# Patient Record
Sex: Female | Born: 1950
Health system: Southern US, Community
[De-identification: ages and names within clinical notes are randomized; demographics above are authoritative.]

## PROBLEM LIST (undated history)

## (undated) DIAGNOSIS — IMO0002 Reserved for concepts with insufficient information to code with codable children: Secondary | ICD-10-CM

## (undated) DIAGNOSIS — M8580 Other specified disorders of bone density and structure, unspecified site: Secondary | ICD-10-CM

## (undated) DIAGNOSIS — R0789 Other chest pain: Secondary | ICD-10-CM

## (undated) DIAGNOSIS — I82409 Acute embolism and thrombosis of unspecified deep veins of unspecified lower extremity: Secondary | ICD-10-CM

## (undated) DIAGNOSIS — M1990 Unspecified osteoarthritis, unspecified site: Secondary | ICD-10-CM

## (undated) DIAGNOSIS — K219 Gastro-esophageal reflux disease without esophagitis: Secondary | ICD-10-CM

## (undated) DIAGNOSIS — K649 Unspecified hemorrhoids: Secondary | ICD-10-CM

## (undated) DIAGNOSIS — K824 Cholesterolosis of gallbladder: Secondary | ICD-10-CM

## (undated) DIAGNOSIS — Z5189 Encounter for other specified aftercare: Secondary | ICD-10-CM

## (undated) DIAGNOSIS — R3129 Other microscopic hematuria: Secondary | ICD-10-CM

## (undated) DIAGNOSIS — T4145XA Adverse effect of unspecified anesthetic, initial encounter: Secondary | ICD-10-CM

## (undated) DIAGNOSIS — I739 Peripheral vascular disease, unspecified: Secondary | ICD-10-CM

## (undated) DIAGNOSIS — Z9889 Other specified postprocedural states: Secondary | ICD-10-CM

## (undated) DIAGNOSIS — C4491 Basal cell carcinoma of skin, unspecified: Secondary | ICD-10-CM

## (undated) DIAGNOSIS — D696 Thrombocytopenia, unspecified: Secondary | ICD-10-CM

## (undated) DIAGNOSIS — T7840XA Allergy, unspecified, initial encounter: Secondary | ICD-10-CM

## (undated) DIAGNOSIS — K602 Anal fissure, unspecified: Secondary | ICD-10-CM

## (undated) DIAGNOSIS — T8859XA Other complications of anesthesia, initial encounter: Secondary | ICD-10-CM

## (undated) DIAGNOSIS — R112 Nausea with vomiting, unspecified: Secondary | ICD-10-CM

## (undated) HISTORY — DX: Other specified disorders of bone density and structure, unspecified site: M85.80

## (undated) HISTORY — PX: LUNG BIOPSY: SHX232

## (undated) HISTORY — DX: Allergy, unspecified, initial encounter: T78.40XA

## (undated) HISTORY — DX: Thrombocytopenia, unspecified: D69.6

## (undated) HISTORY — DX: Unspecified hemorrhoids: K64.9

## (undated) HISTORY — DX: Reserved for concepts with insufficient information to code with codable children: IMO0002

## (undated) HISTORY — DX: Basal cell carcinoma of skin, unspecified: C44.91

## (undated) HISTORY — DX: Other chest pain: R07.89

## (undated) HISTORY — PX: COLONOSCOPY: SHX174

## (undated) HISTORY — PX: JOINT REPLACEMENT: SHX530

## (undated) HISTORY — DX: Cholesterolosis of gallbladder: K82.4

## (undated) HISTORY — DX: Anal fissure, unspecified: K60.2

## (undated) HISTORY — DX: Peripheral vascular disease, unspecified: I73.9

## (undated) HISTORY — DX: Encounter for other specified aftercare: Z51.89

## (undated) HISTORY — DX: Other microscopic hematuria: R31.29

## (undated) HISTORY — DX: Gastro-esophageal reflux disease without esophagitis: K21.9

---

## 2001-08-29 ENCOUNTER — Other Ambulatory Visit: Admission: RE | Admit: 2001-08-29 | Discharge: 2001-08-29 | Payer: Self-pay | Admitting: *Deleted

## 2002-09-19 ENCOUNTER — Other Ambulatory Visit: Admission: RE | Admit: 2002-09-19 | Discharge: 2002-09-19 | Payer: Self-pay | Admitting: Obstetrics & Gynecology

## 2003-10-22 ENCOUNTER — Other Ambulatory Visit: Admission: RE | Admit: 2003-10-22 | Discharge: 2003-10-22 | Payer: Self-pay | Admitting: Obstetrics & Gynecology

## 2003-11-21 ENCOUNTER — Encounter: Admission: RE | Admit: 2003-11-21 | Discharge: 2003-11-21 | Payer: Self-pay | Admitting: Family Medicine

## 2004-02-19 ENCOUNTER — Encounter: Admission: RE | Admit: 2004-02-19 | Discharge: 2004-02-19 | Payer: Self-pay | Admitting: Family Medicine

## 2004-03-21 ENCOUNTER — Ambulatory Visit (HOSPITAL_COMMUNITY): Admission: RE | Admit: 2004-03-21 | Discharge: 2004-03-21 | Payer: Self-pay | Admitting: Gastroenterology

## 2004-03-21 ENCOUNTER — Encounter: Payer: Self-pay | Admitting: Gastroenterology

## 2004-07-01 ENCOUNTER — Encounter: Admission: RE | Admit: 2004-07-01 | Discharge: 2004-07-01 | Payer: Self-pay | Admitting: *Deleted

## 2004-12-31 ENCOUNTER — Encounter: Admission: RE | Admit: 2004-12-31 | Discharge: 2004-12-31 | Payer: Self-pay | Admitting: *Deleted

## 2005-11-26 ENCOUNTER — Encounter: Admission: RE | Admit: 2005-11-26 | Discharge: 2005-11-26 | Payer: Self-pay | Admitting: *Deleted

## 2005-12-08 ENCOUNTER — Ambulatory Visit (HOSPITAL_COMMUNITY): Admission: RE | Admit: 2005-12-08 | Discharge: 2005-12-08 | Payer: Self-pay | Admitting: Family Medicine

## 2006-01-15 ENCOUNTER — Ambulatory Visit: Payer: Self-pay | Admitting: Internal Medicine

## 2006-01-21 ENCOUNTER — Ambulatory Visit (HOSPITAL_COMMUNITY): Admission: RE | Admit: 2006-01-21 | Discharge: 2006-01-21 | Payer: Self-pay | Admitting: Internal Medicine

## 2006-02-04 ENCOUNTER — Inpatient Hospital Stay (HOSPITAL_COMMUNITY)
Admission: RE | Admit: 2006-02-04 | Discharge: 2006-02-07 | Payer: Self-pay | Admitting: Thoracic Surgery (Cardiothoracic Vascular Surgery)

## 2006-02-04 ENCOUNTER — Encounter (INDEPENDENT_AMBULATORY_CARE_PROVIDER_SITE_OTHER): Payer: Self-pay | Admitting: Specialist

## 2006-02-12 ENCOUNTER — Encounter
Admission: RE | Admit: 2006-02-12 | Discharge: 2006-02-12 | Payer: Self-pay | Admitting: Thoracic Surgery (Cardiothoracic Vascular Surgery)

## 2006-08-17 ENCOUNTER — Ambulatory Visit: Payer: Self-pay | Admitting: Internal Medicine

## 2006-08-24 ENCOUNTER — Ambulatory Visit: Payer: Self-pay | Admitting: Internal Medicine

## 2006-09-15 ENCOUNTER — Ambulatory Visit: Payer: Self-pay | Admitting: Internal Medicine

## 2006-10-08 ENCOUNTER — Ambulatory Visit: Payer: Self-pay | Admitting: Internal Medicine

## 2006-10-08 LAB — CONVERTED CEMR LAB
Basophils Absolute: 0 10*3/uL (ref 0.0–0.1)
Basophils Relative: 0.3 % (ref 0.0–1.0)
Eosinophil percent: 3.1 % (ref 0.0–5.0)
HCT: 42.5 % (ref 36.0–46.0)
Hemoglobin: 14.4 g/dL (ref 12.0–15.0)
Lymphocytes Relative: 40.5 % (ref 12.0–46.0)
MCHC: 33.8 g/dL (ref 30.0–36.0)
MCV: 95.3 fL (ref 78.0–100.0)
Monocytes Absolute: 0.2 10*3/uL (ref 0.2–0.7)
Monocytes Relative: 5.4 % (ref 3.0–11.0)
Neutro Abs: 1.8 10*3/uL (ref 1.4–7.7)
Neutrophils Relative %: 50.7 % (ref 43.0–77.0)
Platelets: 164 10*3/uL (ref 150–400)
RBC: 4.46 M/uL (ref 3.87–5.11)
RDW: 12 % (ref 11.5–14.6)
WBC: 3.6 10*3/uL — ABNORMAL LOW (ref 4.5–10.5)

## 2006-11-02 ENCOUNTER — Ambulatory Visit: Payer: Self-pay | Admitting: Internal Medicine

## 2006-11-14 ENCOUNTER — Ambulatory Visit: Payer: Self-pay | Admitting: Hematology & Oncology

## 2006-11-19 ENCOUNTER — Ambulatory Visit: Payer: Self-pay | Admitting: Internal Medicine

## 2006-11-19 LAB — CONVERTED CEMR LAB
BUN: 6 mg/dL (ref 6–23)
CO2: 30 meq/L (ref 19–32)
Calcium: 9.3 mg/dL (ref 8.4–10.5)
Chloride: 104 meq/L (ref 96–112)
Creatinine, Ser: 0.7 mg/dL (ref 0.4–1.2)
GFR calc non Af Amer: 93 mL/min
Glomerular Filtration Rate, Af Am: 113 mL/min/{1.73_m2}
Glucose, Bld: 79 mg/dL (ref 70–99)
Potassium: 3.7 meq/L (ref 3.5–5.1)
Sodium: 140 meq/L (ref 135–145)

## 2006-12-08 LAB — CBC WITH DIFFERENTIAL/PLATELET
Eosinophils Absolute: 0.1 10*3/uL (ref 0.0–0.5)
LYMPH%: 37.1 % (ref 14.0–48.0)
MCV: 94 fL (ref 81.0–101.0)
MONO%: 5 % (ref 0.0–13.0)
NEUT#: 2.2 10*3/uL (ref 1.5–6.5)
NEUT%: 56.1 % (ref 39.6–76.8)
Platelets: 170 10*3/uL (ref 145–400)
RBC: 4.43 10*6/uL (ref 3.70–5.32)

## 2006-12-08 LAB — CHCC SMEAR

## 2006-12-09 LAB — RHEUMATOID FACTOR: Rhuematoid fact SerPl-aCnc: <20 IU/mL (ref 0–20)

## 2007-06-09 ENCOUNTER — Ambulatory Visit: Payer: Self-pay | Admitting: Internal Medicine

## 2007-06-09 LAB — CONVERTED CEMR LAB
BUN: 9 mg/dL (ref 6–23)
CO2: 33 meq/L — ABNORMAL HIGH (ref 19–32)
Calcium: 8.9 mg/dL (ref 8.4–10.5)
Chloride: 103 meq/L (ref 96–112)
Creatinine, Ser: 0.7 mg/dL (ref 0.4–1.2)
GFR calc Af Amer: 112 mL/min
GFR calc non Af Amer: 93 mL/min
Glucose, Bld: 82 mg/dL (ref 70–99)
Potassium: 4.1 meq/L (ref 3.5–5.1)
Sodium: 141 meq/L (ref 135–145)
Vit D, 1,25-Dihydroxy: 24 (ref 20–57)

## 2007-09-28 ENCOUNTER — Ambulatory Visit: Payer: Self-pay | Admitting: Internal Medicine

## 2007-12-01 HISTORY — PX: UPPER GASTROINTESTINAL ENDOSCOPY: SHX188

## 2008-01-05 ENCOUNTER — Encounter: Payer: Self-pay | Admitting: Internal Medicine

## 2008-01-06 ENCOUNTER — Encounter: Payer: Self-pay | Admitting: Internal Medicine

## 2008-01-12 ENCOUNTER — Ambulatory Visit: Payer: Self-pay | Admitting: Internal Medicine

## 2008-01-12 LAB — CONVERTED CEMR LAB
ALT: 14 units/L (ref 0–35)
AST: 16 units/L (ref 0–37)
Albumin: 3.9 g/dL (ref 3.5–5.2)
Alkaline Phosphatase: 36 units/L — ABNORMAL LOW (ref 39–117)
BUN: 9 mg/dL (ref 6–23)
Basophils Absolute: 0 10*3/uL (ref 0.0–0.1)
Basophils Relative: 0.3 % (ref 0.0–1.0)
Bilirubin Urine: NEGATIVE
Bilirubin, Direct: 0.1 mg/dL (ref 0.0–0.3)
CO2: 32 meq/L (ref 19–32)
Calcium: 8.9 mg/dL (ref 8.4–10.5)
Chloride: 106 meq/L (ref 96–112)
Cholesterol: 151 mg/dL (ref 0–200)
Creatinine, Ser: 0.7 mg/dL (ref 0.4–1.2)
Eosinophils Absolute: 0.1 10*3/uL (ref 0.0–0.6)
Eosinophils Relative: 2.8 % (ref 0.0–5.0)
GFR calc Af Amer: 112 mL/min
GFR calc non Af Amer: 93 mL/min
Glucose, Bld: 85 mg/dL (ref 70–99)
HCT: 42.5 % (ref 36.0–46.0)
HDL: 46.9 mg/dL (ref >39.0)
Hemoglobin, Urine: NEGATIVE
Hemoglobin: 14.3 g/dL (ref 12.0–15.0)
Ketones, ur: NEGATIVE mg/dL
LDL Cholesterol: 95 mg/dL (ref 0–99)
Leukocytes, UA: NEGATIVE
Lymphocytes Relative: 35.3 % (ref 12.0–46.0)
MCHC: 33.7 g/dL (ref 30.0–36.0)
MCV: 96.5 fL (ref 78.0–100.0)
Monocytes Absolute: 0.2 10*3/uL (ref 0.2–0.7)
Monocytes Relative: 5 % (ref 3.0–11.0)
Neutro Abs: 2.4 10*3/uL (ref 1.4–7.7)
Neutrophils Relative %: 56.6 % (ref 43.0–77.0)
Nitrite: NEGATIVE
Platelets: 150 10*3/uL (ref 150–400)
Potassium: 4.3 meq/L (ref 3.5–5.1)
RBC: 4.41 M/uL (ref 3.87–5.11)
RDW: 12.3 % (ref 11.5–14.6)
Sodium: 142 meq/L (ref 135–145)
Specific Gravity, Urine: 1.01 (ref 1.000–1.03)
TSH: 1.5 microintl units/mL (ref 0.35–5.50)
Total Bilirubin: 0.8 mg/dL (ref 0.3–1.2)
Total CHOL/HDL Ratio: 3.2
Total Protein, Urine: NEGATIVE mg/dL
Total Protein: 6.3 g/dL (ref 6.0–8.3)
Triglycerides: 44 mg/dL (ref 0–149)
Urine Glucose: NEGATIVE mg/dL
Urobilinogen, UA: 0.2 (ref 0.0–1.0)
VLDL: 9 mg/dL (ref 0–40)
WBC: 4.1 10*3/uL — ABNORMAL LOW (ref 4.5–10.5)
pH: 7 (ref 5.0–8.0)

## 2008-01-18 ENCOUNTER — Ambulatory Visit: Payer: Self-pay | Admitting: Internal Medicine

## 2008-01-18 DIAGNOSIS — H8110 Benign paroxysmal vertigo, unspecified ear: Secondary | ICD-10-CM | POA: Insufficient documentation

## 2008-01-19 LAB — CONVERTED CEMR LAB: Vit D, 1,25-Dihydroxy: 32 (ref 30–89)

## 2008-02-01 ENCOUNTER — Encounter: Payer: Self-pay | Admitting: Internal Medicine

## 2008-02-21 LAB — CONVERTED CEMR LAB: Pap Smear: NORMAL

## 2008-07-09 ENCOUNTER — Telehealth: Payer: Self-pay | Admitting: Internal Medicine

## 2008-07-10 ENCOUNTER — Ambulatory Visit: Payer: Self-pay | Admitting: Internal Medicine

## 2008-07-10 DIAGNOSIS — K219 Gastro-esophageal reflux disease without esophagitis: Secondary | ICD-10-CM | POA: Insufficient documentation

## 2008-08-14 ENCOUNTER — Ambulatory Visit: Payer: Self-pay | Admitting: Gastroenterology

## 2008-08-22 ENCOUNTER — Ambulatory Visit: Payer: Self-pay | Admitting: Gastroenterology

## 2008-08-22 ENCOUNTER — Encounter: Payer: Self-pay | Admitting: Internal Medicine

## 2008-08-22 ENCOUNTER — Encounter: Payer: Self-pay | Admitting: Gastroenterology

## 2008-08-23 ENCOUNTER — Encounter: Payer: Self-pay | Admitting: Gastroenterology

## 2008-09-18 ENCOUNTER — Ambulatory Visit: Payer: Self-pay | Admitting: Internal Medicine

## 2008-09-18 DIAGNOSIS — M85852 Other specified disorders of bone density and structure, left thigh: Secondary | ICD-10-CM | POA: Insufficient documentation

## 2008-11-15 ENCOUNTER — Ambulatory Visit: Payer: Self-pay | Admitting: Internal Medicine

## 2008-11-15 ENCOUNTER — Encounter: Payer: Self-pay | Admitting: Internal Medicine

## 2008-12-07 ENCOUNTER — Encounter: Payer: Self-pay | Admitting: Internal Medicine

## 2009-01-03 ENCOUNTER — Ambulatory Visit: Payer: Self-pay | Admitting: Internal Medicine

## 2009-01-03 ENCOUNTER — Telehealth: Payer: Self-pay | Admitting: Gastroenterology

## 2009-01-10 ENCOUNTER — Ambulatory Visit: Payer: Self-pay | Admitting: Cardiology

## 2009-01-10 ENCOUNTER — Encounter: Payer: Self-pay | Admitting: Cardiology

## 2009-01-10 DIAGNOSIS — R079 Chest pain, unspecified: Secondary | ICD-10-CM | POA: Insufficient documentation

## 2009-01-10 LAB — CONVERTED CEMR LAB
BUN: 10 mg/dL (ref 6–23)
Basophils Absolute: 0 10*3/uL (ref 0.0–0.1)
Basophils Relative: 0 % (ref 0.0–3.0)
CO2: 30 meq/L (ref 19–32)
Calcium: 9.1 mg/dL (ref 8.4–10.5)
Chloride: 103 meq/L (ref 96–112)
Creatinine, Ser: 0.6 mg/dL (ref 0.4–1.2)
Eosinophils Absolute: 0.1 10*3/uL (ref 0.0–0.7)
Eosinophils Relative: 1.1 % (ref 0.0–5.0)
GFR calc Af Amer: 133 mL/min
GFR calc non Af Amer: 110 mL/min
Glucose, Bld: 96 mg/dL (ref 70–99)
HCT: 42.1 % (ref 36.0–46.0)
Hemoglobin: 14.7 g/dL (ref 12.0–15.0)
INR: 1 (ref 0.8–1.0)
Lymphocytes Relative: 46.3 % — ABNORMAL HIGH (ref 12.0–46.0)
MCHC: 34.9 g/dL (ref 30.0–36.0)
MCV: 96 fL (ref 78.0–100.0)
Monocytes Absolute: 0.3 10*3/uL (ref 0.1–1.0)
Monocytes Relative: 5.3 % (ref 3.0–12.0)
Neutro Abs: 2.5 10*3/uL (ref 1.4–7.7)
Neutrophils Relative %: 47.3 % (ref 43.0–77.0)
Platelets: 142 10*3/uL — ABNORMAL LOW (ref 150–400)
Potassium: 3.9 meq/L (ref 3.5–5.1)
Prothrombin Time: 11 s (ref 10.9–13.3)
RBC: 4.39 M/uL (ref 3.87–5.11)
RDW: 12.3 % (ref 11.5–14.6)
Sodium: 141 meq/L (ref 135–145)
WBC: 5.3 10*3/uL (ref 4.5–10.5)

## 2009-01-11 ENCOUNTER — Telehealth: Payer: Self-pay | Admitting: Gastroenterology

## 2009-01-11 ENCOUNTER — Ambulatory Visit: Payer: Self-pay | Admitting: Cardiovascular Disease

## 2009-01-11 ENCOUNTER — Inpatient Hospital Stay (HOSPITAL_BASED_OUTPATIENT_CLINIC_OR_DEPARTMENT_OTHER): Admission: RE | Admit: 2009-01-11 | Discharge: 2009-01-11 | Payer: Self-pay | Admitting: Cardiovascular Disease

## 2009-01-14 ENCOUNTER — Ambulatory Visit: Payer: Self-pay | Admitting: Gastroenterology

## 2009-01-14 DIAGNOSIS — R1013 Epigastric pain: Secondary | ICD-10-CM | POA: Insufficient documentation

## 2009-01-16 ENCOUNTER — Ambulatory Visit (HOSPITAL_COMMUNITY): Admission: RE | Admit: 2009-01-16 | Discharge: 2009-01-16 | Payer: Self-pay | Admitting: Gastroenterology

## 2009-01-16 ENCOUNTER — Encounter: Payer: Self-pay | Admitting: Gastroenterology

## 2009-01-16 ENCOUNTER — Ambulatory Visit: Payer: Self-pay | Admitting: Internal Medicine

## 2009-01-16 LAB — CONVERTED CEMR LAB
CRP, High Sensitivity: <1 — ABNORMAL LOW (ref 0.00–5.00)
Cholesterol: 173 mg/dL (ref 0–200)
HDL: 49.9 mg/dL (ref >39.0)
LDL Cholesterol: 115 mg/dL — ABNORMAL HIGH (ref 0–99)
Tissue Transglutaminase Ab, IgA: 0.2 units (ref <7)
Total CHOL/HDL Ratio: 3.5
Triglycerides: 40 mg/dL (ref 0–149)
VLDL: 8 mg/dL (ref 0–40)
Vit D, 25-Hydroxy: 45 ng/mL (ref 30–89)

## 2009-01-21 ENCOUNTER — Telehealth (INDEPENDENT_AMBULATORY_CARE_PROVIDER_SITE_OTHER): Payer: Self-pay | Admitting: *Deleted

## 2009-01-21 ENCOUNTER — Ambulatory Visit: Payer: Self-pay

## 2009-01-21 LAB — CONVERTED CEMR LAB
ALT: 9 units/L (ref 0–35)
AST: 17 units/L (ref 0–37)
Albumin: 4.2 g/dL (ref 3.5–5.2)
Alkaline Phosphatase: 47 units/L (ref 39–117)
BUN: 10 mg/dL (ref 6–23)
Basophils Absolute: 0 10*3/uL (ref 0.0–0.1)
Basophils Relative: 0.1 % (ref 0.0–3.0)
CO2: 33 meq/L — ABNORMAL HIGH (ref 19–32)
Calcium: 9.2 mg/dL (ref 8.4–10.5)
Chloride: 103 meq/L (ref 96–112)
Creatinine, Ser: 0.6 mg/dL (ref 0.4–1.2)
Eosinophils Absolute: 0 10*3/uL (ref 0.0–0.7)
Eosinophils Relative: 1.1 % (ref 0.0–5.0)
GFR calc Af Amer: 133 mL/min
GFR calc non Af Amer: 110 mL/min
Glucose, Bld: 86 mg/dL (ref 70–99)
HCT: 42.5 % (ref 36.0–46.0)
Hemoglobin: 15.2 g/dL — ABNORMAL HIGH (ref 12.0–15.0)
IgA: 180 mg/dL (ref 68–378)
Lymphocytes Relative: 38.9 % (ref 12.0–46.0)
MCHC: 35.7 g/dL (ref 30.0–36.0)
MCV: 94.7 fL (ref 78.0–100.0)
Monocytes Absolute: 0.2 10*3/uL (ref 0.1–1.0)
Monocytes Relative: 5 % (ref 3.0–12.0)
Neutro Abs: 2.2 10*3/uL (ref 1.4–7.7)
Neutrophils Relative %: 54.9 % (ref 43.0–77.0)
Platelets: 142 10*3/uL — ABNORMAL LOW (ref 150–400)
Potassium: 4.1 meq/L (ref 3.5–5.1)
RBC: 4.49 M/uL (ref 3.87–5.11)
RDW: 11.9 % (ref 11.5–14.6)
Sed Rate: 12 mm/hr (ref 0–22)
Sodium: 142 meq/L (ref 135–145)
TSH: 1.57 microintl units/mL (ref 0.35–5.50)
Total Bilirubin: 0.8 mg/dL (ref 0.3–1.2)
Total Protein: 7.1 g/dL (ref 6.0–8.3)
WBC: 4 10*3/uL — ABNORMAL LOW (ref 4.5–10.5)

## 2009-02-05 ENCOUNTER — Encounter: Payer: Self-pay | Admitting: Cardiology

## 2009-02-05 ENCOUNTER — Ambulatory Visit: Payer: Self-pay | Admitting: Cardiology

## 2009-03-06 ENCOUNTER — Ambulatory Visit: Payer: Self-pay | Admitting: Gastroenterology

## 2009-03-09 LAB — CONVERTED CEMR LAB
Basophils Absolute: 0 10*3/uL (ref 0.0–0.1)
Basophils Relative: 0.2 % (ref 0.0–3.0)
Eosinophils Absolute: 0.1 10*3/uL (ref 0.0–0.7)
Eosinophils Relative: 1 % (ref 0.0–5.0)
HCT: 39.2 % (ref 36.0–46.0)
Hemoglobin: 13.4 g/dL (ref 12.0–15.0)
Lymphocytes Relative: 37.6 % (ref 12.0–46.0)
Lymphs Abs: 1.9 10*3/uL (ref 0.7–4.0)
MCHC: 34.2 g/dL (ref 30.0–36.0)
MCV: 96.6 fL (ref 78.0–100.0)
Monocytes Absolute: 0.3 10*3/uL (ref 0.1–1.0)
Monocytes Relative: 5 % (ref 3.0–12.0)
Neutro Abs: 2.8 10*3/uL (ref 1.4–7.7)
Neutrophils Relative %: 56.2 % (ref 43.0–77.0)
Platelets: 166 10*3/uL (ref 150.0–400.0)
RBC: 4.05 M/uL (ref 3.87–5.11)
RDW: 12.2 % (ref 11.5–14.6)
WBC: 5.1 10*3/uL (ref 4.5–10.5)

## 2009-03-11 ENCOUNTER — Ambulatory Visit: Payer: Self-pay | Admitting: Gastroenterology

## 2009-03-19 LAB — CONVERTED CEMR LAB: Pap Smear: NORMAL

## 2009-06-11 ENCOUNTER — Telehealth: Payer: Self-pay | Admitting: Gastroenterology

## 2009-10-08 ENCOUNTER — Telehealth: Payer: Self-pay | Admitting: Internal Medicine

## 2009-11-05 ENCOUNTER — Ambulatory Visit: Payer: Self-pay | Admitting: Internal Medicine

## 2009-11-05 LAB — CONVERTED CEMR LAB
ALT: 13 units/L (ref 0–35)
AST: 20 units/L (ref 0–37)
Albumin: 4 g/dL (ref 3.5–5.2)
Alkaline Phosphatase: 41 units/L (ref 39–117)
BUN: 7 mg/dL (ref 6–23)
Basophils Absolute: 0 10*3/uL (ref 0.0–0.1)
Basophils Relative: 0.3 % (ref 0.0–3.0)
Bilirubin Urine: NEGATIVE
Bilirubin, Direct: 0.2 mg/dL (ref 0.0–0.3)
CO2: 32 meq/L (ref 19–32)
Calcium: 9.2 mg/dL (ref 8.4–10.5)
Chloride: 107 meq/L (ref 96–112)
Cholesterol: 166 mg/dL (ref 0–200)
Creatinine, Ser: 0.8 mg/dL (ref 0.4–1.2)
Eosinophils Absolute: 0 10*3/uL (ref 0.0–0.7)
Eosinophils Relative: 1.1 % (ref 0.0–5.0)
GFR calc non Af Amer: 78.74 mL/min (ref >60)
Glucose, Bld: 88 mg/dL (ref 70–99)
HCT: 42.3 % (ref 36.0–46.0)
HDL: 53.7 mg/dL (ref >39.00)
Hemoglobin, Urine: NEGATIVE
Hemoglobin: 14.2 g/dL (ref 12.0–15.0)
Ketones, ur: NEGATIVE mg/dL
LDL Cholesterol: 105 mg/dL — ABNORMAL HIGH (ref 0–99)
Leukocytes, UA: NEGATIVE
Lymphocytes Relative: 35 % (ref 12.0–46.0)
Lymphs Abs: 1.4 10*3/uL (ref 0.7–4.0)
MCHC: 33.7 g/dL (ref 30.0–36.0)
MCV: 97.9 fL (ref 78.0–100.0)
Monocytes Absolute: 0.3 10*3/uL (ref 0.1–1.0)
Monocytes Relative: 7.5 % (ref 3.0–12.0)
Neutro Abs: 2.2 10*3/uL (ref 1.4–7.7)
Neutrophils Relative %: 56.1 % (ref 43.0–77.0)
Nitrite: NEGATIVE
Platelets: 129 10*3/uL — ABNORMAL LOW (ref 150.0–400.0)
Potassium: 4.3 meq/L (ref 3.5–5.1)
RBC: 4.32 M/uL (ref 3.87–5.11)
RDW: 12 % (ref 11.5–14.6)
Sodium: 145 meq/L (ref 135–145)
Specific Gravity, Urine: <=1.005 (ref 1.000–1.030)
TSH: 1.59 microintl units/mL (ref 0.35–5.50)
Total Bilirubin: 0.8 mg/dL (ref 0.3–1.2)
Total CHOL/HDL Ratio: 3
Total Protein, Urine: NEGATIVE mg/dL
Total Protein: 6.8 g/dL (ref 6.0–8.3)
Triglycerides: 36 mg/dL (ref 0.0–149.0)
Urine Glucose: NEGATIVE mg/dL
Urobilinogen, UA: 0.2 (ref 0.0–1.0)
VLDL: 7.2 mg/dL (ref 0.0–40.0)
Vit D, 25-Hydroxy: 43 ng/mL (ref 30–89)
WBC: 3.9 10*3/uL — ABNORMAL LOW (ref 4.5–10.5)
pH: 7 (ref 5.0–8.0)

## 2009-11-13 ENCOUNTER — Ambulatory Visit: Payer: Self-pay | Admitting: Internal Medicine

## 2009-11-13 DIAGNOSIS — D696 Thrombocytopenia, unspecified: Secondary | ICD-10-CM | POA: Insufficient documentation

## 2010-02-06 ENCOUNTER — Ambulatory Visit: Payer: Self-pay | Admitting: Internal Medicine

## 2010-02-06 DIAGNOSIS — M79609 Pain in unspecified limb: Secondary | ICD-10-CM | POA: Insufficient documentation

## 2010-03-12 ENCOUNTER — Ambulatory Visit: Payer: Self-pay | Admitting: Internal Medicine

## 2010-03-12 LAB — CONVERTED CEMR LAB
Basophils Absolute: 0 10*3/uL (ref 0.0–0.1)
Basophils Relative: 0.5 % (ref 0.0–3.0)
Eosinophils Absolute: 0 10*3/uL (ref 0.0–0.7)
Eosinophils Relative: 1.4 % (ref 0.0–5.0)
HCT: 40.9 % (ref 36.0–46.0)
Hemoglobin: 14.2 g/dL (ref 12.0–15.0)
Lymphocytes Relative: 43.8 % (ref 12.0–46.0)
Lymphs Abs: 1.4 10*3/uL (ref 0.7–4.0)
MCHC: 34.6 g/dL (ref 30.0–36.0)
MCV: 95.5 fL (ref 78.0–100.0)
Monocytes Absolute: 0.2 10*3/uL (ref 0.1–1.0)
Monocytes Relative: 5.3 % (ref 3.0–12.0)
Neutro Abs: 1.5 10*3/uL (ref 1.4–7.7)
Neutrophils Relative %: 49 % (ref 43.0–77.0)
Platelets: 144 10*3/uL — ABNORMAL LOW (ref 150.0–400.0)
RBC: 4.28 M/uL (ref 3.87–5.11)
RDW: 13.1 % (ref 11.5–14.6)
Vitamin B-12: 337 pg/mL (ref 211–911)
WBC: 3.2 10*3/uL — ABNORMAL LOW (ref 4.5–10.5)

## 2010-03-24 ENCOUNTER — Telehealth: Payer: Self-pay | Admitting: Internal Medicine

## 2010-12-28 LAB — CONVERTED CEMR LAB: Pap Smear: NORMAL

## 2010-12-31 ENCOUNTER — Telehealth: Payer: Self-pay | Admitting: Internal Medicine

## 2011-01-01 NOTE — Letter (Signed)
   [place] [facility] [address] [place], [place]  [address]  [place] Phone: [phone]      March 24, 2010   Elizabeth Douglas [address] [place], [place] [address]  RE:  LAB RESULTS  Dear  Ms. Estey,  The following is an interpretation of your most recent lab tests.  Please take note of any instructions provided or changes to medications that have resulted from your lab work.     CBC:  Stable - no changes needed  B12  level:  normal       Sincerely Yours,    Dr. Thomos Lemons

## 2011-01-01 NOTE — Assessment & Plan Note (Signed)
Summary: Right Leg hurts, ? Vein issues- jr   Vital Signs:  Patient profile:   60 year old female Height:      67 inches Weight:      150 pounds BMI:     23.58 O2 Sat:      99 % on Room air Temp:     98.0 degrees F oral Pulse rate:   68 / minute Pulse rhythm:   regular Resp:     16 per minute BP sitting:   98 / 60  (right arm) Cuff size:   regular  Vitals Entered By: Glendell Docker CMA (February 06, 2010 11:19 AM)  O2 Flow:  Room air CC: Rm 3- RIght leg pain   Primary Care Provider:  DThomos Lemons DO  CC:  Rm 3- RIght leg pain.  History of Present Illness: 60 y/o white female c/o right leg pain, lower calf from the knee down for the past 2 month  leg feels numb and tingling, hurts more when sitting, cramping sensation on the side of leg and wakes her up at night no low back pain no  lower ext swelling or redness  Allergies (verified): No Known Drug Allergies  Past History:  Past Medical History: Vitamin D deficiency Basal cell carcinoma  Microhematuria   GERD - EGD showed chronic gastritis 08/22/2008 Osteopenia - DEXA 2007 T score  -2.148 (left hip)  Atypical chest pain    February/2010-cardiac catheterization      1. Normal coronary arteries.      2. Normal left ventricular function.     Past Surgical History: lung biopsy, March 2007 fatty tissue was removed      Family History: Mother deceased at age 30 with COPD.  Father deceased at age 70 secondary to prostate cancer.  The patient has a brother who has had an MI at age 42 and another brother who is noted to have Hodgkin's and also non-Hodgkin's lymphoma, and coronary artery disease.  He is currently 58 years old.   Osteoporosis - mother      Social History: Occupation: unemployed from [employer] Married Daughter 38 years old. Never Smoked  Alcohol use-no        Physical Exam  General:  alert, well-developed, and well-nourished.   Lungs:  normal respiratory effort, normal breath sounds,  no crackles, and no wheezes.   Heart:  normal rate, regular rhythm, no murmur, and no gallop.   Pulses:  dorsalis pedis and posterior tibial pulses are full and equal bilaterally Extremities:  no calf swelling or calf tenderness   Impression & Recommendations:  Problem # 1:  LEG PAIN, RIGHT (ICD-729.5) 60 y/o white female with intermittent right leg pain.  I suspect pain from ilieo tibial band syndrome.   she may also have mild nerve entrapment as right fibular head.   we discussed stretching exercises and use of otc analgesics. Patient advised to call office if symptoms persist or worsen.  Complete Medication List: 1)  Estrace 0.1 Mg/gm Crea (Estradiol) .... Apply vaginallay three times weekly 2)  Citracal Plus Tabs (Multiple minerals-vitamins) .... Take 1 tablet by mouth two times a day 3)  Nexium 40 Mg Cpdr (Esomeprazole magnesium) .... One by mouth by mouth once daily 4)  Vitamin D3 2000 Unit Tabs (cholecalciferol)  .Marland Kitchen.. 1 by mouth once daily  Current Allergies (reviewed today): No known allergies

## 2011-01-01 NOTE — Progress Notes (Signed)
Summary: Lab Results  Phone Note Call from Patient Call back at Home Phone [phone]   Caller: Patient Reason for Call: Lab or Test Results Summary of Call: patient called and left voice message requesting results of her lab test  Initial call taken by: Glendell Docker CMA,  March 24, 2010 2:14 PM  Follow-up for Phone Call        blood counts and b12 level normal.  I will also send lab letter Follow-up by: D. Thomos Lemons DO,  March 24, 2010 2:27 PM  Additional Follow-up for Phone Call Additional follow up Details #1::        SW pt she would a copy of the labs with the letter Lannette Donath  March 24, 2010 2:30 PM    Additional Follow-up for Phone Call Additional follow up Details #2::    copy of labs mailed to patient per request Follow-up by: Glendell Docker CMA,  March 24, 2010 2:47 PM

## 2011-01-07 NOTE — Progress Notes (Signed)
Summary: Nexium Refill  Phone Note Refill Request Message from:  Fax from Pharmacy on December 31, 2010 3:41 PM  Refills Requested: Medication #1:  NEXIUM 40 MG  CPDR one by mouth by mouth once daily   Dosage confirmed as above?Dosage Confirmed   Brand Name Necessary? No   Supply Requested: 3 months   Last Refilled: 10/07/2010 [facility] pharmacy [address] [place], [place] [address] fax [phone]   Method Requested: Electronic Next Appointment Scheduled: none Initial call taken by: Elba Barman,  December 31, 2010 3:42 PM    Prescriptions: NEXIUM 40 MG  CPDR (ESOMEPRAZOLE MAGNESIUM) one by mouth by mouth once daily  #90 x 0   Entered by:   Glendell Docker CMA   Authorized by:   D. Thomos Lemons DO   Signed by:   Glendell Docker CMA on 01/01/2011   Method used:   Electronically to        [facility] [phone]* (retail)       [address] [place], [place]  [address]       Ph: [phone] or [phone]       Fax: [phone]   RxID:   [id number]

## 2011-02-24 ENCOUNTER — Encounter: Payer: Self-pay | Admitting: Gastroenterology

## 2011-02-24 ENCOUNTER — Ambulatory Visit (INDEPENDENT_AMBULATORY_CARE_PROVIDER_SITE_OTHER): Payer: BC Managed Care – PPO | Admitting: Gastroenterology

## 2011-02-24 VITALS — BP 124/78 | HR 64 | Wt 158.0 lb

## 2011-02-24 DIAGNOSIS — K219 Gastro-esophageal reflux disease without esophagitis: Secondary | ICD-10-CM

## 2011-02-24 NOTE — Progress Notes (Signed)
Review of gastrointestinal problems:  1. GERD: EGD 2005 (Dr. Ewing Schlein) this was normal. EGD 9/09 Christella Hartigan), mild-nonspec gastritis, H. pylori Negative. Perhaps Contributing to intermittent chest discomforts. Cardiac workup 2010 negative. Symptoms improved with Carafate.  01/2011  2. Routine risk fo colon cancer: Colonoscopy 2005 (Dr. Ewing Schlein) this was normal.   HPI: This is a very pleasant 60 yo woman.  Much better.  75% of the time she is doing very well.  She has pains in chest, a swollen feeling especially at night.  No real acid taste in mouth.  More often at night.   Takes nexium 30 min before bf meal.    Has gained about 10-15 pounds in past 2 years.  Has never doubled up on ppi.  Uses maalox PRN with very good effect usually.  Has never tried h2 blockers  No dysphagia.  No overt GI bleeding.   Physical Exam: Vital signs from this visit reviewed Constitutional: generally well-appearing Psychiatric: alert and oriented x3 Abdomen: soft, nontender, nondistended, no obvious ascites, no peritoneal signs, normal bowel sounds   Assessment and plan:   GERD, GERD related dyspepsia  Her symptoms improve with Maalox , generally occur at night. Nocturnal symptoms such as this are often well treated with H2 blockers at bedtime. She will continue her Nexium in the morning. She will be given a GERD handout. I would like her to call here in 5-6 weeks to report on her symptoms.

## 2011-02-24 NOTE — Patient Instructions (Signed)
Trial of zantac/pepcid at bedtime. GERD handout given. Call Dr. Christella Hartigan' office in 5-6 weeks to report on your symptoms Stay on nexium every morning.

## 2011-03-30 ENCOUNTER — Other Ambulatory Visit: Payer: Self-pay | Admitting: Gastroenterology

## 2011-03-30 ENCOUNTER — Other Ambulatory Visit: Payer: Self-pay

## 2011-03-30 DIAGNOSIS — K219 Gastro-esophageal reflux disease without esophagitis: Secondary | ICD-10-CM

## 2011-03-30 MED ORDER — ESOMEPRAZOLE MAGNESIUM 40 MG PO CPDR: 40.0000 mg | DELAYED_RELEASE_CAPSULE | Freq: Every day | ORAL | Status: DC

## 2011-03-30 NOTE — Telephone Encounter (Signed)
rx sent pt aware 

## 2011-03-30 NOTE — Telephone Encounter (Signed)
Pharmacy request for refill approved and  sent

## 2011-04-14 NOTE — Cardiovascular Report (Signed)
NAMECHEREESE, CILENTO NO.:  [id number]   MEDICAL RECORD NO.:  [id number]           PATIENT TYPE:   LOCATION:                                 FACILITY:   PHYSICIAN:  Veverly Fells. Excell Seltzer, MD  DATE OF BIRTH:  [birth year]   DATE OF PROCEDURE:  DATE OF DISCHARGE:                            CARDIAC CATHETERIZATION   PROCEDURE:  Left heart catheterization, selective coronary angiography,  left ventricular angiography.   INDICATIONS:  Ms. Ellegood is a 60 year old woman with multiple cardiac  risk factors who presented with exertional chest pain.  She was referred  directly for cardiac catheterization because of high pretest probability  of CAD.   Risks and indications of the procedure were reviewed with the patient.  Informed consent was obtained.  The right groin was prepped, draped, and  anesthetized with 1% lidocaine.  Using modified Seldinger technique, a 4-  French sheath was placed in the right femoral artery.  Standard 4-French  Judkins catheters were used for coronary angiography and left  ventriculography.  An angled pigtail catheter was used for left  ventriculography.  This was pulled back across the aortic valve to  measure pressure gradients.  The patient tolerated the procedure well.  All catheter exchanges were performed over a guidewire.  There were no  immediate complications.   FINDINGS:  1. Hemodynamics:  Aortic pressure 111/71 with a mean of 90, left      ventricular pressure 112/9.  2. Coronary angiography:  Left mainstem angiographically normal.  It      bifurcates into the LAD and left circumflex.  3. LAD.  LAD is a large-caliber vessel that courses down and reaches      the LV apex.  It is a tortuous vessel that supplies a large second      diagonal branch.  The first diagonal is small.  There is no      significant stenosis throughout the LAD or its diagonal branch      vessels.  4. Left circumflex:  The left circumflex courses down and  supplies a      small first OM and moderate-sized second and third OM branches.      The left circumflex is smooth throughout its course with no      evidence of obstructive CAD.  5. Right coronary artery:  The RCA is dominant.  It is a moderate-      sized vessel that supplies a small PDA branch and 2 small      posterolateral branches.  There is no significant stenosis      throughout.  6. Left ventriculography:  LVEF is estimated at 65%.  There are no      regional wall motion abnormalities identified.  There is no mitral      regurgitation.   ASSESSMENT:  1. Normal coronary arteries.  2. Normal left ventricular function.      Veverly Fells. Excell Seltzer, MD  Electronically Signed     MDC/MEDQ  D:  01/11/2009  T:  01/12/2009  Job:  [id number]   cc:   Madolyn Frieze. Crenshaw,  MD, Park Pope D. Artist Pais, DO

## 2011-04-17 NOTE — Op Note (Signed)
NAMELAURISA, SAHAKIAN NO.:  [id number]   MEDICAL RECORD NO.:  [id number]          PATIENT TYPE:  INP   LOCATION:  2041                         FACILITY:  [facility]   PHYSICIAN:  Salvatore Decent. Dorris Fetch, M.D.DATE OF BIRTH:  10-Jun-1951   DATE OF PROCEDURE:  02/04/2006  DATE OF DISCHARGE:                                 OPERATIVE REPORT   PREOPERATIVE DIAGNOSIS:  Right lower lobe nodule.   POSTOPERATIVE DIAGNOSIS:  Fatty nodule on diaphragm.   PROCEDURE:  Right video assisted thoracoscopic excision of fatty nodule from  the diaphragm.   SURGEON:  Salvatore Decent. Dorris Fetch, M.D.   ASSISTANT:  Jerold Coombe, P.A.-C.   ANESTHESIA:  General.   FINDINGS:  No palpable nodules within normal lung.  A 1.5 cm fatty nodular  lesion on the dome of diaphragm, no diaphragmatic defect.   CLINICAL NOTE:  Ms. Rothbauer is a 60 year old female who was sent for workup  for hematuria in 2004.  At that time, a CT scan was done that showed a  possible nodule in the midportion of the right lower lobe adjacent to the  dome of the diaphragm.  This was subsequently followed with serial CT scans  and has slowly growing over time.  PET scan showed no evidence of  hypermetabolic activity. In discussion with the patient, since this area had  grown, it would be necessary to continue to follow CT scans versus surgical  excision.  The patient understood the indications, risks, benefits and  alternative and she wished to proceed with surgery for definitive diagnosis  and treatment.  She understood and accepted the risks and agreed to proceed.   OPERATIVE NOTE:  Ms. Knabe was brought the preop holding area on February 04, 2006.  There, intravenous antibiotics were administered and arterial blood  pressure monitoring line was placed.  PAS hose were placed for DVT  prophylaxis.  The patient was taken to the operating room, anesthetized and  intubated with a double lumen endotracheal tube. She was placed  in left  lateral decubitus position after placement of a Foley catheter.  As noted,  PAS hose were in place.  She was placed in the left lateral decubitus  position and the right chest was prepped and draped in usual fashion.  Single lung ventilation was carried out of the left lung, the right lung was  deflated.  The patient tolerated this well throughout the procedure.   An incision was made in approximately the seventh intercostal space in the  mid axillary line.  This was carried through the skin and subcutaneous  tissue.  The chest was entered bluntly using a hemostat.  The port was  inserted and the thoracoscope was placed through the port.  Additional  incisions were made in the anterior and posterior axillary lines to enter  the space above.  The initial inspection of the lateral and superior aspects  of the parietal pleural was unremarkable. The lung itself appeared  unremarkable. Inspection of the inferior aspect of the right lower lobe  revealed no abnormalities of the visceral pleura. The lung was  gently  grasped and palpation of the right lower lobe was carried out. There were no  palpable nodules within the right lower lobe. While retracting the right  lower lobe upward, the diaphragm was inspected.  There was 1.5 cm fatty  nodule arising from the dome of the diaphragm, this appeared to be a fat  pad, was round and globular in shape but did not appear to be a tumor per  say.  Using electrocautery, the fat pad was gently grasped and taken off the  diaphragm. There was no defect in the diaphragm itself.  The lesion was  removed and sent for permanent pathology. The lung was once again palpated.  Again, no nodules were identified.  A 20-French red rubber catheter was  placed through one of the incisions.  The lung was reinflated. The remaining  incisions were closed with a #1 Vicryl fascial suture and a 3-0 Vicryl  subcuticular suture after inflating the right lung.  Suction was  applied to  the red rubber catheter and this was withdrawn and the final incision was  closed.  0.5% Marcaine was infiltrated in the incisions for local  anesthetic. All sponge, needle and instrument counts were correct at the end  of the procedure. The patient tolerated procedure well and was taken from  the operating room to the postanesthetic care unit in stable condition.           ______________________________  Salvatore Decent Dorris Fetch, M.D.     SCH/MEDQ  D:  02/04/2006  T:  02/04/2006  Job:  [id number]   cc:   Joni Fears D. Maple Hudson, M.D.  [facility] Dept  [address]  [place]  [place] [address]   Caryl Comes. Slotnick, M.D.  Fax: [phone]   Ernestina Penna, M.D.  Fax: [phone]

## 2011-04-17 NOTE — Assessment & Plan Note (Signed)
[facility]                             PRIMARY CARE OFFICE NOTE   Elizabeth Douglas, Elizabeth Douglas                       MRN:          [id number]  DATE:08/24/2006                            DOB:          18-Aug-1951    CHIEF COMPLAINT:  New patient to practice.   HISTORY OF PRESENT ILLNESS:  The patient is a 60 year old white female here  to establish primary care.  She has not had a primary care physician in the  area, but has seen Dr. Vernon Prey in [place], [place].  Due to  scheduling issues, she would like to establish in [place].   PAST MEDICAL HISTORY:  Significant for a pulmonary/diaphragmatic nodule for  which she has seen Dr. Fannie Knee in the past.  She had a followup PET  scan, which was negative, but due to possible increase in size she was  referred for VATS procedure.  This was performed by Dr. Charlett Lango,  a video-assisted thoracoscopic surgery was done, and ultimately a fatty  nodule was excised from the diaphragm.  Pathology reported as fatty tissue.   She has not had any long-term sequelae from her excision of fatty tissue and  has returned to her normal health.   Her other history is notable for microhematuria.  Was referred to a  urologist by her previous physician.  CAT scan was performed, which  incidentally picked up the right diaphragmatic lesion, but apparently did  not show any evidence of kidney lesion.  She also underwent cystoscopy and  was found not to have any bladder tumor or bladder masses.   She denies any history of high blood pressure or coronary artery disease.  No diabetes.   PAST MEDICAL HISTORY SUMMARY:  1. History of fatty nodule on diaphragm status post VATS procedure.  2. History of basal cell skin cancer followed by dermatologist.  3. History of microhematuria, presumed benign, status post workup.  4. History of acid reflux.   CURRENT MEDICATIONS:  1. Black Cohosh over-the-counter supplement  1 a day.  2. Caltrate D 600 mg 1 to 2 tablets a day.   ALLERGIES TO MEDICATIONS:  None known.   SOCIAL HISTORY:  The patient is married.  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  Has a daughter.   FAMILY HISTORY:  Mother deceased at age 30 with COPD.  Father deceased at  age 63 secondary to prostate cancer.  The patient has a brother who has had  an MI at age 45 and another brother who is noted to have Hodgkin's and also  non-Hodgkin's lymphoma, and coronary artery disease.  He is currently 58  years old.   HABITS:  She does not drink or smoke.  Has not smoked in the past.   PREVENTATIVE CARE HISTORY:  Her last Pap was in April of 2007.  Her last  mammogram was in April of 2007.  Her last colon screening was in 2005.   REVIEW OF SYSTEMS:  No fevers or chills.  No HEENT symptoms.  No chest pain  or shortness of  breath.  The patient denies any heartburn, nausea or  vomiting, constipation, diarrhea.  No dysuria, frequency, or urgency and all  other systems negative.   PHYSICAL EXAM:  VITAL SIGNS:  Height is 5 feet 7 inches.  Weight is 147  pounds.  Temperature is 98.6.  Pulse is 74.  BP is 101/67 in the left arm in  the seated position.  GENERAL:  The patient is a very pleasant, well-developed, well-nourished 52-  year-old white female in no apparent distress.  HEENT:  Normocephalic, atraumatic.  Pupils are equal and reactive to light  bilaterally.  Extraocular motility was intact.  The patient was anicteric.  Conjunctivae within normal limits.  External auditory canals and tympanic  membranes were clear bilaterally.  Hearing was grossly normal.  Oropharyngeal exam was unremarkable.  The patient had normal dentition.  NECK:  Supple.  No adenopathy, carotid bruit, or thyromegaly.  CHEST:  Normal respiratory effort.  Chest is clear to auscultation  bilaterally.  No rhonchi, rales, or wheezing.  CARDIOVASCULAR:  Regular rate and rhythm.  No significant murmurs, rubs, or   gallops appreciated.  ABDOMEN:  Soft and nontender.  Positive bowel sounds.  No organomegaly.  MUSCULOSKELETAL:  No cyanosis, clubbing, or edema.  SKIN:  Warm and dry.  NEUROLOGIC:  Cranial nerves 2-12 were grossly intact.  She was nonfocal.   Routine EKG was performed in the office, which showed normal sinus rhythm at  63 beats per minute.  No ST changes noted.  Normal intervals.  The patient  also had routine labs.  CBC notable for a mild neutropenia at 4.2 and also  mild thrombocytopenia at 147.  The patient states that this was previously  noted, the thrombocytopenia was, on a previous lab test.  Total cholesterol  135, triglycerides 23.  HDL 40.9, LDL was 90.  Comprehensive metabolic  profile notable for normal BUN at 6, serum creatinine at 0.7.  LFTs were  unremarkable.  TSH was 1.28.   IMPRESSION/RECOMMENDATIONS:  1. Mild neutropenia and thrombocytopenia.  2. History of fatty nodule on right diaphragm status post video-assisted      thoracic surgery procedure.  3. History of basal cell carcinoma followed by dermatology.  4. History of microhematuria.  5. History of osteopenia.  6. Health maintenance.   RECOMMENDATIONS:  The patient has mild neutropenia and thrombocytopenia.  Her differential is unremarkable.  She does not have any splenomegaly or  hepatomegaly on exam.  Possibilities include ITP or possible drug effect,  and I recommended to the patient to discontinue her black cohosh.  We will  repeat her cbc with differential in approximately 6-8 weeks.   With her history of osteopenia, I recommended followup DEXA scan.  She is to  continue her vitamin D with calcium for now.   She has yearly skin exams with her dermatologist, which she will continue,  and before a followup visit, we will try to obtain medical records from her  previous physician.                                   Barbette Hair. Artist Pais, DO   RDY/MedQ  DD:  08/24/2006  DT:  08/26/2006 Job #:  [id number]

## 2011-04-17 NOTE — Discharge Summary (Signed)
NAMEJAHMYA, ONOFRIO NO.:  [id number]   MEDICAL RECORD NO.:  [id number]          PATIENT TYPE:  INP   LOCATION:  2041                         FACILITY:  [facility]   PHYSICIAN:  Salvatore Decent. Dorris Fetch, M.D.DATE OF BIRTH:  05-17-1951   DATE OF ADMISSION:  02/04/2006  DATE OF DISCHARGE:  02/07/2006                                 DISCHARGE SUMMARY   ADMISSION DIAGNOSIS:  Suspected right lower lobe lung nodule.   DISCHARGE/SECONDARY DIAGNOSES:  1.  Smooth nodule on diaphragm status post excision.  2.  Postoperative syncope, believed secondary to vagal reaction.  3.  History of basal cell skin carcinoma.  4.  History of microhematuria with previous workup.   PROCEDURE:  Right video assisted thoracoscopic surgery for excision of fatty  nodules from the diaphragm by Dr. Charlett Lango that was done on February 04, 2006.   HISTORY AND PHYSICAL:  Ms. Turek is a 60 year old Caucasian female who has  been followed for a right pulmonary nodule. She had a workup for  microhematuria in 2004. At that time, she had a CT scan, which evaluated her  kidneys and on the lower chest cut, she was found to have a right lung  nodule. It measured 1 x 1 cm. It was smooth and felt to be in the mid  portion of the right lower lobe, just above the dome of the right diaphragm.  She has since been followed with serial CT scans and there have been some  variability in measurement but it had been noted to be slow growing. Most  recently, a CT scan in December showed it to be 1.5 cm in diameter. On  December 08, 2005, she underwent a PET scan, which showed no evidence of  increased metabolic activity. She has been asymptomatic from this,  specifically denying cough, fever, or chills. There has been no exposure to  tuberculosis or classic fungal exposures. There is no history of cancer  other than basal cell skin carcinoma. She was referred to Cardiothoracic  Surgeon, Dr. Charlett Lango, for  consideration of excision or biopsy of  her right lung nodule. He felt that based on her PET scan results, it most  likely did not represent a non-small cell carcinoma. She had also had no  history of smoking. Since it had slightly increased in size over the last  few years and because there was fairly significant risk from exposure to  repeated CT scans as well as associated anxiety regarding the lesion, he  felt that surgical biopsy was indicated. He recommended a right video  assisted thoracoscopic wedge resection for definitive diagnosis. After  discussing risks, benefits, and alternatives, she agreed to proceed.   HOSPITAL COURSE:  On February 04, 2006, Ms. Guier was electively admitted to  [facility] and did undergo a right VATS with initial plans to  perform a right lower lobe wedge resection of her suspected lung lesion.  However, no lesion was identified and rather a fatty nodule was noted on her  diaphragm. This was excised and sent to pathology with final results still  pending at the time of this dictation. Her small incisions were closed and  no chest tube was inserted. Followup chest x-ray did show a small 5% to 10%  right apical pneumothorax. While in recovery, she did require saline and has  been boluses for hypotension but was later felt stable to transfer to  telemetry unit 2000, where it is anticipated that she will remain until  discharge. Initially, it was anticipated that she will be ready to go home  on postoperative day 1, however, she had been primarily lying flat overnight  and first thing in the morning, she was placed in a wheelchair to go to  radiology for a chest x-ray. During transport, she had a brief episode of  syncope, lasting only seconds. This was preceded by a feeling of light  headedness as well as nausea. She had no chest pain or associated shortness  of breath. An EKG showed normal sinus rhythm although she initially did have  some bradycardia  with her heart rate in the 50's. Her blood pressure also  dropped systolically to the 80's. Once back to bed, she was without  complaint with systolic in the mid 90's. She received another fluid bolus  and her Foley catheter remained to monitor her urine output. Orthostatic's  were checked later that day and she did better with sitting but upon  standing, again had what was felt to be a vagal response with her systolic  blood pressure dropping to around 60 and again was preceded by dizziness and  nausea. Again, once she was placed to bed her symptoms resolved.  Subsequently, her Toradol, Phenergan, Reglan, and morphine PCA were  discontinued. She was continued on Tylox as needed for pain. Based on her  early morning syncopal episode and mid morning pre-syncopal episode, it was  felt that she should remain hospitalized for another day. It was anticipated  that once she was off medication, that she would be able to begin mobilizing  slowly. Once she is able to mobilize without becoming orthostatic, it is  anticipated that she will be ready for discharge home. Her anticipated day  of discharge will be postoperative day 2, February 06, 2006. Her latest chest x-  ray from February 05, 2006 showed no significant change in her 5% to 10% right  apical pneumothorax. Her labs have overall remained stable showing a white  blood cell count of 6.1, platelet count of 136,000, hemoglobin of 11.2 with  hematocrit of 32.8. This was a decreased from her hemoglobin and hematocrit  of 14.1 and 41.7 preoperatively. There is minimal blood loss intra-  operatively, so the drop was felt most likely from hemodilution and a  followup CBC has been ordered prior to discharge. Other labs show a sodium  of 139, potassium 3.8, chloride 111, CO2 25, BUN of 2, creatinine 0.6, and  blood glucose of 128. She has maintained sinus rhythm and has been afebrile.  She was on 2 liters of supplemental oxygen per nasal cannula  postoperatively and is saturating at 99% and it is anticipated that there will be no  difficulty from weaning her from this. Her urine output has remained  adequate. Neurologically, she remains intact and her incisions appear to be  healing well.   DISCHARGE MEDICATIONS:  1.  Tylox 1 to 2 tablets p.o. q.4 hours p.r.n. pain.  2.  She may resume her black cohosh and Caltrate with vitamin D 600 mg 2      tablets each morning.  DIET:  She may follow a regular diet.   ACTIVITY:  She is to avoid driving until seen in followup by Dr.  Dorris Fetch. She is to avoid heavy listing for 2 weeks and she may increase  her activity slowly.   WOUND CARE:  She may shower and clean her incisions gently with mild soap  and water. She should call if she develops fever greater than 101, redness  or drainage from her incision sites, or shortness of breath.   FOLLOW UP:  She is to followup with Dr. Dorris Fetch at the [facility] office on  February 12, 2006 at 1:45 p.m. She is to have a chest  X-ray 1 hour before at [facility] and instructed to bring the chest  x-ray with her to the [facility] office. Dr. Dorris Fetch will review her final  pathology report at her followup appointment.      Jerold Coombe, P.A.    ______________________________  Salvatore Decent Dorris Fetch, M.D.    AWZ/MEDQ  D:  02/05/2006  T:  02/07/2006  Job:  [id number]   cc:   Joni Fears D. Maple Hudson, M.D.  [facility]  [address]  [place]  [place] [address]   Caryl Comes. Slotnick, M.D.  Fax: [phone]   Ernestina Penna, M.D.  Fax: [phone]

## 2011-04-17 NOTE — Op Note (Signed)
NAME:  Elizabeth Douglas, Elizabeth Douglas                          ACCOUNT NO.:  [id number]   MEDICAL RECORD NO.:  [id number]                   PATIENT TYPE:  AMB   LOCATION:  ENDO                                 FACILITY:  [facility]   PHYSICIAN:  Petra Kuba, M.D.                 DATE OF BIRTH:  07-29-1951   DATE OF PROCEDURE:  03/21/2004  DATE OF DISCHARGE:                                 OPERATIVE REPORT   PROCEDURE:  Colonoscopy.   INDICATIONS FOR PROCEDURE:  Screening.   Consent was signed after risks, benefits, methods, and options were  thoroughly discussed in the office.   MEDICINES USED:  Demerol 90, Versed 8.   DESCRIPTION OF PROCEDURE:  Rectal inspection was pertinent for small  external hemorrhoids. Digital exam was negative. The pediatric video  adjustable colonoscope was inserted, easily advanced around the colon to the  cecum. This did not require any abdominal pressure or any position changes.  No abnormality was seen on insertion. The cecum was identified by the  appendiceal orifice and the ileocecal valve. In fact, the scope was inserted  a short ways into the terminal ileum which was normal. Photo documentation  was obtained. The scope was slowly withdrawn. The prep was adequate. There  was minimal liquid stool that required washing and suctioning. On slow  withdrawal through the colon, no tics, masses, polyps, diverticula or other  abnormalities were seen.  Once back in the rectum, anal rectal pullthrough  and retroflexion confirmed some tiny hemorrhoids.  The scope was reinserted  a short ways up the left side of the colon, air was suctioned, scope  removed. The patient tolerated the procedure well. There was no obvious or  immediate complications.   ENDOSCOPIC DIAGNOSIS:  1. Internal and external tiny hemorrhoids.  2. Otherwise within normal limits to the cecum in the terminal ileum.   PLAN:  Happy to see back p.r.n.  Repeat screening in 5-10 years otherwise  yearly rectals  and guaiacs per Dr. Christell Constant.                                               Petra Kuba, M.D.    MEM/MEDQ  D:  03/21/2004  T:  03/21/2004  Job:  [id number]   cc:   Ernestina Penna, M.D.  [address] [place]  [place] [address]  Fax: [phone]

## 2011-08-25 ENCOUNTER — Telehealth: Payer: Self-pay | Admitting: Internal Medicine

## 2011-08-25 DIAGNOSIS — Z0000 Encounter for general adult medical examination without abnormal findings: Secondary | ICD-10-CM

## 2011-08-25 DIAGNOSIS — M899 Disorder of bone, unspecified: Secondary | ICD-10-CM

## 2011-08-25 DIAGNOSIS — M949 Disorder of cartilage, unspecified: Secondary | ICD-10-CM

## 2011-08-25 NOTE — Telephone Encounter (Signed)
Pt would like to have vit d,vit b12 labs added to [facility] labs sch for 09-03-2011. Can I sch?

## 2011-08-25 NOTE — Telephone Encounter (Signed)
Ok to add

## 2011-08-26 NOTE — Telephone Encounter (Signed)
Future orders placed 

## 2011-09-03 ENCOUNTER — Other Ambulatory Visit (INDEPENDENT_AMBULATORY_CARE_PROVIDER_SITE_OTHER): Payer: BC Managed Care – PPO

## 2011-09-03 DIAGNOSIS — M949 Disorder of cartilage, unspecified: Secondary | ICD-10-CM

## 2011-09-03 DIAGNOSIS — M899 Disorder of bone, unspecified: Secondary | ICD-10-CM

## 2011-09-03 DIAGNOSIS — Z0000 Encounter for general adult medical examination without abnormal findings: Secondary | ICD-10-CM

## 2011-09-03 LAB — LIPID PANEL
Cholesterol: 178 mg/dL (ref 0–200)
HDL: 54.3 mg/dL (ref >39.00)
LDL Cholesterol: 117 mg/dL — ABNORMAL HIGH (ref 0–99)
Triglycerides: 36 mg/dL (ref 0.0–149.0)
VLDL: 7.2 mg/dL (ref 0.0–40.0)

## 2011-09-03 LAB — CBC WITH DIFFERENTIAL/PLATELET
Basophils Absolute: 0 10*3/uL (ref 0.0–0.1)
Eosinophils Absolute: 0.1 10*3/uL (ref 0.0–0.7)
HCT: 42.6 % (ref 36.0–46.0)
Hemoglobin: 14.3 g/dL (ref 12.0–15.0)
Lymphs Abs: 1.7 10*3/uL (ref 0.7–4.0)
MCHC: 33.7 g/dL (ref 30.0–36.0)
MCV: 96 fl (ref 78.0–100.0)
Monocytes Absolute: 0.2 10*3/uL (ref 0.1–1.0)
Monocytes Relative: 5.4 % (ref 3.0–12.0)
Neutro Abs: 2.3 10*3/uL (ref 1.4–7.7)
Platelets: 151 10*3/uL (ref 150.0–400.0)
RDW: 13.2 % (ref 11.5–14.6)

## 2011-09-03 LAB — POCT URINALYSIS DIPSTICK
Ketones, UA: NEGATIVE
Protein, UA: NEGATIVE
pH, UA: 7

## 2011-09-03 LAB — HEPATIC FUNCTION PANEL
Albumin: 4.4 g/dL (ref 3.5–5.2)
Total Bilirubin: 0.8 mg/dL (ref 0.3–1.2)

## 2011-09-03 LAB — BASIC METABOLIC PANEL
BUN: 11 mg/dL (ref 6–23)
CO2: 30 mEq/L (ref 19–32)
GFR: 86.96 mL/min (ref >60.00)
Glucose, Bld: 93 mg/dL (ref 70–99)
Potassium: 5.3 mEq/L — ABNORMAL HIGH (ref 3.5–5.1)

## 2011-09-10 ENCOUNTER — Encounter: Payer: BC Managed Care – PPO | Admitting: Internal Medicine

## 2011-09-22 ENCOUNTER — Other Ambulatory Visit (INDEPENDENT_AMBULATORY_CARE_PROVIDER_SITE_OTHER): Payer: BC Managed Care – PPO

## 2011-09-22 DIAGNOSIS — I10 Essential (primary) hypertension: Secondary | ICD-10-CM

## 2011-09-22 LAB — BASIC METABOLIC PANEL
BUN: 11 mg/dL (ref 6–23)
Chloride: 106 mEq/L (ref 96–112)
Creatinine, Ser: 0.7 mg/dL (ref 0.4–1.2)

## 2011-09-28 ENCOUNTER — Telehealth: Payer: Self-pay | Admitting: Internal Medicine

## 2011-09-28 NOTE — Telephone Encounter (Signed)
Pt requesting results of labs from last week please contact

## 2011-09-29 NOTE — Telephone Encounter (Signed)
LMTCB

## 2011-09-29 NOTE — Telephone Encounter (Signed)
Ok to mail copy of labs of recent lab results to patient

## 2011-09-29 NOTE — Telephone Encounter (Signed)
Her potassium has returned to normal levels

## 2011-09-29 NOTE — Telephone Encounter (Signed)
Pt aware.

## 2011-09-30 ENCOUNTER — Ambulatory Visit (INDEPENDENT_AMBULATORY_CARE_PROVIDER_SITE_OTHER): Payer: BC Managed Care – PPO | Admitting: Internal Medicine

## 2011-09-30 ENCOUNTER — Encounter: Payer: Self-pay | Admitting: Internal Medicine

## 2011-09-30 DIAGNOSIS — K219 Gastro-esophageal reflux disease without esophagitis: Secondary | ICD-10-CM

## 2011-09-30 DIAGNOSIS — Z0000 Encounter for general adult medical examination without abnormal findings: Secondary | ICD-10-CM

## 2011-09-30 MED ORDER — OMEPRAZOLE-SODIUM BICARBONATE 40-1100 MG PO CAPS: 1.0000 | ORAL_CAPSULE | Freq: Two times a day (BID) | ORAL | Status: DC

## 2011-09-30 NOTE — Progress Notes (Signed)
Subjective:    Patient ID: Elizabeth Douglas, female    DOB: 05/26/1951, 60 y.o.   MRN: [id number]  HPI  60 year old white female with a history of chronic GERD for routine physical. She denies any significant interval medical history other than worsening her symptoms. She is currently taking Nexium 40 mg once daily. However despite taking her PPI on a regular basis she has persistent GERD symptoms.  She was previously seen by gastroenterologist in 2009 underwent EGD. He was only notable for mild gastritis. H. pylori testing was negative.  Review of Systems  Constitutional: Negative for activity change, appetite change and unexpected weight change.  Eyes: Negative for visual disturbance.  Respiratory: Negative for cough, chest tightness and shortness of breath.   Cardiovascular: Negative for chest pain.  Genitourinary: Negative for difficulty urinating.  Neurological: Negative for headaches.  Gastrointestinal: Negative for abdominal pain, melena or hematochezia her last colonoscopy was in 2005.   Psych: Negative for depression or anxiety     Patient also had a life screening tests at her local church. Carotid Dopplers were negative, AAA screening was negative, peripheral arterial disease negative, kidney function was normal  Past Medical History  Diagnosis Date  . Excess or deficiency of vitamin D   . Basal cell carcinoma   . Microhematuria   . GERD (gastroesophageal reflux disease)   . Osteopenia   . Atypical chest pain     History   Social History  . Marital Status: Married    Spouse Name: N/A    Number of Children: 1  . Years of Education: N/A   Occupational History  . Not on file.   Social History Main Topics  . Smoking status: Never Smoker   . Smokeless tobacco: Not on file  . Alcohol Use: No  . Drug Use: Not on file  . Sexually Active: Not on file   Other Topics Concern  . Not on file   Social History Narrative  . No narrative on file    Past Surgical  History  Procedure Date  . Lung biopsy     Family History  Problem Relation Age of Onset  . COPD Mother   . Prostate cancer Father   . Heart disease Brother   . Lymphoma Brother     non and hodgkins  . Coronary artery disease Brother   . Osteoporosis Mother     No Known Allergies  Current Outpatient Prescriptions on File Prior to Visit  Medication Sig Dispense Refill  . Cholecalciferol (VITAMIN D3) 2000 UNITS TABS Take 1 tablet by mouth daily.        Marland Kitchen estradiol (ESTRACE VAGINAL) 0.1 MG/GM vaginal cream Place vaginally as directed.        . Multiple Minerals-Vitamins (CITRACAL PLUS PO) Take 1 tablet by mouth 2 (two) times daily.          BP 124/80  Pulse 76  Temp(Src) 98.2 F (36.8 C) (Oral)  Ht 5' 6.5" (1.689 m)  Wt 154 lb (69.854 kg)  BMI 24.48 kg/m2    Objective:   Physical Exam   Constitutional: Appears well-developed and well-nourished. No distress.  Head: Normocephalic and atraumatic.  Ear: Right and left ear normal.  Gross hearing is normal Mouth/Throat: Oropharynx is clear and moist.  Eyes: Conjunctivae are normal. Pupils are equal, round, and reactive to light.  Neck: Normal range of motion. Neck supple. No thyromegaly present. No carotid bruit Cardiovascular: Normal rate, regular rhythm and normal heart sounds.  Exam reveals  no gallop and no friction rub.  No murmur heard. Pulmonary/Chest: Effort normal and breath sounds normal.  No wheezes. No rales.  Abdominal: Soft. Bowel sounds are normal. No mass. There is no tenderness.  Neurological: Alert. No cranial nerve deficit.  Skin: Skin is warm and dry.  Psychiatric: Normal mood and affect. Behavior is normal.      Assessment & Plan:

## 2011-09-30 NOTE — Assessment & Plan Note (Signed)
Patient has persistent symptoms despite taking Nexium once daily. Change to Zegerid 40/1100 twice a day. She already has follow up with Dr. Gerilyn Pilgrim.  She has consider nissen fundoplication.  She will discuss with GI

## 2011-09-30 NOTE — Assessment & Plan Note (Signed)
Reviewed adult health maintenance protocols. Pt is up to date  mammogram, Pap and pelvic, and screening lipid panel.  I suggested patient follow up with her gastroenterologist for repeat colonoscopy Patient declines influenza vaccine. Her weight is acceptable.  Patient to continue prudent diet and regular exercise.

## 2011-10-06 ENCOUNTER — Encounter: Payer: Self-pay | Admitting: Gastroenterology

## 2011-10-06 ENCOUNTER — Ambulatory Visit (INDEPENDENT_AMBULATORY_CARE_PROVIDER_SITE_OTHER): Payer: BC Managed Care – PPO | Admitting: Gastroenterology

## 2011-10-06 VITALS — BP 110/72 | HR 60 | Ht 67.0 in | Wt 154.0 lb

## 2011-10-06 DIAGNOSIS — K219 Gastro-esophageal reflux disease without esophagitis: Secondary | ICD-10-CM

## 2011-10-06 NOTE — Patient Instructions (Signed)
Samples of nexium given, take one pill twice daily (20-30 min before BF and dinner meals). Continue pepcid one pill at bedtime. Call Dr. Christella Hartigan' office in 3-4 weeks to report on symptoms on this MAXIMUM medical acid suppression.

## 2011-10-06 NOTE — Progress Notes (Signed)
Review of gastrointestinal problems:  1. GERD: EGD 2005 (Dr. Ewing Schlein) this was normal. EGD 9/09 Christella Hartigan), mild-nonspec gastritis, H. pylori Negative. Perhaps Contributing to intermittent chest discomforts. Cardiac workup 2010 negative. Symptoms improved with Carafate. 01/2011  2. Routine risk fo colon cancer: Colonoscopy 2005 (Dr. Ewing Schlein) this was normal.   HPI: This is a   very pleasant 60 year old woman whom I last saw 7 months ago  She has been taking H2 blocker at night for months (bedtime for a long time).  Brief trial of zegeride was not helpful.  Now back on nexium first thing in AM, breakfast 30-45 min later.  Still taking maalox 3-6 per night.   Drinks 12 oz cafeine at lunch.  Rarely peppermint, chocolate.  No etoh.    No dysphagia.  She has gained weight, a bit (10 pounds in past 2-3 years).]  Symptoms are worse at night.  Stinging feeling, retrosternal.     Past Medical History  Diagnosis Date  . Excess or deficiency of vitamin D   . Basal cell carcinoma   . Microhematuria   . GERD (gastroesophageal reflux disease)   . Osteopenia   . Atypical chest pain     Past Surgical History  Procedure Date  . Lung biopsy     Current Outpatient Prescriptions  Medication Sig Dispense Refill  . Alum & Mag Hydroxide-Simeth (ANTACID LIQUID PO) Take by mouth daily.        . Cholecalciferol (VITAMIN D3) 2000 UNITS TABS Take 1 tablet by mouth daily.        Marland Kitchen esomeprazole (NEXIUM) 40 MG capsule Take 40 mg by mouth daily before breakfast.        . estradiol (ESTRACE VAGINAL) 0.1 MG/GM vaginal cream Place vaginally as directed.        . Multiple Minerals-Vitamins (CITRACAL PLUS PO) Take 1 tablet by mouth 2 (two) times daily.          Allergies as of 10/06/2011  . (No Known Allergies)    Family History  Problem Relation Age of Onset  . COPD Mother   . Prostate cancer Father   . Heart disease Brother   . Lymphoma Brother     non and hodgkins  . Coronary artery disease Brother     . Osteoporosis Mother     History   Social History  . Marital Status: Married    Spouse Name: N/A    Number of Children: 1  . Years of Education: N/A   Occupational History  . Retired    Social History Main Topics  . Smoking status: Never Smoker   . Smokeless tobacco: Never Used  . Alcohol Use: No  . Drug Use: No  . Sexually Active: Not on file   Other Topics Concern  . Not on file   Social History Narrative  . No narrative on file      Physical Exam: BP 110/72  Pulse 60  Ht 5\' 7"  (1.702 m)  Wt 154 lb (69.854 kg)  BMI 24.12 kg/m2 Constitutional: generally well-appearing Psychiatric: alert and oriented x3 Abdomen: soft, nontender, nondistended, no obvious ascites, no peritoneal signs, normal bowel sounds     Assessment and plan: 60 y.o. female with likely acid related symptoms  I am going to have her increase her proton pump inhibitor to one pill twice daily and continue on a nightly H2 blocker. This will be maximum medical acid suppression. She will call to report on her symptoms in 3-4 weeks and if she  is still bothered by GERD-like symptoms then I would likely proceed with EGD, wireless pH testing.

## 2011-10-09 ENCOUNTER — Telehealth: Payer: Self-pay | Admitting: Gastroenterology

## 2011-10-09 NOTE — Telephone Encounter (Signed)
Pt is calling her insurance to see what they will cover in regards to the PPI, she will call back

## 2011-10-09 NOTE — Telephone Encounter (Signed)
Error

## 2011-10-09 NOTE — Telephone Encounter (Signed)
Pt prevacid samples at front desk

## 2011-11-02 ENCOUNTER — Telehealth: Payer: Self-pay | Admitting: Gastroenterology

## 2011-11-02 NOTE — Telephone Encounter (Signed)
Pt is having pain under her shoulder blade and after she eats.  She feels like it may be her gall bladder.  She is has been scheduled an appt to discuss with Dr Christella Hartigan

## 2011-12-04 ENCOUNTER — Ambulatory Visit (INDEPENDENT_AMBULATORY_CARE_PROVIDER_SITE_OTHER): Payer: BC Managed Care – PPO | Admitting: Gastroenterology

## 2011-12-04 ENCOUNTER — Encounter: Payer: Self-pay | Admitting: Gastroenterology

## 2011-12-04 VITALS — BP 120/62 | HR 60 | Ht 67.0 in | Wt 154.0 lb

## 2011-12-04 DIAGNOSIS — M546 Pain in thoracic spine: Secondary | ICD-10-CM

## 2011-12-04 DIAGNOSIS — K219 Gastro-esophageal reflux disease without esophagitis: Secondary | ICD-10-CM

## 2011-12-04 DIAGNOSIS — M549 Dorsalgia, unspecified: Secondary | ICD-10-CM

## 2011-12-04 MED ORDER — OMEPRAZOLE 20 MG PO CPDR: 20.0000 mg | DELAYED_RELEASE_CAPSULE | Freq: Every day | ORAL | Status: DC

## 2011-12-04 NOTE — Patient Instructions (Addendum)
Samples of ppi given for twice daily dosing, 4 weeks worth.  Try cutting back to one pill a day instead of two. Continue pepcid at bedtime. You will be set up for an ultrasound for your epigastric, right upper back pain.  [facility] 12/07/11 arrive at 745 am for an 8 am appointment please have nothing to eat or drink after midnight.

## 2011-12-04 NOTE — Progress Notes (Signed)
Review of gastrointestinal problems:  1. GERD: EGD 2005 (Dr. Ewing Schlein) this was normal. EGD 9/09 Christella Hartigan), mild-nonspec gastritis, H. pylori Negative. Perhaps Contributing to intermittent chest discomforts. Cardiac workup 2010 negative. Symptoms improved with Carafate. 01/2011  2. Routine risk fo colon cancer: Colonoscopy 2005 (Dr. Ewing Schlein) this was normal.   HPI: This is a  a very pleasant 61 year old woman whom I last saw about 3 months ago. She is taking prevacid and prilosec (before BF and dinner meals) and h2 blocker at bedtime.  She still has stabbing, burning pain retrosternal and pain under right shoulder blade intermittentlly. The symptoms incompletely responded to the maximum acid therapy.    GB in place, intermittent nausea.  she had an ultrasound about 3 years ago and it suggested a small gallbladder polyp, otherwise the ultrasound was normal  Past Medical History  Diagnosis Date  . Excess or deficiency of vitamin D   . Basal cell carcinoma   . Microhematuria   . GERD (gastroesophageal reflux disease)   . Osteopenia   . Atypical chest pain     Past Surgical History  Procedure Date  . Lung biopsy     Current Outpatient Prescriptions  Medication Sig Dispense Refill  . Alum & Mag Hydroxide-Simeth (ANTACID LIQUID PO) Take by mouth as needed.       . Cholecalciferol (VITAMIN D3) 2000 UNITS TABS Take 1 tablet by mouth daily.        Marland Kitchen estradiol (ESTRACE VAGINAL) 0.1 MG/GM vaginal cream Place vaginally as directed.        . Famotidine (PEPCID PO) Take 1 tablet by mouth at bedtime.        . Lansoprazole (PREVACID PO) Take 1 tablet by mouth every morning.        . Multiple Minerals-Vitamins (CITRACAL PLUS PO) Take 1 tablet by mouth 2 (two) times daily.        . Omeprazole (PRILOSEC PO) Take 1 tablet by mouth daily. With before supper         Allergies as of 12/04/2011  . (No Known Allergies)    Family History  Problem Relation Age of Onset  . COPD Mother   . Prostate cancer  Father   . Heart disease Brother   . Lymphoma Brother     non and hodgkins  . Coronary artery disease Brother   . Osteoporosis Mother     History   Social History  . Marital Status: Married    Spouse Name: N/A    Number of Children: 1  . Years of Education: N/A   Occupational History  . Retired    Social History Main Topics  . Smoking status: Never Smoker   . Smokeless tobacco: Never Used  . Alcohol Use: No  . Drug Use: No  . Sexually Active: Not on file   Other Topics Concern  . Not on file   Social History Narrative  . No narrative on file      Physical Exam: BP 120/62  Pulse 60  Ht 5\' 7"  (1.702 m)  Wt 154 lb (69.854 kg)  BMI 24.12 kg/m2 Constitutional: generally well-appearing Psychiatric: alert and oriented x3 Abdomen: soft, nontender, nondistended, no obvious ascites, no peritoneal signs, normal bowel sounds     Assessment and plan: 61 y.o. female with  epigastric discomfort, right shoulder, right upper back discomfort.  It is not clear that her symptoms are related to acid. She has had an incomplete response to maximum acid suppression (twice daily proton pump inhibitor  and at bedtime H2 blocker). She had a gallbladder polyp noted about 3 years ago by ultrasound and her symptoms are somewhat biliary. I'm going to have her repeat her ultrasound. She is also going to cut back a bit on her proton pump inhibitor to see if her symptoms clearly are worse as she reduces the antiacid medicines. If the gallbladder ultrasound is normal and she has not had a dose response  in her symptoms then I will set up a HIDA scan to estimate her gallbladder ejection fraction.

## 2011-12-07 ENCOUNTER — Ambulatory Visit (HOSPITAL_COMMUNITY)
Admission: RE | Admit: 2011-12-07 | Discharge: 2011-12-07 | Disposition: A | Discharge status: Home / Self Care | Payer: BC Managed Care – PPO | Source: Ambulatory Visit | Attending: Gastroenterology | Admitting: Gastroenterology

## 2011-12-07 DIAGNOSIS — M546 Pain in thoracic spine: Secondary | ICD-10-CM | POA: Insufficient documentation

## 2011-12-07 DIAGNOSIS — M549 Dorsalgia, unspecified: Secondary | ICD-10-CM

## 2011-12-07 DIAGNOSIS — K824 Cholesterolosis of gallbladder: Secondary | ICD-10-CM | POA: Insufficient documentation

## 2011-12-07 DIAGNOSIS — R109 Unspecified abdominal pain: Secondary | ICD-10-CM | POA: Insufficient documentation

## 2012-01-05 ENCOUNTER — Telehealth: Payer: Self-pay | Admitting: Gastroenterology

## 2012-01-05 NOTE — Telephone Encounter (Signed)
Pt stopped taking prilosec twice daily and her symptoms became worse.  She went back on twice daily and has much better relief, she still has some days that are worse than others. Can she stay on twice daily and can she add pepcid or zantac at night?  Samples of prilosec left at the front desk.

## 2012-01-06 MED ORDER — OMEPRAZOLE 20 MG PO CPDR: 20.0000 mg | DELAYED_RELEASE_CAPSULE | Freq: Two times a day (BID) | ORAL | Status: DC

## 2012-01-06 NOTE — Telephone Encounter (Signed)
Yes, bid PPI (let's write a script for prilosec bid disp 60 with 11 refills) also bedtime zantac (or pepcid).  ROV with me in 2 months to see how she his doing.

## 2012-01-06 NOTE — Telephone Encounter (Signed)
Pt aware that medication sent and ROV scheduled

## 2012-01-06 NOTE — Telephone Encounter (Signed)
Left message on machine to call back  

## 2012-02-23 ENCOUNTER — Ambulatory Visit (INDEPENDENT_AMBULATORY_CARE_PROVIDER_SITE_OTHER): Payer: BC Managed Care – PPO | Admitting: Gastroenterology

## 2012-02-23 ENCOUNTER — Encounter: Payer: Self-pay | Admitting: Gastroenterology

## 2012-02-23 ENCOUNTER — Ambulatory Visit: Payer: BC Managed Care – PPO | Admitting: Gastroenterology

## 2012-02-23 VITALS — BP 110/82 | HR 80 | Ht 67.0 in | Wt 155.0 lb

## 2012-02-23 DIAGNOSIS — R1013 Epigastric pain: Secondary | ICD-10-CM

## 2012-02-23 DIAGNOSIS — K3189 Other diseases of stomach and duodenum: Secondary | ICD-10-CM

## 2012-02-23 MED ORDER — METOCLOPRAMIDE HCL 10 MG PO TABS: 10.0000 mg | ORAL_TABLET | Freq: Every day | ORAL | Status: DC

## 2012-02-23 NOTE — Progress Notes (Signed)
Review of gastrointestinal problems:  1. GERD: EGD 2005 (Dr. Ewing Schlein) this was normal. EGD 9/09 Elizabeth Douglas), mild-nonspec gastritis, H. pylori Negative. Perhaps Contributing to intermittent chest discomforts. Cardiac workup 2010 negative. Symptoms improved with Carafate. Pyrosis usually under good control with  2. Routine risk fo colon cancer: Colonoscopy 2005 (Dr. Ewing Schlein) this was normal. 3. Dyspepsia, intermittent abd pains: 2013 ultrasound showed small gallbladder polyp.  HPI: This is a very pleasant 61 year old woman whom I last saw about a month ago.   When she decreased her PPI to once daily her Pyrosis returned fairly quickly. She returned to twice daily PPI and once nightly Pepcid.  IN am she has full feeling in epigagsrium,  Always feels pretty nauseated.  She is pretty certain that she had a gastric emptying scan done 15-20 years ago. She remembers being told she had a slow stomach and was started on a medicine which she recalled was Propulsid.  Has 3 meals a day, with mid afternoon snack.  Past Medical History  Diagnosis Date  . Excess or deficiency of vitamin D   . Basal cell carcinoma   . Microhematuria   . GERD (gastroesophageal reflux disease)   . Osteopenia   . Atypical chest pain     Past Surgical History  Procedure Date  . Lung biopsy     Current Outpatient Prescriptions  Medication Sig Dispense Refill  . Alum & Mag Hydroxide-Simeth (ANTACID LIQUID PO) Take by mouth as needed.       . Cholecalciferol (VITAMIN D3) 2000 UNITS TABS Take 1 tablet by mouth daily.        Marland Kitchen estradiol (ESTRACE VAGINAL) 0.1 MG/GM vaginal cream Place vaginally as directed.        . Famotidine (PEPCID PO) Take 1 tablet by mouth at bedtime.        . Lansoprazole (PREVACID PO) Take 1 tablet by mouth every morning.        . Multiple Minerals-Vitamins (CITRACAL PLUS PO) Take 1 tablet by mouth 2 (two) times daily.        Marland Kitchen omeprazole (PRILOSEC) 20 MG capsule Take 1 capsule (20 mg total) by mouth 2  (two) times daily.  60 capsule  11    Allergies as of 02/23/2012  . (No Known Allergies)    Family History  Problem Relation Age of Onset  . COPD Mother   . Prostate cancer Father   . Heart disease Brother   . Lymphoma Brother     non and hodgkins  . Coronary artery disease Brother   . Osteoporosis Mother     History   Social History  . Marital Status: Married    Spouse Name: N/A    Number of Children: 1  . Years of Education: N/A   Occupational History  . Retired    Social History Main Topics  . Smoking status: Never Smoker   . Smokeless tobacco: Never Used  . Alcohol Use: No  . Drug Use: No  . Sexually Active: Not on file   Other Topics Concern  . Not on file   Social History Narrative  . No narrative on file      Physical Exam: BP 110/82  Pulse 80  Ht 5\' 7"  (1.702 m)  Wt 155 lb (70.308 kg)  BMI 24.28 kg/m2 Constitutional: generally well-appearing Psychiatric: alert and oriented x3 Abdomen: soft, nontender, nondistended, no obvious ascites, no peritoneal signs, normal bowel sounds     Assessment and plan: 61 y.o. female with GERD, possible  gastroparesis  We will try to track down her previous gastric emptying study test results. She understands that that was 18-20 years ago and it may be difficult to find the results. It sounds like Propulsid helped her but she weaned herself off after a few years. I am going to try her on 10 mg of greater than that she will take at bedtime every night since her fullness, bloating seemed to be in the morning mostly. She will call to report on her symptoms in 4-5 weeks.

## 2012-02-23 NOTE — Patient Instructions (Addendum)
We will track down your previous "gastric emptying study" done through this office (will contact you if/when we find those results). Trial of reglan (10mg  pill, take one at bedtime), called into your pharmacy. We will give you omeprazole samples if we have them. Call Dr. Christella Hartigan office in 4-5 weeks to report on your symptoms.

## 2012-03-22 ENCOUNTER — Telehealth: Payer: Self-pay | Admitting: Gastroenterology

## 2012-03-22 NOTE — Telephone Encounter (Signed)
Samples at front desk pt aware

## 2012-04-11 ENCOUNTER — Telehealth: Payer: Self-pay | Admitting: Gastroenterology

## 2012-04-11 NOTE — Telephone Encounter (Signed)
prilosec samples left at the front desk I will call [facility] and see if they have a copy of her GES from 10/1991  A request was sent to medical records for the GES report

## 2012-04-20 ENCOUNTER — Encounter: Payer: Self-pay | Admitting: Internal Medicine

## 2012-04-20 ENCOUNTER — Telehealth: Payer: Self-pay | Admitting: Gastroenterology

## 2012-04-20 ENCOUNTER — Ambulatory Visit (INDEPENDENT_AMBULATORY_CARE_PROVIDER_SITE_OTHER): Payer: BC Managed Care – PPO | Admitting: Internal Medicine

## 2012-04-20 ENCOUNTER — Telehealth: Payer: Self-pay

## 2012-04-20 VITALS — BP 114/76 | HR 88 | Temp 98.7°F | Ht 67.0 in | Wt 154.0 lb

## 2012-04-20 DIAGNOSIS — R002 Palpitations: Secondary | ICD-10-CM | POA: Insufficient documentation

## 2012-04-20 LAB — CBC WITH DIFFERENTIAL/PLATELET
Basophils Relative: 0.4 % (ref 0.0–3.0)
Eosinophils Relative: 0.9 % (ref 0.0–5.0)
Lymphocytes Relative: 35.6 % (ref 12.0–46.0)
Neutrophils Relative %: 57.5 % (ref 43.0–77.0)
RBC: 4.28 Mil/uL (ref 3.87–5.11)
WBC: 5.4 10*3/uL (ref 4.5–10.5)

## 2012-04-20 LAB — BASIC METABOLIC PANEL
Calcium: 9 mg/dL (ref 8.4–10.5)
Creatinine, Ser: 0.7 mg/dL (ref 0.4–1.2)
GFR: 94.17 mL/min (ref >60.00)

## 2012-04-20 LAB — T4, FREE: Free T4: 0.74 ng/dL (ref 0.60–1.60)

## 2012-04-20 NOTE — Progress Notes (Signed)
Subjective:    Patient ID: Elizabeth Douglas, female    DOB: [birth year], 61 y.o.   MRN: [id number]  Palpitations  This is a new problem. The current episode started 1 to 4 weeks ago. The problem occurs intermittently. The problem has been unchanged. The symptoms are aggravated by unknown. Associated symptoms include an irregular heartbeat. Pertinent negatives include no anxiety, chest pain, coughing, dizziness, shortness of breath, syncope or weakness. Risk factors include no known risk factors.   She has history of atypical chest pain in the past secondary to GERD symptoms. She had normal cardiac cath.  Review of Systems  Respiratory: Negative for cough and shortness of breath.   Cardiovascular: Positive for palpitations. Negative for chest pain and syncope.  Neurological: Negative for dizziness and weakness.   she has not started any over-the-counter medications or supplements.  Past Medical History  Diagnosis Date  . Excess or deficiency of vitamin D   . Basal cell carcinoma   . Microhematuria   . GERD (gastroesophageal reflux disease)   . Osteopenia   . Atypical chest pain     History   Social History  . Marital Status: Married    Spouse Name: N/A    Number of Children: 1  . Years of Education: N/A   Occupational History  . Retired    Social History Main Topics  . Smoking status: Never Smoker   . Smokeless tobacco: Never Used  . Alcohol Use: No  . Drug Use: No  . Sexually Active: Not on file   Other Topics Concern  . Not on file   Social History Narrative  . No narrative on file    Past Surgical History  Procedure Date  . Lung biopsy     Family History  Problem Relation Age of Onset  . COPD Mother   . Prostate cancer Father   . Heart disease Brother   . Lymphoma Brother     non and hodgkins  . Coronary artery disease Brother   . Osteoporosis Mother     No Known Allergies  Current Outpatient Prescriptions on File Prior to Visit  Medication Sig  Dispense Refill  . Alum & Mag Hydroxide-Simeth (ANTACID LIQUID PO) Take by mouth as needed.       . Cholecalciferol (VITAMIN D3) 2000 UNITS TABS Take 1 tablet by mouth daily.        Marland Kitchen estradiol (ESTRACE VAGINAL) 0.1 MG/GM vaginal cream Place vaginally as directed.        . Multiple Minerals-Vitamins (CITRACAL PLUS PO) Take 1 tablet by mouth 2 (two) times daily.        Marland Kitchen omeprazole (PRILOSEC) 20 MG capsule Take 1 capsule (20 mg total) by mouth 2 (two) times daily.  60 capsule  11    BP 114/76  Pulse 88  Temp(Src) 98.7 F (37.1 C) (Oral)  Ht 5\' 7"  (1.702 m)  Wt 154 lb (69.854 kg)  BMI 24.12 kg/m2  EKG showed normal sinus rhythm at 69 beats per minute. Possible low voltage.     Objective:   Physical Exam  Constitutional: She is oriented to person, place, and time. She appears well-developed and well-nourished. No distress.  HENT:  Head: Normocephalic.  Mouth/Throat: Oropharynx is clear and moist.  Eyes: EOM are normal. Pupils are equal, round, and reactive to light.  Neck: Normal range of motion. Neck supple. No thyromegaly present.  Cardiovascular: Normal rate, regular rhythm and normal heart sounds.   No murmur heard.  Rare premature contraction  Pulmonary/Chest: Effort normal and breath sounds normal. She has no wheezes. She has no rales.  Abdominal: Soft. Bowel sounds are normal. There is no tenderness.  Musculoskeletal: She exhibits no edema.  Neurological: She is alert and oriented to person, place, and time.  Psychiatric: She has a normal mood and affect. Her behavior is normal.          Assessment & Plan:

## 2012-04-20 NOTE — Telephone Encounter (Signed)
Pt had "fluttering"  In her chest last night and again this morning she says it was very concerning for her and not sure if it is cardiac or stomach.  I advised that she should contact her cardiologist and be evaluated and if they feel she should see GI then we can get her in.  Pt agreed

## 2012-04-20 NOTE — Telephone Encounter (Signed)
Triage VM:  Pt states she is experiencing a "fluttering in her chest" that is concerning.  Pt states she is not sure if this is GI or heart related.  Pt would like an appt today with Dr. Artist Pais. Pls advise.

## 2012-04-20 NOTE — Patient Instructions (Signed)
Stop all caffeinated beverages. Stop taking Pepcid. Our office will try to arrange an earlier cardiology appointment Please call our office if your symptoms do not improve or gets worse.

## 2012-04-20 NOTE — Assessment & Plan Note (Addendum)
61 year old white female complains of intermittent fluttering sensation in her chest. I suspect her symptoms are due to PACs. She has no significant risk factors other than family hx of CAD and previous cardiac cath was normal. Check electrolytes, magnesium level and thyroid studies. Patient advised to stop all caffeine use. She is ready made an appointment with cardiologist. Defer further evaluation such as Holter monitor or apparent cord her to cardiology.  It is unclear whether her use of famotidine is a contributing factor to her palpitations. Discontinued  famotidine and use ranitidine instead.

## 2012-04-20 NOTE — Telephone Encounter (Signed)
Pt thinks it could be Acid Reflux but GI told her to contact her cardiologist.  Pt states that Cardiology can't see her till her June.  She was told by Cardiology to f/u with PCP.  Appt scheduled for 1:15

## 2012-04-27 ENCOUNTER — Telehealth: Payer: Self-pay | Admitting: Gastroenterology

## 2012-04-27 NOTE — Telephone Encounter (Signed)
Pt was advised no samples available at this time.  Pt will call next week to see if we have any available

## 2012-05-11 ENCOUNTER — Encounter: Payer: Self-pay | Admitting: Cardiovascular Disease

## 2012-05-11 ENCOUNTER — Ambulatory Visit (INDEPENDENT_AMBULATORY_CARE_PROVIDER_SITE_OTHER): Payer: BC Managed Care – PPO | Admitting: Cardiovascular Disease

## 2012-05-11 VITALS — BP 104/70 | HR 64 | Ht 67.0 in | Wt 153.8 lb

## 2012-05-11 DIAGNOSIS — R002 Palpitations: Secondary | ICD-10-CM

## 2012-05-11 NOTE — Patient Instructions (Addendum)
Your physician recommends that you schedule a follow-up appointment in: only if needed.  We will contact you with the results of your tests.  Your physician has requested that you have an echocardiogram. Echocardiography is a painless test that uses sound waves to create images of your heart. It provides your doctor with information about the size and shape of your heart and how well your heart's chambers and valves are working. This procedure takes approximately one hour. There are no restrictions for this procedure.  Your physician has recommended that you wear a holter monitor. Holter monitors are medical devices that record the heart's electrical activity. Doctors most often use these monitors to diagnose arrhythmias. Arrhythmias are problems with the speed or rhythm of the heartbeat. The monitor is a small, portable device. You can wear one while you do your normal daily activities. This is usually used to diagnose what is causing palpitations/syncope (passing out).

## 2012-05-11 NOTE — Assessment & Plan Note (Signed)
Her symptoms are suggestive of premature beats. She has no previous history of documented arrhythmia. She cut down on caffeine and take with subsequent improvement but her symptoms did not resolve completely. She had routine labs performed and they were all unremarkable including normal thyroid function. I recommend a 48 hour Holter monitor as well as an echocardiogram. No ischemic cardiac evaluation is warranted at this time. She has no symptoms suggestive of angina and had cardiac catheterization in 2010 showed normal coronary arteries. I discussed with her the natural history and treatment of premature beats. If this is all what she has a Holter monitor, no treatment would probably be warranted given that her symptoms are overall mild. I agree with decreasing caffeine intake.

## 2012-05-11 NOTE — Progress Notes (Signed)
HPI  This is a 61 year old female who is referred for evaluation of palpitations. The patient was seen in 2010 by Dr. Jens Som. At that time she was evaluated for chest pain. She underwent cardiac catheterization which showed normal coronary arteries with normal ejection fraction. No cardiac events since then. She recently had symptoms of palpitations described as skipping in her heart without tachycardia. There was no associated dizziness, syncope or presyncope. She also had some chest discomfort which was similar to her regular pain related to GERD. She was noted during her last visit to have premature beats on physical exam. The patient has no previous history of arrhythmia. She denies any syncope or presyncope. There is no family history of significant arrhythmia. She does have family history of premature coronary artery disease. She denies any exertional chest pain or dyspnea. Her Pepcid was changed to ranitidine. She also stopped drinking caffeinated product at all. Her palpitations have actually improved since then.  No Known Allergies   Current Outpatient Prescriptions on File Prior to Visit  Medication Sig Dispense Refill  . Alum & Mag Hydroxide-Simeth (ANTACID LIQUID PO) Take by mouth as needed.       . Cholecalciferol (VITAMIN D3) 2000 UNITS TABS Take 1 tablet by mouth daily.        . Diclofenac Sodium (SOLARAZE TD) Place onto the skin 2 (two) times daily.      Marland Kitchen estradiol (ESTRACE VAGINAL) 0.1 MG/GM vaginal cream Place vaginally as directed.        . Multiple Minerals-Vitamins (CITRACAL PLUS PO) Take 1 tablet by mouth 2 (two) times daily.        Marland Kitchen omeprazole (PRILOSEC) 20 MG capsule Take 1 capsule (20 mg total) by mouth 2 (two) times daily.  60 capsule  11     Past Medical History  Diagnosis Date  . Excess or deficiency of vitamin D   . Basal cell carcinoma   . Microhematuria   . GERD (gastroesophageal reflux disease)   . Osteopenia   . Atypical chest pain      Past  Surgical History  Procedure Date  . Lung biopsy      Family History  Problem Relation Age of Onset  . COPD Mother   . Prostate cancer Father   . Heart disease Brother   . Lymphoma Brother     non and hodgkins  . Coronary artery disease Brother   . Osteoporosis Mother      History   Social History  . Marital Status: Married    Spouse Name: N/A    Number of Children: 1  . Years of Education: N/A   Occupational History  . Retired    Social History Main Topics  . Smoking status: Never Smoker   . Smokeless tobacco: Never Used  . Alcohol Use: No  . Drug Use: No  . Sexually Active: Not on file   Other Topics Concern  . Not on file   Social History Narrative  . No narrative on file     ROS Constitutional: Negative for fever, chills, diaphoresis, activity change, appetite change and fatigue.  HENT: Negative for hearing loss, nosebleeds, congestion, sore throat, facial swelling, drooling, trouble swallowing, neck pain, voice change, sinus pressure and tinnitus.  Eyes: Negative for photophobia, pain, discharge and visual disturbance.  Respiratory: Negative for apnea, cough, chest tightness, shortness of breath and wheezing.  Cardiovascular: Negative for  leg swelling.  Gastrointestinal: Negative for nausea, vomiting, abdominal pain, diarrhea, constipation, blood in  stool and abdominal distention.  Genitourinary: Negative for dysuria, urgency, frequency, hematuria and decreased urine volume.  Musculoskeletal: Negative for myalgias, back pain, joint swelling, arthralgias and gait problem.  Skin: Negative for color change, pallor, rash and wound.  Neurological: Negative for dizziness, tremors, seizures, syncope, speech difficulty, weakness, light-headedness, numbness and headaches.  Psychiatric/Behavioral: Negative for suicidal ideas, hallucinations, behavioral problems and agitation. The patient is not nervous/anxious.     PHYSICAL EXAM   BP 104/70  Pulse 64  Ht 5'  7" (1.702 m)  Wt 153 lb 12.8 oz (69.763 kg)  BMI 24.09 kg/m2 Constitutional: She is oriented to person, place, and time. She appears well-developed and well-nourished. No distress.  HENT: No nasal discharge.  Head: Normocephalic and atraumatic.  Eyes: Pupils are equal and round. Right eye exhibits no discharge. Left eye exhibits no discharge.  Neck: Normal range of motion. Neck supple. No JVD present. No thyromegaly present.  Cardiovascular: Normal rate, regular rhythm, normal heart sounds. Exam reveals no gallop and no friction rub. No murmur heard.  Pulmonary/Chest: Effort normal and breath sounds normal. No stridor. No respiratory distress. She has no wheezes. She has no rales. She exhibits no tenderness.  Abdominal: Soft. Bowel sounds are normal. She exhibits no distension. There is no tenderness. There is no rebound and no guarding.  Musculoskeletal: Normal range of motion. She exhibits no edema and no tenderness.  Neurological: She is alert and oriented to person, place, and time. Coordination normal.  Skin: Skin is warm and dry. No rash noted. She is not diaphoretic. No erythema. No pallor.  Psychiatric: She has a normal mood and affect. Her behavior is normal. Judgment and thought content normal.     EKG: Her recent EKG was reviewed which showed normal sinus rhythm with no significant ST or T wave changes. Normal PR and QT intervals.   ASSESSMENT AND PLAN

## 2012-05-24 ENCOUNTER — Telehealth: Payer: Self-pay | Admitting: Gastroenterology

## 2012-05-24 NOTE — Telephone Encounter (Signed)
Pt notified that no samples are available

## 2012-05-30 ENCOUNTER — Telehealth: Payer: Self-pay | Admitting: Cardiovascular Disease

## 2012-05-30 NOTE — Telephone Encounter (Signed)
Spoke w/pt to let her know her echo has been prior authorized by her insurance, [employer].  She will speak w/Ruth when she comes in for her monitor.

## 2012-05-30 NOTE — Telephone Encounter (Signed)
New msg Pt wants to know if echo and monitor has been approved by insurance. Please call her back

## 2012-05-31 ENCOUNTER — Encounter (INDEPENDENT_AMBULATORY_CARE_PROVIDER_SITE_OTHER): Payer: BC Managed Care – PPO

## 2012-05-31 ENCOUNTER — Ambulatory Visit (HOSPITAL_COMMUNITY): Payer: BC Managed Care – PPO | Attending: Cardiology | Admitting: Radiology

## 2012-05-31 DIAGNOSIS — R002 Palpitations: Secondary | ICD-10-CM

## 2012-05-31 NOTE — Progress Notes (Signed)
Echocardiogram performed.  

## 2012-06-01 ENCOUNTER — Telehealth: Payer: Self-pay | Admitting: Gastroenterology

## 2012-06-01 NOTE — Telephone Encounter (Signed)
Pt would like to start Align and asked for any coupons I will mail this out today

## 2012-06-09 ENCOUNTER — Telehealth: Payer: Self-pay

## 2012-06-09 ENCOUNTER — Telehealth: Payer: Self-pay | Admitting: Cardiovascular Disease

## 2012-06-09 NOTE — Telephone Encounter (Signed)
Pt notified and roi mailed to pt.

## 2012-06-09 NOTE — Telephone Encounter (Signed)
N/A.  LM that we need a roi to mail her these results.

## 2012-06-09 NOTE — Telephone Encounter (Signed)
Pt was notified of normal holter monitor results per Dr Kirke Corin.

## 2012-06-09 NOTE — Telephone Encounter (Signed)
Pt would like a copy of echo and holter monitor results mailed too her, and a call that you will do it, [phone]

## 2012-06-20 ENCOUNTER — Telehealth: Payer: Self-pay | Admitting: Gastroenterology

## 2012-06-20 NOTE — Telephone Encounter (Signed)
Samples left at front desk.  Pt notified and will pick up today

## 2012-08-05 ENCOUNTER — Telehealth: Payer: Self-pay | Admitting: Gastroenterology

## 2012-08-05 NOTE — Telephone Encounter (Signed)
Pt advised that we can't give any more samples at this time but we will be glad to send in a prescription or she can purchase OTC.  Pt agreed

## 2012-08-30 ENCOUNTER — Other Ambulatory Visit: Payer: Self-pay

## 2012-08-30 MED ORDER — OMEPRAZOLE 20 MG PO CPDR: 20.0000 mg | DELAYED_RELEASE_CAPSULE | Freq: Two times a day (BID) | ORAL | Status: DC

## 2012-09-07 ENCOUNTER — Telehealth: Payer: Self-pay | Admitting: Internal Medicine

## 2012-09-07 NOTE — Telephone Encounter (Signed)
Ok to add.  Use fatigue and osteopenia code

## 2012-09-07 NOTE — Telephone Encounter (Signed)
Pt req to get vit b-12 and vit d lvls added to [facility] labs on 09/21/12. Pls order.

## 2012-09-07 NOTE — Telephone Encounter (Signed)
Added to lab appt notes

## 2012-09-21 ENCOUNTER — Other Ambulatory Visit (INDEPENDENT_AMBULATORY_CARE_PROVIDER_SITE_OTHER): Payer: BC Managed Care – PPO

## 2012-09-21 DIAGNOSIS — R5381 Other malaise: Secondary | ICD-10-CM

## 2012-09-21 DIAGNOSIS — R5383 Other fatigue: Secondary | ICD-10-CM

## 2012-09-21 DIAGNOSIS — Z0000 Encounter for general adult medical examination without abnormal findings: Secondary | ICD-10-CM

## 2012-09-21 DIAGNOSIS — M899 Disorder of bone, unspecified: Secondary | ICD-10-CM

## 2012-09-21 LAB — LIPID PANEL
HDL: 43.6 mg/dL (ref >39.00)
Total CHOL/HDL Ratio: 4
Triglycerides: 45 mg/dL (ref 0.0–149.0)
VLDL: 9 mg/dL (ref 0.0–40.0)

## 2012-09-21 LAB — CBC WITH DIFFERENTIAL/PLATELET
Basophils Absolute: 0 10*3/uL (ref 0.0–0.1)
Hemoglobin: 14.1 g/dL (ref 12.0–15.0)
Lymphocytes Relative: 37.8 % (ref 12.0–46.0)
Monocytes Relative: 5.9 % (ref 3.0–12.0)
Platelets: 178 10*3/uL (ref 150.0–400.0)
RDW: 13 % (ref 11.5–14.6)

## 2012-09-21 LAB — HEPATIC FUNCTION PANEL
AST: 22 U/L (ref 0–37)
Alkaline Phosphatase: 40 U/L (ref 39–117)
Bilirubin, Direct: 0.1 mg/dL (ref 0.0–0.3)
Total Bilirubin: 0.8 mg/dL (ref 0.3–1.2)

## 2012-09-21 LAB — BASIC METABOLIC PANEL
BUN: 11 mg/dL (ref 6–23)
Calcium: 9.3 mg/dL (ref 8.4–10.5)
GFR: 88.03 mL/min (ref >60.00)
Glucose, Bld: 78 mg/dL (ref 70–99)
Sodium: 141 mEq/L (ref 135–145)

## 2012-09-21 LAB — POCT URINALYSIS DIPSTICK
Bilirubin, UA: NEGATIVE
Ketones, UA: NEGATIVE
pH, UA: 7

## 2012-09-22 LAB — VITAMIN B12: Vitamin B-12: 403 pg/mL (ref 211–911)

## 2012-10-12 ENCOUNTER — Ambulatory Visit (INDEPENDENT_AMBULATORY_CARE_PROVIDER_SITE_OTHER): Payer: BC Managed Care – PPO | Admitting: Internal Medicine

## 2012-10-12 ENCOUNTER — Other Ambulatory Visit: Payer: Self-pay

## 2012-10-12 ENCOUNTER — Encounter: Payer: Self-pay | Admitting: Internal Medicine

## 2012-10-12 ENCOUNTER — Encounter: Payer: Self-pay | Admitting: Gastroenterology

## 2012-10-12 ENCOUNTER — Ambulatory Visit (INDEPENDENT_AMBULATORY_CARE_PROVIDER_SITE_OTHER): Payer: BC Managed Care – PPO | Admitting: Gastroenterology

## 2012-10-12 VITALS — BP 100/70 | HR 64 | Ht 67.0 in | Wt 149.8 lb

## 2012-10-12 VITALS — BP 124/74 | HR 76 | Temp 98.2°F | Ht 67.0 in | Wt 150.0 lb

## 2012-10-12 DIAGNOSIS — C20 Malignant neoplasm of rectum: Secondary | ICD-10-CM

## 2012-10-12 DIAGNOSIS — K3189 Other diseases of stomach and duodenum: Secondary | ICD-10-CM

## 2012-10-12 DIAGNOSIS — R1013 Epigastric pain: Secondary | ICD-10-CM

## 2012-10-12 DIAGNOSIS — Z0000 Encounter for general adult medical examination without abnormal findings: Secondary | ICD-10-CM

## 2012-10-12 NOTE — Patient Instructions (Addendum)
Consider reglan at bedtime. Consider gastric emptying scan.  Please call Dr. Christella Hartigan. Continue to avoid a high fat diet. Call with any further questions or concerns.

## 2012-10-12 NOTE — Assessment & Plan Note (Signed)
Reviewed adult health maintenance protocols.  Patient up-to-date with Pap and pelvic and mammogram as per her GYN.  Patient updated with influenza vaccine.

## 2012-10-12 NOTE — Progress Notes (Signed)
Review of gastrointestinal problems:  1. GERD: EGD 2005 (Dr. Ewing Schlein) this was normal. EGD 9/09 Christella Hartigan), mild-nonspec gastritis, H. pylori Negative. Perhaps Contributing to intermittent chest discomforts. Cardiac workup 2010 negative. Symptoms improved with Carafate. Pyrosis usually under good control with  2. Routine risk fo colon cancer: Colonoscopy 2005 (Dr. Ewing Schlein) this was normal.  3. Dyspepsia, intermittent abd pains: 2013 ultrasound showed small gallbladder polyp.   HPI: This is a  very pleasant 61 year old woman whom I last saw about 8 months ago.  Did not try reglan due to concern of side effects.    Her insurance is changing in several weeks.   She is bothered by occasionally waking up with heavy feeling in chest. She only has symptoms in her chest.  Can have nausea at times.  Overall her weight is down slightly, intentionally.  No dysphagia.  No overt GI bleeding.  She eats dinner by 6pm, nothing afterwards except a yogurt at 7:30.    No caffeine.  Liquid maalox helped after an hour.  Taking PPI bid before BF, dinner meals.  Stopped H2 blocker due to fluttering in her chest, and arythmia is on list of side effects  propulcid and prilosec in the past.     Past Medical History  Diagnosis Date  . Excess or deficiency of vitamin D   . Basal cell carcinoma   . Microhematuria   . GERD (gastroesophageal reflux disease)   . Osteopenia   . Atypical chest pain     Past Surgical History  Procedure Date  . Lung biopsy     Current Outpatient Prescriptions  Medication Sig Dispense Refill  . Alum & Mag Hydroxide-Simeth (ANTACID LIQUID PO) Take by mouth as needed.       Marland Kitchen estradiol (ESTRACE VAGINAL) 0.1 MG/GM vaginal cream Place vaginally as directed.        Marland Kitchen omeprazole (PRILOSEC) 20 MG capsule Take 1 capsule (20 mg total) by mouth 2 (two) times daily.  180 capsule  3  . tretinoin (RETIN-A) 0.05 % cream Apply 1 application topically 4 (four) times a week.        Allergies  as of 10/12/2012  . (No Known Allergies)    Family History  Problem Relation Age of Onset  . COPD Mother   . Prostate cancer Father   . Heart disease Brother   . Lymphoma Brother     non and hodgkins  . Coronary artery disease Brother   . Osteoporosis Mother     History   Social History  . Marital Status: Married    Spouse Name: N/A    Number of Children: 1  . Years of Education: N/A   Occupational History  . Retired    Social History Main Topics  . Smoking status: Never Smoker   . Smokeless tobacco: Never Used  . Alcohol Use: No  . Drug Use: No  . Sexually Active: Not on file   Other Topics Concern  . Not on file   Social History Narrative  . No narrative on file      Physical Exam: BP 100/70  Pulse 64  Ht 5\' 7"  (1.702 m)  Wt 149 lb 12.8 oz (67.949 kg)  BMI 23.46 kg/m2 Constitutional: generally well-appearing Psychiatric: alert and oriented x3 Abdomen: soft, nontender, nondistended, no obvious ascites, no peritoneal signs, normal bowel sounds     Assessment and plan: 61 y.o. female with intermittent, evening chest discomfort, dyspepsia, chronic GERD  She has been seen by cardiologist and  it unlikely that her chest discomforts are related to significant cardiac disease. Think this is more functional GI related symptoms. She spoke again about gastric emptying test that was done 18-20 years ago that showed slow stomach. We were unable to track down those  results. She had started on Propulsid around that time and her symptoms improved. Perhaps he does have gastroparesis which is contributing to her dyspepsia, GERD. I offered gastric emptying scan. She will consider that and get back in touch. I also again recommended a trial of Reglan, nightly at bedtime. I explained that the dose will be quite low, and at her age it would be very unlikely she would have any significant side effects. She is a very bright, thoughtful patient and she will consider all of this and  will get back in touch with me.

## 2012-10-12 NOTE — Progress Notes (Signed)
Subjective:    Patient ID: Elizabeth Douglas, female    DOB: March 06, 1951, 61 y.o.   MRN: [id number]  HPI  61 year old white female with history of heart palpitations and atypical chest pain for routine physical. Overall patient has been doing well. She was seen by cardiologist. We reviewed 2-D echocardiogram and Holter monitor. 2-D echo showed normal systolic ejection fraction. No wall motion abnormalities detected. However, patient noted to have diastolic dysfunction. 48 hour Holter monitor showed PACs. Her palpitations have significantly decreased since stopping caffeine use.  She only has dyspnea with climbing stairs.  She exercises on a regular basis w/o difficulty.  She still has intermittent GERD despite taking omeprazole 20 mg twice daily. Her gastroenterologist suspect she may have gastroparesis. She is reluctant to take Reglan due to potential side effects.  Review of Systems  Constitutional: Negative for activity change, appetite change and unexpected weight change.  Eyes: Negative for visual disturbance.  Respiratory: Negative for cough, chest tightness and shortness of breath.   Cardiovascular: Negative for chest pain.  Genitourinary: Negative for difficulty urinating.  Neurological: Negative for headaches.  Gastrointestinal: Negative for melena or hematochezia Psych: Negative for depression or anxiety  Past Medical History  Diagnosis Date  . Excess or deficiency of vitamin D   . Basal cell carcinoma   . Microhematuria   . GERD (gastroesophageal reflux disease)   . Osteopenia   . Atypical chest pain     History   Social History  . Marital Status: Married    Spouse Name: N/A    Number of Children: 1  . Years of Education: N/A   Occupational History  . Retired    Social History Main Topics  . Smoking status: Never Smoker   . Smokeless tobacco: Never Used  . Alcohol Use: No  . Drug Use: No  . Sexually Active: Not on file   Other Topics Concern  . Not on file    Social History Narrative  . No narrative on file    Past Surgical History  Procedure Date  . Lung biopsy     Family History  Problem Relation Age of Onset  . COPD Mother   . Prostate cancer Father   . Heart disease Brother   . Lymphoma Brother     non and hodgkins  . Coronary artery disease Brother   . Osteoporosis Mother     No Known Allergies  Current Outpatient Prescriptions on File Prior to Visit  Medication Sig Dispense Refill  . Alum & Mag Hydroxide-Simeth (ANTACID LIQUID PO) Take by mouth as needed.       Marland Kitchen estradiol (ESTRACE VAGINAL) 0.1 MG/GM vaginal cream Place vaginally as directed.        Marland Kitchen omeprazole (PRILOSEC) 20 MG capsule Take 1 capsule (20 mg total) by mouth 2 (two) times daily.  180 capsule  3  . tretinoin (RETIN-A) 0.05 % cream Apply 1 application topically 4 (four) times a week.        BP 124/74  Pulse 76  Temp 98.2 F (36.8 C) (Oral)  Ht 5\' 7"  (1.702 m)  Wt 150 lb (68.04 kg)  BMI 23.49 kg/m2           Objective:   Physical Exam  Constitutional: She is oriented to person, place, and time. She appears well-developed and well-nourished.  HENT:  Head: Normocephalic.  Right Ear: External ear normal.  Left Ear: External ear normal.  Mouth/Throat: Oropharynx is clear and moist.  Eyes: EOM are  normal.  Neck: Neck supple.       No carotid bruit  Cardiovascular: Normal rate, regular rhythm, normal heart sounds and intact distal pulses.   No murmur heard. Pulmonary/Chest: Effort normal and breath sounds normal. She has no wheezes.  Abdominal: Soft. Bowel sounds are normal. She exhibits no mass. There is no tenderness.  Musculoskeletal: She exhibits no edema.  Lymphadenopathy:    She has no cervical adenopathy.  Neurological: She is alert and oriented to person, place, and time. No cranial nerve deficit.  Skin: Skin is warm and dry.  Psychiatric: She has a normal mood and affect. Her behavior is normal.          Assessment & Plan:

## 2013-01-14 ENCOUNTER — Other Ambulatory Visit: Payer: Self-pay

## 2013-08-10 ENCOUNTER — Other Ambulatory Visit: Payer: Self-pay | Admitting: Gastroenterology

## 2013-08-24 ENCOUNTER — Telehealth: Payer: Self-pay | Admitting: Internal Medicine

## 2013-08-24 NOTE — Telephone Encounter (Signed)
Put her in the 4:15 slot on Dr Olegario Messier schedule but she can come in at 10:45

## 2013-08-24 NOTE — Telephone Encounter (Signed)
Pt needs flu vac on Tues, 9/30. No clinic Can she come in at your 10:45 slot, even though this is a yoo pt?

## 2013-08-25 NOTE — Telephone Encounter (Signed)
done

## 2013-08-29 ENCOUNTER — Ambulatory Visit: Payer: BC Managed Care – PPO | Admitting: Internal Medicine

## 2013-09-12 ENCOUNTER — Encounter: Payer: Self-pay | Admitting: Internal Medicine

## 2013-09-26 ENCOUNTER — Ambulatory Visit (INDEPENDENT_AMBULATORY_CARE_PROVIDER_SITE_OTHER): Payer: BC Managed Care – PPO

## 2013-09-26 DIAGNOSIS — Z23 Encounter for immunization: Secondary | ICD-10-CM

## 2013-10-18 ENCOUNTER — Telehealth: Payer: Self-pay | Admitting: Internal Medicine

## 2013-10-18 NOTE — Telephone Encounter (Signed)
Labs added.

## 2013-10-18 NOTE — Telephone Encounter (Signed)
Ok to add.  Use osteopenia code and macrocytosis code

## 2013-10-18 NOTE — Telephone Encounter (Signed)
Pt would like to add vit b 12 and vit d to cpx labs on 11-01-13. Can I add?

## 2013-11-01 ENCOUNTER — Other Ambulatory Visit (INDEPENDENT_AMBULATORY_CARE_PROVIDER_SITE_OTHER): Payer: BC Managed Care – PPO

## 2013-11-01 DIAGNOSIS — Z0000 Encounter for general adult medical examination without abnormal findings: Secondary | ICD-10-CM

## 2013-11-01 LAB — POCT URINALYSIS DIPSTICK
Bilirubin, UA: NEGATIVE
Glucose, UA: NEGATIVE
Leukocytes, UA: NEGATIVE
Nitrite, UA: NEGATIVE
Urobilinogen, UA: 0.2
pH, UA: 6.5

## 2013-11-01 LAB — CBC WITH DIFFERENTIAL/PLATELET
Basophils Relative: 0.1 % (ref 0.0–3.0)
Eosinophils Absolute: 0.1 10*3/uL (ref 0.0–0.7)
HCT: 40.6 % (ref 36.0–46.0)
Hemoglobin: 13.9 g/dL (ref 12.0–15.0)
MCHC: 34.2 g/dL (ref 30.0–36.0)
MCV: 93.8 fl (ref 78.0–100.0)
Monocytes Absolute: 0.4 10*3/uL (ref 0.1–1.0)
Neutro Abs: 1.8 10*3/uL (ref 1.4–7.7)
RBC: 4.33 Mil/uL (ref 3.87–5.11)

## 2013-11-01 LAB — BASIC METABOLIC PANEL
CO2: 30 mEq/L (ref 19–32)
Chloride: 105 mEq/L (ref 96–112)
Sodium: 141 mEq/L (ref 135–145)

## 2013-11-01 LAB — LIPID PANEL: Total CHOL/HDL Ratio: 3

## 2013-11-01 LAB — HEPATIC FUNCTION PANEL
AST: 20 U/L (ref 0–37)
Alkaline Phosphatase: 41 U/L (ref 39–117)
Bilirubin, Direct: 0 mg/dL (ref 0.0–0.3)

## 2013-11-01 LAB — VITAMIN B12: Vitamin B-12: 379 pg/mL (ref 211–911)

## 2013-11-02 LAB — VITAMIN D 25 HYDROXY (VIT D DEFICIENCY, FRACTURES): Vit D, 25-Hydroxy: 35 ng/mL (ref 30–89)

## 2013-11-08 ENCOUNTER — Ambulatory Visit (INDEPENDENT_AMBULATORY_CARE_PROVIDER_SITE_OTHER): Payer: BC Managed Care – PPO | Admitting: Internal Medicine

## 2013-11-08 ENCOUNTER — Encounter: Payer: Self-pay | Admitting: Internal Medicine

## 2013-11-08 VITALS — BP 120/80 | HR 72 | Temp 98.1°F | Ht 67.5 in | Wt 150.0 lb

## 2013-11-08 DIAGNOSIS — Z0000 Encounter for general adult medical examination without abnormal findings: Secondary | ICD-10-CM

## 2013-11-08 DIAGNOSIS — D696 Thrombocytopenia, unspecified: Secondary | ICD-10-CM

## 2013-11-08 NOTE — Progress Notes (Signed)
Pre visit review using our clinic review tool, if applicable. No additional management support is needed unless otherwise documented below in the visit note. 

## 2013-11-08 NOTE — Progress Notes (Signed)
Subjective:    Patient ID: Elizabeth Douglas, female    DOB: [birth year], 62 y.o.   MRN: [id number]  HPI  62 year old white female with history of gastroesophageal reflux disease and vitamin D deficiency for routine physical. She denies any significant interval medical history. She takes her Prilosec a regular basis. She had screening bone density scan several years ago.  She is followed by her gynecologist. Her Pap and pelvic was deferred this year. Her mammogram is normal. Patient is having issues with minor external hemorrhoid. She denies any rectal bleeding. She has been using over-the-counter hemorrhoid creams. Her last screening colonoscopy was approximately 10 years ago. She reports it was completely normal exam. She would like to return to Dr. Christella Hartigan for followup screening colonoscopy.  Screening blood work reviewed in detail with patient.  Review of Systems  Constitutional: Negative for activity change, appetite change and unexpected weight change.  Eyes: Negative for visual disturbance.  Respiratory: Negative for cough, chest tightness and shortness of breath.   Cardiovascular: Negative for chest pain.  Genitourinary: Negative for difficulty urinating.  Neurological: Negative for headaches.  Gastrointestinal: Negative for abdominal pain, heartburn melena or hematochezia Psych: Negative for depression or anxiety Endo:  No polyuria or polydypsia        Past Medical History  Diagnosis Date  . Excess or deficiency of vitamin D   . Basal cell carcinoma   . Microhematuria   . GERD (gastroesophageal reflux disease)   . Osteopenia   . Atypical chest pain     History   Social History  . Marital Status: Married    Spouse Name: N/A    Number of Children: 1  . Years of Education: N/A   Occupational History  . Retired    Social History Main Topics  . Smoking status: Never Smoker   . Smokeless tobacco: Never Used  . Alcohol Use: No  . Drug Use: No  . Sexual Activity: Not  on file   Other Topics Concern  . Not on file   Social History Narrative  . No narrative on file    Past Surgical History  Procedure Laterality Date  . Lung biopsy      Family History  Problem Relation Age of Onset  . COPD Mother   . Prostate cancer Father   . Heart disease Brother   . Lymphoma Brother     non and hodgkins  . Coronary artery disease Brother   . Osteoporosis Mother     No Known Allergies  Current Outpatient Prescriptions on File Prior to Visit  Medication Sig Dispense Refill  . estradiol (ESTRACE VAGINAL) 0.1 MG/GM vaginal cream Place vaginally as directed.        Marland Kitchen omeprazole (PRILOSEC) 20 MG capsule TAKE 1 CAPSULE (20 MG TOTAL)  BY MOUTH 2 (TWO) TIMES DAILY.  180 capsule  2  . tretinoin (RETIN-A) 0.05 % cream Apply 1 application topically 4 (four) times a week.       No current facility-administered medications on file prior to visit.    BP 120/80  Pulse 72  Temp(Src) 98.1 F (36.7 C) (Oral)  Ht 5' 7.5" (1.715 m)  Wt 150 lb (68.04 kg)  BMI 23.13 kg/m2    Objective:   Physical Exam  Constitutional: She is oriented to person, place, and time. She appears well-developed and well-nourished. No distress.  HENT:  Head: Normocephalic and atraumatic.  Right Ear: External ear normal.  Left Ear: External ear normal.  Mouth/Throat: Oropharynx  is clear and moist.  Eyes: Conjunctivae and EOM are normal. Pupils are equal, round, and reactive to light. Left eye exhibits no discharge. No scleral icterus.  Neck: Neck supple.  No carotid bruit  Cardiovascular: Normal rate, regular rhythm and normal heart sounds.   No murmur heard. Pulmonary/Chest: Effort normal and breath sounds normal. She has no wheezes.  Abdominal: Soft. Bowel sounds are normal. There is no tenderness.  Small external hemorrhoid  Musculoskeletal: Normal range of motion. She exhibits no edema.  Lymphadenopathy:    She has no cervical adenopathy.  Neurological: She is alert and  oriented to person, place, and time. She displays normal reflexes. No cranial nerve deficit.  Skin: Skin is warm and dry.  Psychiatric: She has a normal mood and affect. Her behavior is normal.          Assessment & Plan:

## 2013-11-08 NOTE — Assessment & Plan Note (Signed)
Patient has chronic intermittent mild thrombocytopenia. Continue to monitor yearly. Patient is asymptomatic.

## 2013-11-08 NOTE — Assessment & Plan Note (Signed)
Reviewed adult health maintenance protocols.  Patient to check with her insurance company regarding reimbursement for Zostavax. Arrange followup colonoscopy with Dr. Christella Hartigan. Arrange screening DEXA scan.  Screening blood work reviewed.

## 2013-11-08 NOTE — Patient Instructions (Signed)
Please complete the following lab tests before your next follow up appointment: CPX labs Our office will contact you Re: referral for bone density scan and referral to Dr. Christella Hartigan for colonoscopy Use sitz bath as directed and psyllium fiber laxative daily

## 2013-12-06 ENCOUNTER — Other Ambulatory Visit: Payer: BC Managed Care – PPO

## 2013-12-26 ENCOUNTER — Ambulatory Visit (INDEPENDENT_AMBULATORY_CARE_PROVIDER_SITE_OTHER): Payer: BC Managed Care – PPO | Admitting: Gastroenterology

## 2013-12-26 ENCOUNTER — Encounter: Payer: Self-pay | Admitting: Gastroenterology

## 2013-12-26 ENCOUNTER — Ambulatory Visit (INDEPENDENT_AMBULATORY_CARE_PROVIDER_SITE_OTHER)
Admission: RE | Admit: 2013-12-26 | Discharge: 2013-12-26 | Disposition: A | Discharge status: Home / Self Care | Payer: BC Managed Care – PPO | Source: Ambulatory Visit | Attending: Internal Medicine | Admitting: Internal Medicine

## 2013-12-26 VITALS — BP 94/64 | HR 80 | Ht 67.0 in | Wt 150.4 lb

## 2013-12-26 DIAGNOSIS — Z1211 Encounter for screening for malignant neoplasm of colon: Secondary | ICD-10-CM

## 2013-12-26 DIAGNOSIS — K649 Unspecified hemorrhoids: Secondary | ICD-10-CM

## 2013-12-26 DIAGNOSIS — Z0000 Encounter for general adult medical examination without abnormal findings: Secondary | ICD-10-CM

## 2013-12-26 DIAGNOSIS — K6289 Other specified diseases of anus and rectum: Secondary | ICD-10-CM

## 2013-12-26 MED ORDER — MOVIPREP 100 G PO SOLR
1.0000 | Freq: Once | ORAL | Status: DC
Start: 2013-12-26 — End: 2014-01-08

## 2013-12-26 NOTE — Progress Notes (Signed)
Review of gastrointestinal problems:  1. GERD: EGD 2005 (Dr. Watt Climes) this was normal. EGD 9/09 Ardis Hughs), mild-nonspec gastritis, H. pylori Negative. Perhaps Contributing to intermittent chest discomforts. Cardiac workup 2010 negative. Symptoms improved with Carafate. Pyrosis usually under good control with  2. Routine risk fo colon cancer: Colonoscopy 2005 (Dr. Watt Climes) this was normal.  3. Dyspepsia, intermittent abd pains: 2013 ultrasound showed small gallbladder polyp.  HPI: This is a   very pleasant 63 year old woman whom I last saw about a year and half ago. Normal hb, plts last month  For about three months, perianal discomfort. Has to sit to the side.  She does have small external hmorrhoid that she knows about it.  Tried epsom salt, hemorrhoid cream without relief.    Tried anusol suppository this may have helped transiently.  She had colonoscopy 10 years ago.  No anal bleeding.  Maybe a bit of constipation, but that resolved.  Review of systems: Pertinent positive and negative review of systems were noted in the above HPI section. Complete review of systems was performed and was otherwise normal.    Past Medical History  Diagnosis Date  . Excess or deficiency of vitamin D   . Basal cell carcinoma   . Microhematuria   . GERD (gastroesophageal reflux disease)   . Osteopenia   . Atypical chest pain     Past Surgical History  Procedure Laterality Date  . Lung biopsy      Current Outpatient Prescriptions  Medication Sig Dispense Refill  . Cholecalciferol (VITAMIN D) 2000 UNITS CAPS Take 1 capsule by mouth daily.      Marland Kitchen estradiol (ESTRACE VAGINAL) 0.1 MG/GM vaginal cream Place vaginally as directed.        Marland Kitchen omeprazole (PRILOSEC) 20 MG capsule TAKE 1 CAPSULE (20 MG TOTAL)  BY MOUTH 2 (TWO) TIMES DAILY.  180 capsule  2  . tretinoin (RETIN-A) 0.05 % cream Apply 1 application topically 4 (four) times a week.       No current facility-administered medications for this visit.     Allergies as of 12/26/2013  . (No Known Allergies)    Family History  Problem Relation Age of Onset  . COPD Mother   . Prostate cancer Father   . Heart disease Brother   . Lymphoma Brother     non and hodgkins  . Coronary artery disease Brother   . Osteoporosis Mother     History   Social History  . Marital Status: Married    Spouse Name: N/A    Number of Children: 1  . Years of Education: N/A   Occupational History  . Retired    Social History Main Topics  . Smoking status: Never Smoker   . Smokeless tobacco: Never Used  . Alcohol Use: No  . Drug Use: No  . Sexual Activity: Not on file   Other Topics Concern  . Not on file   Social History Narrative  . No narrative on file       Physical Exam: BP 94/64  Pulse 80  Ht 5\' 7"  (1.702 m)  Wt 150 lb 6 oz (68.21 kg)  BMI 23.55 kg/m2 Constitutional: generally well-appearing Psychiatric: alert and oriented x3 Eyes: extraocular movements intact Mouth: oral pharynx moist, no lesions Neck: supple no lymphadenopathy Cardiovascular: heart regular rate and rhythm Lungs: clear to auscultation bilaterally Abdomen: soft, nontender, nondistended, no obvious ascites, no peritoneal signs, normal bowel sounds Extremities: no lower extremity edema bilaterally Skin: no lesions on visible extremities Rectal  examination with female assistant in the room: Small, nonthrombosed 3-4 mm right sided external anal hemorrhoid versus small skin today the anus was otherwise normal. No clear seizures, internal examination suggested possible smooth internal hemorrhoids.   Assessment and plan: 63 y.o. female with   anal discomfort for 3 months   I suspect she is bothered by continued hydrocortisone application to her anus or perhaps from some small internal anal hemorrhoids. I recommended she stop the hydrocortisone see how she feels. She is due for colon cancer screening with colonoscopy and we will schedule that to be done at her  soonest convenience as well.

## 2013-12-26 NOTE — Patient Instructions (Signed)
You will be set up for a colonoscopy for routine screening. Stop hydrocortisone oinments to your backside. Sitz baths once to twice daily.

## 2013-12-27 ENCOUNTER — Encounter: Payer: Self-pay | Admitting: Gastroenterology

## 2014-01-08 ENCOUNTER — Ambulatory Visit (AMBULATORY_SURGERY_CENTER): Payer: BC Managed Care – PPO | Admitting: Gastroenterology

## 2014-01-08 ENCOUNTER — Encounter: Payer: Self-pay | Admitting: Gastroenterology

## 2014-01-08 VITALS — BP 112/71 | HR 67 | Temp 97.4°F | Resp 15 | Ht 67.0 in | Wt 150.0 lb

## 2014-01-08 DIAGNOSIS — K648 Other hemorrhoids: Secondary | ICD-10-CM

## 2014-01-08 DIAGNOSIS — Z1211 Encounter for screening for malignant neoplasm of colon: Secondary | ICD-10-CM

## 2014-01-08 MED ORDER — SODIUM CHLORIDE 0.9 % IV SOLN: 500.0000 mL | INTRAVENOUS | Status: DC

## 2014-01-08 NOTE — Op Note (Signed)
[facility]  [address]. [place], [address]   COLONOSCOPY PROCEDURE REPORT  PATIENT: Elizabeth Douglas, Elizabeth Douglas  MR#: [id number] BIRTHDATE: 05-29-1951 , 62  yrs. old GENDER: Female ENDOSCOPIST: Milus Banister, MD PROCEDURE DATE:  01/08/2014 PROCEDURE:   Colonoscopy, diagnostic First Screening Colonoscopy - Avg.  risk and is 50 yrs.  old or older - No.  Prior Negative Screening - Now for repeat screening. 10 or more years since last screening  History of Adenoma - Now for follow-up colonoscopy & has been > or = to 3 yrs.  N/A  Polyps Removed Today? No.  Recommend repeat exam, <10 yrs? No. ASA CLASS:   Class II INDICATIONS:average risk screening. MEDICATIONS: Fentanyl 50 mcg IV, Versed 7 mg IV, and These medications were titrated to patient response per physician's verbal order  DESCRIPTION OF PROCEDURE:   After the risks benefits and alternatives of the procedure were thoroughly explained, informed consent was obtained.  A digital rectal exam revealed no abnormalities of the rectum.   The LB [id number]  endoscope was introduced through the anus and advanced to the cecum, which was identified by both the appendix and ileocecal valve. No adverse events experienced.   The quality of the prep was excellent.  The instrument was then slowly withdrawn as the colon was fully examined.  COLON FINDINGS: A normal appearing cecum, ileocecal valve, and appendiceal orifice were identified.  The ascending, hepatic flexure, transverse, splenic flexure, descending, sigmoid colon and rectum appeared unremarkable.  No polyps or cancers were seen. Retroflexed views revealed internal hemorrhoids. The time to cecum=3 minutes 40 seconds.  Withdrawal time=9 minutes 20 seconds. The scope was withdrawn and the procedure completed. COMPLICATIONS: There were no complications.  ENDOSCOPIC IMPRESSION: Normal colonexcept for small to medium sized internal hemorrhoids. These are probably  the cause of your anal discomfort. No polyps or cancers.  RECOMMENDATIONS: 1.  My office will arrange for you to meet Dr.  Carlean Purl to consider hemorrhoidal ligation. 2.  You should continue to follow colorectal cancer screening guidelines for "routine risk" patients with a repeat colonoscopy in 10 years.  There is no need for FOBT (stool) testing for at least 5 years.   eSigned:  Milus Banister, MD 01/08/2014 3:57 PM   cc: Freddie Apley, DO

## 2014-01-08 NOTE — Patient Instructions (Signed)
Discharge instructions given with verbal understanding. Handouts on hemorrhoids and a high fiber diet. Resume previous medications. YOU HAD AN ENDOSCOPIC PROCEDURE TODAY AT THE [facility]: Refer to the procedure report that was given to you for any specific questions about what was found during the examination.  If the procedure report does not answer your questions, please call your gastroenterologist to clarify.  If you requested that your care partner not be given the details of your procedure findings, then the procedure report has been included in a sealed envelope for you to review at your convenience later.  YOU SHOULD EXPECT: Some feelings of bloating in the abdomen. Passage of more gas than usual.  Walking can help get rid of the air that was put into your GI tract during the procedure and reduce the bloating. If you had a lower endoscopy (such as a colonoscopy or flexible sigmoidoscopy) you may notice spotting of blood in your stool or on the toilet paper. If you underwent a bowel prep for your procedure, then you may not have a normal bowel movement for a few days.  DIET: Your first meal following the procedure should be a light meal and then it is ok to progress to your normal diet.  A half-sandwich or bowl of soup is an example of a good first meal.  Heavy or fried foods are harder to digest and may make you feel nauseous or bloated.  Likewise meals heavy in dairy and vegetables can cause extra gas to form and this can also increase the bloating.  Drink plenty of fluids but you should avoid alcoholic beverages for 24 hours.  ACTIVITY: Your care partner should take you home directly after the procedure.  You should plan to take it easy, moving slowly for the rest of the day.  You can resume normal activity the day after the procedure however you should NOT DRIVE or use heavy machinery for 24 hours (because of the sedation medicines used during the test).    SYMPTOMS TO REPORT  IMMEDIATELY: A gastroenterologist can be reached at any hour.  During normal business hours, 8:30 AM to 5:00 PM Monday through Friday, call [phone].  After hours and on weekends, please call the GI answering service at [phone] who will take a message and have the physician on call contact you.   Following lower endoscopy (colonoscopy or flexible sigmoidoscopy):  Excessive amounts of blood in the stool  Significant tenderness or worsening of abdominal pains  Swelling of the abdomen that is new, acute  Fever of 100F or higher FOLLOW UP: If any biopsies were taken you will be contacted by phone or by letter within the next 1-3 weeks.  Call your gastroenterologist if you have not heard about the biopsies in 3 weeks.  Our staff will call the home number listed on your records the next business day following your procedure to check on you and address any questions or concerns that you may have at that time regarding the information given to you following your procedure. This is a courtesy call and so if there is no answer at the home number and we have not heard from you through the emergency physician on call, we will assume that you have returned to your regular daily activities without incident.  SIGNATURES/CONFIDENTIALITY: You and/or your care partner have signed paperwork which will be entered into your electronic medical record.  These signatures attest to the fact that that the information above on your After   After Visit Summary has been reviewed and is understood.  Full responsibility of the confidentiality of this discharge information lies with you and/or your care-partner.

## 2014-01-09 ENCOUNTER — Telehealth: Payer: Self-pay | Admitting: *Deleted

## 2014-01-09 NOTE — Telephone Encounter (Signed)
  Follow up Call-  Call back number 01/08/2014  Post procedure Call Back phone  # [phone]  Permission to leave phone message Yes     Patient questions:  Do you have a fever, pain , or abdominal swelling? no Pain Score  0 *  Have you tolerated food without any problems? yes  Have you been able to return to your normal activities? yes  Do you have any questions about your discharge instructions: Diet   no Medications  no Follow up visit  no  Do you have questions or concerns about your Care? no  Actions: * If pain score is 4 or above: No action needed, pain <4.

## 2014-01-15 ENCOUNTER — Telehealth: Payer: Self-pay | Admitting: Gastroenterology

## 2014-01-15 MED ORDER — FLUCONAZOLE 100 MG PO TABS
100.0000 mg | ORAL_TABLET | Freq: Every day | ORAL | Status: DC
Start: 2014-01-15 — End: 2014-01-29

## 2014-01-15 NOTE — Telephone Encounter (Signed)
Pt has been scheduled to discuss banding with Dr Carlean Purl, also wants to see if she can have diflucan for rectal yeast.  She states she had 1 diflucan at home that she took and it seemed to help.

## 2014-01-15 NOTE — Telephone Encounter (Signed)
Ok,  Please prescribe diflucan 100mg  pills, one pill once daily for 7 days.  7 pills, no refills.

## 2014-01-15 NOTE — Telephone Encounter (Signed)
Pt aware and rx sent 

## 2014-01-29 ENCOUNTER — Encounter: Payer: Self-pay | Admitting: Internal Medicine

## 2014-01-29 ENCOUNTER — Ambulatory Visit (INDEPENDENT_AMBULATORY_CARE_PROVIDER_SITE_OTHER): Payer: BC Managed Care – PPO | Admitting: Internal Medicine

## 2014-01-29 VITALS — BP 110/62 | HR 74 | Ht 67.0 in | Wt 147.0 lb

## 2014-01-29 DIAGNOSIS — K602 Anal fissure, unspecified: Secondary | ICD-10-CM | POA: Insufficient documentation

## 2014-01-29 MED ORDER — DILTIAZEM GEL 2 %: 1.0000 "application " | Freq: Two times a day (BID) | CUTANEOUS | Status: DC

## 2014-01-29 NOTE — Patient Instructions (Addendum)
Today you have been given a handout on anal fissures to read.  We have sent the following medications to your pharmacy for you to pick up at your convenience: Dilitazem gel  Today you have been given a handout to read and follow on benefiber.   I appreciate the opportunity to care for you.

## 2014-01-29 NOTE — Assessment & Plan Note (Signed)
I told her that I suspect that this is the cause of her problems though sometimes this can be difficult to prove. There was a slight depression in the posterior anal area that I think is consistent with a subtle fissure. We'll treat with diltiazem gel twice a day into the anal canal. I've explained how to apply it. Sitz baths, Benefiber supplementation and followup in 6-8 weeks. Anal fissure handout provided.

## 2014-01-29 NOTE — Progress Notes (Signed)
         Subjective:    Patient ID: Elizabeth Douglas, female    DOB: 31-Aug-1951, 63 y.o.   MRN: [id number]  HPI The patient is here for followup of rectal pain. She had seen my partner, Dr. Ardis Hughs, I reviewed that note. He performed a screening colonoscopy which she was due for which did show some internal and external hemorrhoids. He asked me to see her regarding possible hemorrhoid treatment.  Her story is that in September she started to have some itching in the anal area, and was told by her gynecologist she had external hemorrhoid problems. She was treated with some topical cortisone. When she saw Dr. Ardis Hughs, he asked her to stop that therapy and then performed a colonoscopy and gave her a seven-day course of Diflucan for the possibility of yeast robins. Itching seems to have subsided. However she does have pain described as mild, in the posterior rectal area near the coccyx. When she sits, she has to sit to one side or the other to relieve the pain. She denies any pain with defecation or rectal bleeding. She is using cox past now. The pain is there most days but not necessarily every day it does not bother her with sleep.  She has performed to somewhat soft bowel movements most  days without straining, denies sitting on the commode for long given 2 minutes. Denies any prolapse.  She has not had any injury to the rectal area or low back.  Medications, allergies, past medical history, past surgical history, family history and social history are reviewed and updated in the EMR.  Review of Systems As above    Objective:   Physical Exam Is a pleasant well-developed middle-aged white woman in no acute distress  Rectal exam with female chaperone present: Anoderm inspection revealed a small anal tag in the right anterior position. Anal wink was resonant Digital exam revealed normal resting tone and voluntary squeeze. No mass or rectocele present. There is no significant tenderness on rectal  exam. There is no perianal tenderness, no tenderness in the buttocks. No tenderness over the lower sacrum and coccyx. Simulated defecation with valsalva revealed appropriate abdominal contraction and descent. The coccyx is nontender and nonmobile on internal exam.    Anoscopy was performed with the patient in the left lateral decubitus position while a chaperone was present and revealed what I think is a subtle posterior anal fissure. She does have small internal/external slightly inflamed hemorrhoids in all positions.     Assessment & Plan:   1. Anal fissure - posterior    I appreciate the opportunity to care for this patient. CC: Drema Pry, DO and Dr. Ardis Hughs.

## 2014-03-22 ENCOUNTER — Encounter: Payer: Self-pay | Admitting: Gastroenterology

## 2014-04-05 ENCOUNTER — Ambulatory Visit: Payer: BC Managed Care – PPO | Admitting: Internal Medicine

## 2014-04-09 ENCOUNTER — Ambulatory Visit (INDEPENDENT_AMBULATORY_CARE_PROVIDER_SITE_OTHER): Payer: BC Managed Care – PPO | Admitting: Internal Medicine

## 2014-04-09 ENCOUNTER — Encounter: Payer: Self-pay | Admitting: Internal Medicine

## 2014-04-09 VITALS — BP 90/60 | HR 80 | Ht 67.0 in | Wt 148.0 lb

## 2014-04-09 DIAGNOSIS — K602 Anal fissure, unspecified: Secondary | ICD-10-CM

## 2014-04-09 DIAGNOSIS — L290 Pruritus ani: Secondary | ICD-10-CM | POA: Insufficient documentation

## 2014-04-09 MED ORDER — DILTIAZEM GEL 2 %: 1.0000 "application " | Freq: Two times a day (BID) | CUTANEOUS | Status: DC

## 2014-04-09 NOTE — Progress Notes (Signed)
         Subjective:    Patient ID: Elizabeth Douglas, female    DOB: [birth year], 63 y.o.   MRN: [id number]  HPI Here for f/u posterior anal fissure dx 2 months ago. Better on diltiazem gel and Benefiber. Having some anal itching now, no sig pain issues. Itching x 1-2 weeks.  Medications, allergies, past medical history, past surgical history, family history and social history are reviewed and updated in the EMR.  Review of Systems As above    Objective:   Physical Exam WDWN NAD  Rectal exam with [name], CMA present  Anoderm inspection revealed small posterior fissure and mild perianal erythema, there was a small anterior tag Digital exam revealed normal resting tone, brown stool, no mass..     Assessment & Plan:  Anal fissure - posterior Improving with diltiazem gel by hx and exam She will continue Benefiber and Diltizem gel and see me in 2 months Balneol for anal itching   Anal itching Try Balneol If not helpful then Recticare or Calmoseptine   RTC 2 months

## 2014-04-09 NOTE — Assessment & Plan Note (Signed)
Improving with diltiazem gel by hx and exam She will continue Benefiber and Diltizem gel and see me in 2 months Balneol for anal itching

## 2014-04-09 NOTE — Patient Instructions (Addendum)
Try balneol, handout provided today.  If this doesn't help try Recticare or calmoseptine.  All of these things are over the counter.  We have sent the following medications to your [place] for you to pick up at your convenience: dilitazem gel  Follow up with [place] in 2 months.   I appreciate the opportunity to care for you.

## 2014-04-09 NOTE — Assessment & Plan Note (Signed)
Try Balneol If not helpful then Recticare or Calmoseptine

## 2014-05-31 ENCOUNTER — Other Ambulatory Visit: Payer: Self-pay | Admitting: Internal Medicine

## 2014-06-08 ENCOUNTER — Other Ambulatory Visit: Payer: Self-pay | Admitting: Gastroenterology

## 2014-08-08 ENCOUNTER — Ambulatory Visit (INDEPENDENT_AMBULATORY_CARE_PROVIDER_SITE_OTHER): Payer: BC Managed Care – PPO | Admitting: Internal Medicine

## 2014-08-08 ENCOUNTER — Telehealth: Payer: Self-pay | Admitting: *Deleted

## 2014-08-08 ENCOUNTER — Encounter: Payer: Self-pay | Admitting: Internal Medicine

## 2014-08-08 VITALS — BP 102/78 | HR 84 | Ht 67.0 in | Wt 154.2 lb

## 2014-08-08 DIAGNOSIS — K602 Anal fissure, unspecified: Secondary | ICD-10-CM

## 2014-08-08 MED ORDER — DILTIAZEM GEL 2 %: CUTANEOUS | Status: DC

## 2014-08-08 NOTE — Telephone Encounter (Signed)
Received fax from [facility], they can not fill Diltiazem 2 % gel, cannot compound. Called patient to get the okay to fill at [facility]. LMOM for patient to call back.

## 2014-08-08 NOTE — Progress Notes (Signed)
   Subjective:    Patient ID: Elizabeth Douglas, female    DOB: [birth year], 63 y.o.   MRN: [id number]  HPI The patient is here for followup of an anal fissure. This was diagnosed in March, she was seen in followup in May. She's been on diltiazem gel and overall is much better. She did have a problem in June after a car ride with a flare of pain. Overall she is satisfied that she is improved with only occasional difficulty. She had to titrate down the Benefiber ago stools became looser with vegetable intake in the summer.   Medications, allergies, past medical history, past surgical history, family history and social history are reviewed and updated in the EMR.  Review of Systems As above    Objective:   Physical Exam With female staff present, posterior sentinel pile is gone there is right anterior tag tiny, digital rectal exam is nontender without stenosis or mass.       Assessment & Plan:  Anal fissure - posterior Improved using Benefiber and diltiazem gel. She seems to be about 80% improved if not more. She will continue the diltiazem gel, I have suggested she use it for a month past persistent symptom relief. She will return as needed. She is aware other options including Botox injection and surgery are available but we do not think those are necessary at this time.

## 2014-08-08 NOTE — Telephone Encounter (Signed)
Patient called back and stated RX can be sent to [place], that is close to her. RX sent

## 2014-08-08 NOTE — Patient Instructions (Signed)
We have sent  medications to your pharmacy for you to pick up at your convenience. Follow up as needed. CC:  Alyson Ingles MD

## 2014-08-08 NOTE — Assessment & Plan Note (Addendum)
Improved using Benefiber and diltiazem gel. She seems to be about 80% improved if not more. She will continue the diltiazem gel, I have suggested she use it for a month past persistent symptom relief. She will return as needed. She is aware other options including Botox injection and surgery are available but we do not think those are necessary at this time.

## 2014-08-30 ENCOUNTER — Ambulatory Visit (INDEPENDENT_AMBULATORY_CARE_PROVIDER_SITE_OTHER): Payer: BC Managed Care – PPO | Admitting: *Deleted

## 2014-08-30 DIAGNOSIS — Z23 Encounter for immunization: Secondary | ICD-10-CM

## 2014-09-06 ENCOUNTER — Telehealth: Payer: Self-pay | Admitting: Internal Medicine

## 2014-09-06 NOTE — Telephone Encounter (Signed)
Pt would like to add b12 , vit d and also varicella titer to cpx labs on 11/09/15. Can I sch?

## 2014-09-07 NOTE — Telephone Encounter (Signed)
Pt is aware additional lab has been order

## 2014-09-07 NOTE — Telephone Encounter (Signed)
Ok to add

## 2014-09-14 ENCOUNTER — Other Ambulatory Visit: Payer: Self-pay

## 2014-10-15 ENCOUNTER — Telehealth: Payer: Self-pay | Admitting: Gastroenterology

## 2014-10-15 NOTE — Telephone Encounter (Signed)
10/16/14 245 pm Elizabeth Douglas appt sch, Left message on machine to call back regarding appt, she has not been advised of appt

## 2014-10-15 NOTE — Telephone Encounter (Signed)
Pt requested to see Dr Carlean Purl because he wanted to see the problem she has been having, he had one spot for 10/19/14 she is aware and has been added to the sched with gessner at 830 10/19/14

## 2014-10-16 ENCOUNTER — Ambulatory Visit: Payer: BC Managed Care – PPO | Admitting: Physician Assistant

## 2014-10-19 ENCOUNTER — Encounter: Payer: Self-pay | Admitting: Internal Medicine

## 2014-10-19 ENCOUNTER — Ambulatory Visit (INDEPENDENT_AMBULATORY_CARE_PROVIDER_SITE_OTHER): Payer: BC Managed Care – PPO | Admitting: Internal Medicine

## 2014-10-19 VITALS — BP 124/68 | HR 76 | Ht 67.0 in | Wt 154.0 lb

## 2014-10-19 DIAGNOSIS — K602 Anal fissure, unspecified: Secondary | ICD-10-CM

## 2014-10-19 DIAGNOSIS — K645 Perianal venous thrombosis: Secondary | ICD-10-CM | POA: Insufficient documentation

## 2014-10-19 NOTE — Patient Instructions (Signed)
Please increase your benefiber to 2 tablespoons daily.  Use a stool softner as needed.  Follow up with Dr. Carlean Purl as needed.    I appreciate the opportunity to care for you.

## 2014-10-19 NOTE — Assessment & Plan Note (Signed)
Increase benefiber to 2 tbsp  Add stool softener prn RTC prn

## 2014-10-19 NOTE — Assessment & Plan Note (Addendum)
Better overall. i think she had a slightly thrombosed external hemorrhoid. Increase benefiber to 2 tbsp daily

## 2014-10-19 NOTE — Progress Notes (Signed)
   Subjective:    Patient ID: Elizabeth Douglas, female    DOB: 11-28-1951, 63 y.o.   MRN: [id number]  HPI Had slight anal pain few dasy ago x 1-2 days Saw "blood blister" at anus No bleeding All ok now Has slightly hard stool at start of defecation but qd BM On Benefiber 1 tbsp qd  Medications, allergies, past medical history, past surgical history, family history and social history are reviewed and updated in the EMR.  Review of Systems As above    Objective:   Physical Exam WDWN NAD Elizabeth Douglas, Deuel present. Anal exam shows slight purple streak posterior canal. 2 small tags No tendnerness, no mass       Assessment & Plan:  Anal fissure - posterior Better overall. i think she had a slightly thrombosed external hemorrhoid. Increase benefiber to 2 tbsp daily   Thrombosed external hemorrhoid Increase benefiber to 2 tbsp  Add stool softener prn RTC prn

## 2014-11-02 ENCOUNTER — Other Ambulatory Visit: Payer: Self-pay | Admitting: Gastroenterology

## 2014-11-08 ENCOUNTER — Other Ambulatory Visit (INDEPENDENT_AMBULATORY_CARE_PROVIDER_SITE_OTHER): Payer: BC Managed Care – PPO

## 2014-11-08 DIAGNOSIS — E538 Deficiency of other specified B group vitamins: Secondary | ICD-10-CM

## 2014-11-08 DIAGNOSIS — Z0000 Encounter for general adult medical examination without abnormal findings: Secondary | ICD-10-CM

## 2014-11-08 DIAGNOSIS — E559 Vitamin D deficiency, unspecified: Secondary | ICD-10-CM

## 2014-11-08 DIAGNOSIS — Z20820 Contact with and (suspected) exposure to varicella: Secondary | ICD-10-CM

## 2014-11-08 LAB — CBC WITH DIFFERENTIAL/PLATELET
BASOS PCT: 0.4 % (ref 0.0–3.0)
Basophils Absolute: 0 10*3/uL (ref 0.0–0.1)
Eosinophils Absolute: 0.1 10*3/uL (ref 0.0–0.7)
Eosinophils Relative: 1.8 % (ref 0.0–5.0)
HCT: 42.9 % (ref 36.0–46.0)
Hemoglobin: 14.4 g/dL (ref 12.0–15.0)
LYMPHS PCT: 43.7 % (ref 12.0–46.0)
Lymphs Abs: 1.8 10*3/uL (ref 0.7–4.0)
MCHC: 33.7 g/dL (ref 30.0–36.0)
MCV: 95.5 fl (ref 78.0–100.0)
Monocytes Absolute: 0.3 10*3/uL (ref 0.1–1.0)
Monocytes Relative: 6 % (ref 3.0–12.0)
NEUTROS ABS: 2 10*3/uL (ref 1.4–7.7)
Neutrophils Relative %: 48.1 % (ref 43.0–77.0)
Platelets: 160 10*3/uL (ref 150.0–400.0)
RBC: 4.49 Mil/uL (ref 3.87–5.11)
RDW: 13 % (ref 11.5–15.5)
WBC: 4.2 10*3/uL (ref 4.0–10.5)

## 2014-11-08 LAB — POCT URINALYSIS DIPSTICK
BILIRUBIN UA: NEGATIVE
Blood, UA: NEGATIVE
GLUCOSE UA: NEGATIVE
KETONES UA: NEGATIVE
LEUKOCYTES UA: NEGATIVE
Nitrite, UA: NEGATIVE
PH UA: 6.5
Protein, UA: NEGATIVE
Spec Grav, UA: 1.01
Urobilinogen, UA: 0.2

## 2014-11-08 LAB — LIPID PANEL
Cholesterol: 181 mg/dL (ref 0–200)
HDL: 58.8 mg/dL (ref >39.00)
LDL CALC: 112 mg/dL — AB (ref 0–99)
NonHDL: 122.2
TRIGLYCERIDES: 49 mg/dL (ref 0.0–149.0)
Total CHOL/HDL Ratio: 3
VLDL: 9.8 mg/dL (ref 0.0–40.0)

## 2014-11-08 LAB — BASIC METABOLIC PANEL
BUN: 12 mg/dL (ref 6–23)
CHLORIDE: 104 meq/L (ref 96–112)
CO2: 28 mEq/L (ref 19–32)
Calcium: 9 mg/dL (ref 8.4–10.5)
Creatinine, Ser: 0.7 mg/dL (ref 0.4–1.2)
GFR: 93.36 mL/min (ref >60.00)
Glucose, Bld: 77 mg/dL (ref 70–99)
Potassium: 4.2 mEq/L (ref 3.5–5.1)
Sodium: 139 mEq/L (ref 135–145)

## 2014-11-08 LAB — HEPATIC FUNCTION PANEL
ALT: 12 U/L (ref 0–35)
AST: 17 U/L (ref 0–37)
Albumin: 4.1 g/dL (ref 3.5–5.2)
Alkaline Phosphatase: 42 U/L (ref 39–117)
Bilirubin, Direct: 0.1 mg/dL (ref 0.0–0.3)
TOTAL PROTEIN: 6.8 g/dL (ref 6.0–8.3)
Total Bilirubin: 0.9 mg/dL (ref 0.2–1.2)

## 2014-11-08 LAB — VITAMIN B12: VITAMIN B 12: 448 pg/mL (ref 211–911)

## 2014-11-08 LAB — TSH: TSH: 2.31 u[IU]/mL (ref 0.35–4.50)

## 2014-11-08 NOTE — Addendum Note (Signed)
Addended by: Townsend Roger D on: 11/08/2014 08:21 AM   Modules accepted: Orders

## 2014-11-09 LAB — VARICELLA ZOSTER ANTIBODY, IGM: Varicella Zoster Ab IgM: 0.15 {ISR} (ref <0.91)

## 2014-11-09 LAB — VARICELLA ZOSTER ANTIBODY, IGG: Varicella IgG: 562.3 Index — ABNORMAL HIGH (ref <135.00)

## 2014-11-10 LAB — VITAMIN D 1,25 DIHYDROXY
VITAMIN D3 1, 25 (OH): 34 pg/mL
Vitamin D 1, 25 (OH)2 Total: 34 pg/mL (ref 18–72)

## 2014-11-13 ENCOUNTER — Other Ambulatory Visit: Payer: Self-pay | Admitting: Gastroenterology

## 2014-11-14 ENCOUNTER — Ambulatory Visit (INDEPENDENT_AMBULATORY_CARE_PROVIDER_SITE_OTHER): Payer: BC Managed Care – PPO | Admitting: Internal Medicine

## 2014-11-14 ENCOUNTER — Encounter: Payer: Self-pay | Admitting: Internal Medicine

## 2014-11-14 VITALS — BP 116/80 | HR 81 | Temp 98.2°F | Ht 67.0 in | Wt 154.0 lb

## 2014-11-14 DIAGNOSIS — Z23 Encounter for immunization: Secondary | ICD-10-CM

## 2014-11-14 DIAGNOSIS — Z0000 Encounter for general adult medical examination without abnormal findings: Secondary | ICD-10-CM | POA: Insufficient documentation

## 2014-11-14 MED ORDER — OMEPRAZOLE 20 MG PO CPDR: 20.0000 mg | DELAYED_RELEASE_CAPSULE | Freq: Two times a day (BID) | ORAL | Status: DC

## 2014-11-14 NOTE — Assessment & Plan Note (Addendum)
Reviewed adult health maintenance protocols.  Patient updated with shingles vaccine. Patient date with colonoscopy. She is followed by her gynecologist for Pap, pelvic and breast exam.  Her DEXA shows osteopenia in 11/2013.  Continue calcium, vitamin D, and weight bearing exercises.

## 2014-11-14 NOTE — Progress Notes (Signed)
Subjective:    Patient ID: Elizabeth Douglas, female    DOB: February 24, 1951, 63 y.o.   MRN: [id number]  HPI  63 year old white female presents for routine physical. Endoluminal history-patient followed by a gastroenterologist for anal fissure. Patient reports pain near the end of her coccyx with prolonged sitting. Patient will also has been experiencing intermittent low back pain. Her symptoms worse with stretching exercises.  Screening blood work reviewed in detail.  Patient takes 2000 units of vitamin D daily.  Patient followed by dermatologist for full skin exams. She has history of skin cancer. (Basal cell carcinoma)  Patient reports, pelvic and breast exam performed by her GYN.  Review of Systems  Constitutional: Negative for activity change, appetite change and unexpected weight change.  Eyes: Negative for visual disturbance.  Respiratory: Negative for cough, chest tightness and shortness of breath.  Cardiovascular: Negative for chest pain.  Genitourinary: Negative for difficulty urinating.  Neurological: Negative for headaches.  Gastrointestinal: Negative for abdominal pain, heartburn melena or hematochezia Psych: Negative for depression or anxiety Endo:  No polyuria or polydypsia        Past Medical History  Diagnosis Date  . Excess or deficiency of vitamin D   . Basal cell carcinoma   . Microhematuria   . GERD (gastroesophageal reflux disease)   . Osteopenia   . Atypical chest pain   . Thrombocytopenia   . Gallbladder polyp   . Anal fissure     History   Social History  . Marital Status: Married    Spouse Name: N/A    Number of Children: 1  . Years of Education: N/A   Occupational History  . Retired    Social History Main Topics  . Smoking status: Never Smoker   . Smokeless tobacco: Never Used  . Alcohol Use: No  . Drug Use: No  . Sexual Activity: Not on file   Other Topics Concern  . Not on file   Social History Narrative    Past Surgical History    Procedure Laterality Date  . Lung biopsy    . Colonoscopy  2005, 2015  . Upper gastrointestinal endoscopy  2009    Family History  Problem Relation Age of Onset  . COPD Mother   . Osteoporosis Mother   . Prostate cancer Father   . Heart disease Brother   . Lymphoma Brother     non and hodgkins  . Coronary artery disease Brother   . Colon cancer Neg Hx   . Colon polyps Neg Hx   . Rectal cancer Neg Hx   . Stomach cancer Neg Hx     No Known Allergies  Current Outpatient Prescriptions on File Prior to Visit  Medication Sig Dispense Refill  . Cholecalciferol (VITAMIN D) 2000 UNITS CAPS Take 1 capsule by mouth daily.    Marland Kitchen diltiazem 2 % GEL Apply 1 application topically 2 (two) times daily. Insert pea-sized amount into rectum. 30 g 2  . estradiol (ESTRACE VAGINAL) 0.1 MG/GM vaginal cream Place vaginally as directed.      Marland Kitchen omeprazole (PRILOSEC) 20 MG capsule TAKE ONE CAPSULE BY MOUTH TWICE DAILY 180 capsule 0  . tretinoin (RETIN-A) 0.05 % cream Apply 1 application topically 4 (four) times a week.     No current facility-administered medications on file prior to visit.    BP 116/80 mmHg  Pulse 81  Temp(Src) 98.2 F (36.8 C) (Oral)  Ht 5\' 7"  (1.702 m)  Wt 154 lb (69.854 kg)  BMI 24.11 kg/m2    Objective:   Physical Exam  Constitutional: She is oriented to person, place, and time. She appears well-developed and well-nourished. No distress.  HENT:  Head: Normocephalic and atraumatic.  Right Ear: External ear normal.  Left Ear: External ear normal.  Mouth/Throat: Oropharynx is clear and moist.  Eyes: Conjunctivae and EOM are normal. Pupils are equal, round, and reactive to light. No scleral icterus.  Neck: Neck supple.  Cardiovascular: Normal rate, regular rhythm and normal heart sounds.   No murmur heard. Pulmonary/Chest: Effort normal and breath sounds normal. She has no wheezes.  Abdominal: Bowel sounds are normal. There is no tenderness.  Musculoskeletal: She  exhibits no edema.  Lymphadenopathy:    She has no cervical adenopathy.  Neurological: She is alert and oriented to person, place, and time. No cranial nerve deficit.  Skin: Skin is warm and dry.  Psychiatric: She has a normal mood and affect. Her behavior is normal.          Assessment & Plan:

## 2014-11-14 NOTE — Patient Instructions (Signed)
Please complete the following lab tests before your next follow up appointment: CPX labs before next visit including vitamin D level - use osteopenia code

## 2014-11-14 NOTE — Progress Notes (Signed)
Pre visit review using our clinic review tool, if applicable. No additional management support is needed unless otherwise documented below in the visit note. 

## 2014-11-15 ENCOUNTER — Telehealth: Payer: Self-pay | Admitting: *Deleted

## 2014-11-15 ENCOUNTER — Encounter: Payer: BC Managed Care – PPO | Admitting: Internal Medicine

## 2014-11-15 NOTE — Telephone Encounter (Signed)
-----   Message from [place], DO sent at 11/14/2014  8:42 PM EST ----- Call pt - she should return when she can to Tdap.  Her last tetanus shot was in 2005.

## 2014-11-15 NOTE — Telephone Encounter (Signed)
appt scheduled for 12/19/14 at11

## 2014-12-07 ENCOUNTER — Telehealth: Payer: Self-pay | Admitting: Gastroenterology

## 2014-12-07 NOTE — Telephone Encounter (Signed)
Pt has been scheduled with Dr Carlean Purl due to hemorrhoid issues and was told to see Dr Carlean Purl for hemorrhoid problems

## 2014-12-10 ENCOUNTER — Encounter: Payer: Self-pay | Admitting: Internal Medicine

## 2014-12-10 ENCOUNTER — Ambulatory Visit (INDEPENDENT_AMBULATORY_CARE_PROVIDER_SITE_OTHER): Payer: BLUE CROSS/BLUE SHIELD | Admitting: Internal Medicine

## 2014-12-10 VITALS — BP 100/68 | HR 72 | Ht 67.0 in | Wt 155.0 lb

## 2014-12-10 DIAGNOSIS — K648 Other hemorrhoids: Secondary | ICD-10-CM

## 2014-12-10 DIAGNOSIS — K6289 Other specified diseases of anus and rectum: Secondary | ICD-10-CM

## 2014-12-10 DIAGNOSIS — K602 Anal fissure, unspecified: Secondary | ICD-10-CM

## 2014-12-10 DIAGNOSIS — L290 Pruritus ani: Secondary | ICD-10-CM

## 2014-12-10 DIAGNOSIS — K649 Unspecified hemorrhoids: Secondary | ICD-10-CM | POA: Insufficient documentation

## 2014-12-10 NOTE — Progress Notes (Signed)
   Subjective:    Patient ID: Elizabeth Douglas, female    DOB: 12/03/1950, 64 y.o.   MRN: [id number]  HPI Patient is here for follow-up. She has had rectal pain and bleeding attributable to fissure and some component of hemorrhoids. She has been treated with diltiazem gel. She did improve but now she is having pain again. Rectal pain with sitting. Slight with defecation. She cannot get comfortable. Stools are easy to pass and not hard. Stopped diltiazem gel about 2 weeks ago to see if that woud change things. No Known Allergies Outpatient Prescriptions Prior to Visit  Medication Sig Dispense Refill  . Cholecalciferol (VITAMIN D) 2000 UNITS CAPS Take 1 capsule by mouth daily.    Marland Kitchen diltiazem 2 % GEL Apply 1 application topically 2 (two) times daily. Insert pea-sized amount into rectum. 30 g 2  . estradiol (ESTRACE VAGINAL) 0.1 MG/GM vaginal cream Place vaginally as directed.      Marland Kitchen omeprazole (PRILOSEC) 20 MG capsule Take 1 capsule (20 mg total) by mouth 2 (two) times daily. 180 capsule 3  . tretinoin (RETIN-A) 0.05 % cream Apply 1 application topically 4 (four) times a week.     No facility-administered medications prior to visit.   Past Medical History  Diagnosis Date  . Excess or deficiency of vitamin D   . Basal cell carcinoma   . Microhematuria   . GERD (gastroesophageal reflux disease)   . Osteopenia   . Atypical chest pain   . Thrombocytopenia   . Gallbladder polyp   . Anal fissure   . Hemorrhoids    Past Surgical History  Procedure Laterality Date  . Lung biopsy    . Colonoscopy  2005, 2015  . Upper gastrointestinal endoscopy  2009     Review of Systems As above    Objective:   Physical Exam Pleasant well-developed middle-aged white woman in no acute distress With female staff present inspection of the anal area shows a small right anterior tag. She says this area itches a lot. The anoderm otherwise looks normal. Digital rectal exam is normal and nontender the  coccyx is not tender.  Anoscopy is performed demonstrating inflamed external hemorrhoids I believe, some component of grade 1 internal hemorrhoids as well. Os teary her on the left there does appear to be some more erythematous and violet colored discoloration swelling of the distal hemorrhoid. None of this is tender or sensitive. I cannot palpate or see a fissure.       Assessment & Plan:   1. hemorrhoids   2. Rectal pain   3. Anal itching    I'm not sure what causing her problems but at this point I think may be its external hemorrhoids. I don't think she has a fissure at this point.  I will have her try some rectum care on an as-needed basis and refer her to Dr. Marcello Moores of colorectal surgery for further evaluation and treatment.  Cc: Oretha Caprice, MD and Leighton Ruff, MD

## 2014-12-10 NOTE — Patient Instructions (Signed)
You have been scheduled for an appointment with Dr. Leighton Ruff at [facility]. Your appointment is on 12/25/14 at 9:00am. Please arrive at 8:30am for registration. Make certain to bring a list of current medications, including any over the counter medications or vitamins. Also bring your co-pay if you have one as well as your insurance cards. [facility] is located at [address]. Should you need to reschedule your appointment, please contact them at [phone].   Please try RectiCare as needed.  It is over the counter.   I appreciate the opportunity to care for you. Silvano Rusk, M.D., [facility]

## 2014-12-19 ENCOUNTER — Ambulatory Visit: Payer: BC Managed Care – PPO | Admitting: *Deleted

## 2015-06-26 ENCOUNTER — Ambulatory Visit (INDEPENDENT_AMBULATORY_CARE_PROVIDER_SITE_OTHER): Payer: BLUE CROSS/BLUE SHIELD | Admitting: Internal Medicine

## 2015-06-26 ENCOUNTER — Encounter: Payer: Self-pay | Admitting: Internal Medicine

## 2015-06-26 VITALS — BP 108/70 | HR 82 | Temp 98.7°F | Resp 16 | Ht 67.0 in | Wt 153.5 lb

## 2015-06-26 DIAGNOSIS — N390 Urinary tract infection, site not specified: Secondary | ICD-10-CM | POA: Diagnosis not present

## 2015-06-26 DIAGNOSIS — R319 Hematuria, unspecified: Secondary | ICD-10-CM

## 2015-06-26 DIAGNOSIS — R300 Dysuria: Secondary | ICD-10-CM

## 2015-06-26 LAB — POCT URINALYSIS DIPSTICK
Bilirubin, UA: NEGATIVE
Glucose, UA: NEGATIVE
KETONES UA: NEGATIVE
Nitrite, UA: POSITIVE
Spec Grav, UA: 1.015
UROBILINOGEN UA: 0.2
pH, UA: 6

## 2015-06-26 MED ORDER — CEFUROXIME AXETIL 250 MG PO TABS: 250.0000 mg | ORAL_TABLET | Freq: Two times a day (BID) | ORAL | Status: DC

## 2015-06-26 NOTE — Progress Notes (Signed)
Subjective:    Patient ID: Elizabeth Douglas, female    DOB: Mar 29, 1951, 64 y.o.   MRN: [id number]  HPI  64 year old white female complains of urinary urgency, frequency and dysuria for 4-5 days. She does not have a previous history of recurrent UTI.  Patient denies fever or chills.  She denies back pain.   Review of Systems Negative for flank pain,  No prior history of kidney stones    Past Medical History  Diagnosis Date  . Excess or deficiency of vitamin D   . Basal cell carcinoma   . Microhematuria   . GERD (gastroesophageal reflux disease)   . Osteopenia   . Atypical chest pain   . Thrombocytopenia   . Gallbladder polyp   . Anal fissure   . Hemorrhoids     History   Social History  . Marital Status: Married    Spouse Name: N/A  . Number of Children: 1  . Years of Education: N/A   Occupational History  . Retired    Social History Main Topics  . Smoking status: Never Smoker   . Smokeless tobacco: Never Used  . Alcohol Use: No  . Drug Use: No  . Sexual Activity: Not on file   Other Topics Concern  . Not on file   Social History Narrative    Past Surgical History  Procedure Laterality Date  . Lung biopsy    . Colonoscopy  2005, 2015  . Upper gastrointestinal endoscopy  2009    Family History  Problem Relation Age of Onset  . COPD Mother   . Osteoporosis Mother   . Prostate cancer Father   . Heart disease Brother   . Lymphoma Brother     non and hodgkins  . Coronary artery disease Brother   . Colon cancer Neg Hx   . Colon polyps Neg Hx   . Rectal cancer Neg Hx   . Stomach cancer Neg Hx     No Known Allergies  Current Outpatient Prescriptions on File Prior to Visit  Medication Sig Dispense Refill  . Cholecalciferol (VITAMIN D) 2000 UNITS CAPS Take 1 capsule by mouth daily.    Marland Kitchen diltiazem 2 % GEL Apply 1 application topically 2 (two) times daily. Insert pea-sized amount into rectum. 30 g 2  . estradiol (ESTRACE VAGINAL) 0.1 MG/GM vaginal  cream Place vaginally as directed.      Marland Kitchen omeprazole (PRILOSEC) 20 MG capsule Take 1 capsule (20 mg total) by mouth 2 (two) times daily. 180 capsule 3  . tretinoin (RETIN-A) 0.05 % cream Apply 1 application topically 4 (four) times a week.     No current facility-administered medications on file prior to visit.    BP 108/70 mmHg  Pulse 82  Temp(Src) 98.7 F (37.1 C) (Oral)  Resp 16  Ht 5\' 7"  (1.702 m)  Wt 153 lb 8 oz (69.627 kg)  BMI 24.04 kg/m2    Objective:   Physical Exam  Constitutional: She is oriented to person, place, and time. She appears well-developed and well-nourished.  Cardiovascular: Normal rate, regular rhythm and normal heart sounds.   No murmur heard. Pulmonary/Chest: Effort normal and breath sounds normal. She has no wheezes.  Abdominal: Soft. Bowel sounds are normal.  Mild suprapubic tenderness, no flank tenderness  Neurological: She is alert and oriented to person, place, and time. No cranial nerve deficit.  Skin: Skin is warm and dry.  Psychiatric: She has a normal mood and affect. Her behavior is normal.  Assessment & Plan:   1.  Urinary tract infection uncomplicated  Plan  Treat with cefuroxime 250 mg twice daily for 5 days, increase hydration, and send urine culture.  Patient advised to call office if symptoms persist or worsen.

## 2015-06-26 NOTE — Patient Instructions (Signed)
Drink plenty of fluids and finish your antibiotics as directed Please contact our office if your symptoms do not improve or gets worse.

## 2015-06-29 LAB — URINE CULTURE: Colony Count: >=100000

## 2015-07-01 ENCOUNTER — Telehealth: Payer: Self-pay | Admitting: Internal Medicine

## 2015-07-01 DIAGNOSIS — R1030 Lower abdominal pain, unspecified: Secondary | ICD-10-CM

## 2015-07-01 DIAGNOSIS — M545 Low back pain: Secondary | ICD-10-CM

## 2015-07-01 NOTE — Telephone Encounter (Signed)
Results still pending.  Please check tomorrow.

## 2015-07-01 NOTE — Telephone Encounter (Signed)
Pt would like results of urine culture done last week

## 2015-07-03 NOTE — Telephone Encounter (Signed)
Elizabeth Douglas is trying to get a final result

## 2015-07-03 NOTE — Telephone Encounter (Signed)
Place order for CT of abd and pelvis w/o contrast regarding UTI, back and groin pain.  Rule out kidney stone

## 2015-07-03 NOTE — Telephone Encounter (Signed)
Patient is aware of lab result.  She does not have dysuria.  She states that she is still having lower back pain with lower hip/groin area pain.

## 2015-07-03 NOTE — Telephone Encounter (Signed)
Left message on machine for patient to return our call 

## 2015-07-04 ENCOUNTER — Inpatient Hospital Stay: Admission: RE | Admit: 2015-07-04 | Payer: BLUE CROSS/BLUE SHIELD | Source: Ambulatory Visit

## 2015-07-04 ENCOUNTER — Telehealth: Payer: Self-pay | Admitting: Internal Medicine

## 2015-07-04 ENCOUNTER — Encounter: Payer: Self-pay | Admitting: Internal Medicine

## 2015-07-04 NOTE — Telephone Encounter (Signed)
Patient is aware and order placed 

## 2015-07-04 NOTE — Telephone Encounter (Signed)
fyi Per stacey at [facility] the  Patient said she  doesn't think she wants to have this procedure done she thinks its her back she will talk to MD about this , if she needs it she will call back to schedule

## 2015-07-05 NOTE — Telephone Encounter (Signed)
Ok

## 2015-07-09 ENCOUNTER — Ambulatory Visit (INDEPENDENT_AMBULATORY_CARE_PROVIDER_SITE_OTHER)
Admission: RE | Admit: 2015-07-09 | Discharge: 2015-07-09 | Disposition: A | Discharge status: Home / Self Care | Payer: BLUE CROSS/BLUE SHIELD | Source: Ambulatory Visit | Attending: Internal Medicine | Admitting: Internal Medicine

## 2015-07-09 DIAGNOSIS — M545 Low back pain: Secondary | ICD-10-CM | POA: Diagnosis not present

## 2015-07-09 DIAGNOSIS — R1030 Lower abdominal pain, unspecified: Secondary | ICD-10-CM | POA: Diagnosis not present

## 2015-07-10 ENCOUNTER — Other Ambulatory Visit (INDEPENDENT_AMBULATORY_CARE_PROVIDER_SITE_OTHER): Payer: BLUE CROSS/BLUE SHIELD

## 2015-07-10 DIAGNOSIS — R300 Dysuria: Secondary | ICD-10-CM | POA: Diagnosis not present

## 2015-07-10 LAB — POCT URINALYSIS DIPSTICK
Bilirubin, UA: NEGATIVE
Glucose, UA: NEGATIVE
Ketones, UA: NEGATIVE
LEUKOCYTES UA: NEGATIVE
Nitrite, UA: NEGATIVE
PH UA: 6.5
PROTEIN UA: NEGATIVE
Spec Grav, UA: 1.01
Urobilinogen, UA: 0.2

## 2015-07-11 ENCOUNTER — Encounter: Payer: Self-pay | Admitting: Internal Medicine

## 2015-07-16 ENCOUNTER — Encounter: Payer: Self-pay | Admitting: Internal Medicine

## 2015-08-21 ENCOUNTER — Encounter: Payer: Self-pay | Admitting: Gastroenterology

## 2015-09-04 ENCOUNTER — Ambulatory Visit (INDEPENDENT_AMBULATORY_CARE_PROVIDER_SITE_OTHER): Payer: BLUE CROSS/BLUE SHIELD | Admitting: *Deleted

## 2015-09-04 DIAGNOSIS — Z23 Encounter for immunization: Secondary | ICD-10-CM | POA: Diagnosis not present

## 2015-09-19 ENCOUNTER — Ambulatory Visit (INDEPENDENT_AMBULATORY_CARE_PROVIDER_SITE_OTHER): Payer: BLUE CROSS/BLUE SHIELD | Admitting: Family Medicine

## 2015-09-19 ENCOUNTER — Ambulatory Visit (INDEPENDENT_AMBULATORY_CARE_PROVIDER_SITE_OTHER)
Admission: RE | Admit: 2015-09-19 | Discharge: 2015-09-19 | Disposition: A | Discharge status: Home / Self Care | Payer: BLUE CROSS/BLUE SHIELD | Source: Ambulatory Visit | Attending: Family Medicine | Admitting: Family Medicine

## 2015-09-19 VITALS — BP 110/70 | HR 70 | Temp 99.0°F | Wt 156.0 lb

## 2015-09-19 DIAGNOSIS — R600 Localized edema: Secondary | ICD-10-CM | POA: Diagnosis not present

## 2015-09-19 DIAGNOSIS — B029 Zoster without complications: Secondary | ICD-10-CM | POA: Diagnosis not present

## 2015-09-19 DIAGNOSIS — M25552 Pain in left hip: Secondary | ICD-10-CM

## 2015-09-19 LAB — HEPATIC FUNCTION PANEL
ALBUMIN: 4.2 g/dL (ref 3.5–5.2)
ALK PHOS: 51 U/L (ref 39–117)
ALT: 10 U/L (ref 0–35)
AST: 16 U/L (ref 0–37)
Bilirubin, Direct: 0.1 mg/dL (ref 0.0–0.3)
Total Bilirubin: 0.4 mg/dL (ref 0.2–1.2)
Total Protein: 6.8 g/dL (ref 6.0–8.3)

## 2015-09-19 LAB — BASIC METABOLIC PANEL
BUN: 12 mg/dL (ref 6–23)
CHLORIDE: 108 meq/L (ref 96–112)
CO2: 33 mEq/L — ABNORMAL HIGH (ref 19–32)
Calcium: 9.3 mg/dL (ref 8.4–10.5)
Creatinine, Ser: 0.6 mg/dL (ref 0.40–1.20)
GFR: 107.56 mL/min (ref >60.00)
GLUCOSE: 88 mg/dL (ref 70–99)
POTASSIUM: 4.7 meq/L (ref 3.5–5.1)
Sodium: 147 mEq/L — ABNORMAL HIGH (ref 135–145)

## 2015-09-19 LAB — POCT URINALYSIS DIPSTICK
BILIRUBIN UA: NEGATIVE
Glucose, UA: NEGATIVE
KETONES UA: NEGATIVE
Leukocytes, UA: NEGATIVE
Nitrite, UA: NEGATIVE
Protein, UA: NEGATIVE
Spec Grav, UA: 1.015
Urobilinogen, UA: 0.2
pH, UA: 7

## 2015-09-19 NOTE — Progress Notes (Signed)
Pre visit review using our clinic review tool, if applicable. No additional management support is needed unless otherwise documented below in the visit note. 

## 2015-09-19 NOTE — Progress Notes (Signed)
Subjective:    Patient ID: Elizabeth Douglas, female    DOB: [birth year], 64 y.o.   MRN: [id number]  HPI Seen for 3 separate items as follows  2 week history bilateral leg edema. Edema relatively mild. Worse late in the day. No recent dyspnea. No orthopnea. No recent travels. No recent dietary changes. Denies any fatigue or arthralgias. She takes no medications other than omeprazole and estrogen cream which is not new. Her weight is up about 3 pounds from baseline. No recent nonsteroidal use.  Somewhat pruritic and slightly burning rash right breast just superior to the areola. First noted about 5 days ago. No progressive rash. Prior shingles vaccine.  Almost 1 year history of somewhat poorly localized pain around her left hip. Sometimes radiates into the groin region. Not consistently with ambulation. Sometimes left lower lumbar but denies any radiculopathy symptoms. Denies lower extremity numbness or weakness. No appetite changes. Occasional night sweats which she attributes to postmenopausal. Denies lower extremity numbness or weakness.  Past Medical History  Diagnosis Date  . Excess or deficiency of vitamin D   . Basal cell carcinoma   . Microhematuria   . GERD (gastroesophageal reflux disease)   . Osteopenia   . Atypical chest pain   . Thrombocytopenia   . Gallbladder polyp   . Anal fissure   . Hemorrhoids    Past Surgical History  Procedure Laterality Date  . Lung biopsy    . Colonoscopy  2005, 2015  . Upper gastrointestinal endoscopy  2009    reports that she has never smoked. She has never used smokeless tobacco. She reports that she does not drink alcohol or use illicit drugs. family history includes COPD in her mother; Coronary artery disease in her brother; Heart disease in her brother; Lymphoma in her brother; Osteoporosis in her mother; Prostate cancer in her father. There is no history of Colon cancer, Colon polyps, Rectal cancer, or Stomach cancer. No Known  Allergies    Review of Systems  Constitutional: Negative for fatigue.  Eyes: Negative for visual disturbance.  Respiratory: Negative for cough, chest tightness, shortness of breath and wheezing.   Cardiovascular: Positive for leg swelling. Negative for chest pain and palpitations.  Musculoskeletal: Positive for back pain.  Skin: Positive for rash.  Neurological: Negative for dizziness, seizures, syncope, weakness, light-headedness and headaches.       Objective:   Physical Exam  Constitutional: She appears well-developed and well-nourished.  Neck: Neck supple. No JVD present. No thyromegaly present.  Cardiovascular: Normal rate and regular rhythm.  Exam reveals no gallop.   Pulmonary/Chest: Effort normal and breath sounds normal. No respiratory distress. She has no wheezes. She has no rales.  Musculoskeletal:  Only trace nonpitting edema lower legs bilaterally. No foot edema noted. Both feet are warm to touch with excellent distal pulses  Straight leg raises are negative bilaterally. Good range of motion left hip. No localized tenderness. No lumbar back tenderness. No spinal tenderness.  Neurological:  Full-strength lower extremities with symmetric reflexes.  Skin: Rash noted.  Patient has small cluster of vesicles right breast just superior to areola. Covers an area about 2 x 2 centimeters. No pustules           Assessment & Plan:  #1 bilateral leg edema. Relatively mild. Etiology unclear. Relatively nonfocal exam. She is describing 2 week history of increased edema. Weight is up only slightly compared to last visit. Check further labs with urine dipstick, TSH, basic metabolic panel, hepatic panel, BNP.  Doubt cardiac related. #2 shingles right breast. She has a very small patch and very mild pain. We did not recommend Valtrex since she has already had rash for about 5 days now. #3 poorly localized left hip and low back pain for almost 1 year duration. Obtain x-rays left hip.  Doubt severe osteoarthritis changes clinically. Does not have any red flag symptoms such as weight loss, loss of appetite, fever, history of cancer, etc.

## 2015-09-19 NOTE — Patient Instructions (Signed)
Edema Edema is an abnormal buildup of fluids in your bodytissues. Edema is somewhatdependent on gravity to pull the fluid to the lowest place in your body. That makes the condition more common in the legs and thighs (lower extremities). Painless swelling of the feet and ankles is common and becomes more likely as you get older. It is also common in looser tissues, like around your eyes.  When the affected area is squeezed, the fluid may move out of that spot and leave a dent for a few moments. This dent is called pitting.  CAUSES  There are many possible causes of edema. Eating too much salt and being on your feet or sitting for a long time can cause edema in your legs and ankles. Hot weather may make edema worse. Common medical causes of edema include:  Heart failure.  Liver disease.  Kidney disease.  Weak blood vessels in your legs.  Cancer.  An injury.  Pregnancy.  Some medications.  Obesity. SYMPTOMS  Edema is usually painless.Your skin may look swollen or shiny.  DIAGNOSIS  Your health care provider may be able to diagnose edema by asking about your medical history and doing a physical exam. You may need to have tests such as X-rays, an electrocardiogram, or blood tests to check for medical conditions that may cause edema.  TREATMENT  Edema treatment depends on the cause. If you have heart, liver, or kidney disease, you need the treatment appropriate for these conditions. General treatment may include:  Elevation of the affected body part above the level of your heart.  Compression of the affected body part. Pressure from elastic bandages or support stockings squeezes the tissues and forces fluid back into the blood vessels. This keeps fluid from entering the tissues.  Restriction of fluid and salt intake.  Use of a water pill (diuretic). These medications are appropriate only for some types of edema. They pull fluid out of your body and make you urinate more often. This  gets rid of fluid and reduces swelling, but diuretics can have side effects. Only use diuretics as directed by your health care provider. HOME CARE INSTRUCTIONS   Keep the affected body part above the level of your heart when you are lying down.   Do not sit still or stand for prolonged periods.   Do not put anything directly under your knees when lying down.  Do not wear constricting clothing or garters on your upper legs.   Exercise your legs to work the fluid back into your blood vessels. This may help the swelling go down.   Wear elastic bandages or support stockings to reduce ankle swelling as directed by your health care provider.   Eat a low-salt diet to reduce fluid if your health care provider recommends it.   Only take medicines as directed by your health care provider. SEEK MEDICAL CARE IF:   Your edema is not responding to treatment.  You have heart, liver, or kidney disease and notice symptoms of edema.  You have edema in your legs that does not improve after elevating them.   You have sudden and unexplained weight gain. SEEK IMMEDIATE MEDICAL CARE IF:   You develop shortness of breath or chest pain.   You cannot breathe when you lie down.  You develop pain, redness, or warmth in the swollen areas.   You have heart, liver, or kidney disease and suddenly get edema.  You have a fever and your symptoms suddenly get worse. MAKE SURE YOU:     Understand these instructions.  Will watch your condition.  Will get help right away if you are not doing well or get worse.   This information is not intended to replace advice given to you by your health care provider. Make sure you discuss any questions you have with your health care provider.   Document Released: 11/16/2005 Document Revised: 12/07/2014 Document Reviewed: 09/08/2013 Elsevier Interactive Patient Education 2016 [place], which is also known as herpes zoster, is an  infection that causes a painful skin rash and fluid-filled blisters. Shingles is not related to genital herpes, which is a sexually transmitted infection.   Shingles only develops in people who:  Have had chickenpox.  Have received the chickenpox vaccine. (This is rare.) CAUSES Shingles is caused by varicella-zoster virus (VZV). This is the same virus that causes chickenpox. After exposure to VZV, the virus stays in the body in an inactive (dormant) state. Shingles develops if the virus reactivates. This can happen many years after the initial exposure to VZV. It is not known what causes this virus to reactivate. RISK FACTORS People who have had chickenpox or received the chickenpox vaccine are at risk for shingles. Infection is more common in people who:  Are older than age 36.  Have a weakened defense (immune) system, such as those with HIV, AIDS, or cancer.  Are taking medicines that weaken the immune system, such as transplant medicines.  Are under great stress. SYMPTOMS Early symptoms of this condition include itching, tingling, and pain in an area on your skin. Pain may be described as burning, stabbing, or throbbing. A few days or weeks after symptoms start, a painful red rash appears, usually on one side of the body in a bandlike or beltlike pattern. The rash eventually turns into fluid-filled blisters that break open, scab over, and dry up in about 2-3 weeks. At any time during the infection, you may also develop:  A fever.  Chills.  A headache.  An upset stomach. DIAGNOSIS This condition is diagnosed with a skin exam. Sometimes, skin or fluid samples are taken from the blisters before a diagnosis is made. These samples are examined under a microscope or sent to a lab for testing. TREATMENT There is no specific cure for this condition. Your health care provider will probably prescribe medicines to help you manage pain, recover more quickly, and avoid long-term problems.  Medicines may include:  Antiviral drugs.  Anti-inflammatory drugs.  Pain medicines. If the area involved is on your face, you may be referred to a specialist, such as an eye doctor (ophthalmologist) or an ear, nose, and throat (ENT) doctor to help you avoid eye problems, chronic pain, or disability. HOME CARE INSTRUCTIONS Medicines  Take medicines only as directed by your health care provider.  Apply an anti-itch or numbing cream to the affected area as directed by your health care provider. Blister and Rash Care  Take a cool bath or apply cool compresses to the area of the rash or blisters as directed by your health care provider. This may help with pain and itching.  Keep your rash covered with a loose bandage (dressing). Wear loose-fitting clothing to help ease the pain of material rubbing against the rash.  Keep your rash and blisters clean with mild soap and cool water or as directed by your health care provider.  Check your rash every day for signs of infection. These include redness, swelling, and pain that lasts or increases.  Do not pick your blisters.  Do not scratch your rash. General Instructions  Rest as directed by your health care provider.  Keep all follow-up visits as directed by your health care provider. This is important.  Until your blisters scab over, your infection can cause chickenpox in people who have never had it or been vaccinated against it. To prevent this from happening, avoid contact with other people, especially:  Babies.  Pregnant women.  Children who have eczema.  Elderly people who have transplants.  People who have chronic illnesses, such as leukemia or AIDS. SEEK MEDICAL CARE IF:  Your pain is not relieved with prescribed medicines.  Your pain does not get better after the rash heals.  Your rash looks infected. Signs of infection include redness, swelling, and pain that lasts or increases. SEEK IMMEDIATE MEDICAL CARE IF:  The  rash is on your face or nose.  You have facial pain, pain around your eye area, or loss of feeling on one side of your face.  You have ear pain or you have ringing in your ear.  You have loss of taste.  Your condition gets worse.   This information is not intended to replace advice given to you by your health care provider. Make sure you discuss any questions you have with your health care provider.   Document Released: 11/16/2005 Document Revised: 12/07/2014 Document Reviewed: 09/27/2014 Elsevier Interactive Patient Education [employer].

## 2015-09-20 LAB — BRAIN NATRIURETIC PEPTIDE: Pro B Natriuretic peptide (BNP): 69 pg/mL (ref 0.0–100.0)

## 2015-09-20 LAB — TSH: TSH: 1.43 u[IU]/mL (ref 0.35–4.50)

## 2015-09-23 ENCOUNTER — Telehealth: Payer: Self-pay | Admitting: Internal Medicine

## 2015-09-23 NOTE — Telephone Encounter (Signed)
Patient would like the results of the tests she took on Thursday, Nov 20th.  She also has questions for the doctor.

## 2015-09-24 ENCOUNTER — Encounter: Payer: Self-pay | Admitting: Family Medicine

## 2015-09-25 NOTE — Telephone Encounter (Signed)
Pt is aware of the result.

## 2015-09-27 ENCOUNTER — Other Ambulatory Visit: Payer: Self-pay | Admitting: Internal Medicine

## 2015-09-27 ENCOUNTER — Other Ambulatory Visit: Payer: Self-pay | Admitting: Family Medicine

## 2015-09-27 DIAGNOSIS — R600 Localized edema: Secondary | ICD-10-CM

## 2015-09-27 DIAGNOSIS — Z8249 Family history of ischemic heart disease and other diseases of the circulatory system: Secondary | ICD-10-CM

## 2015-09-27 DIAGNOSIS — M25559 Pain in unspecified hip: Secondary | ICD-10-CM

## 2015-09-27 NOTE — Telephone Encounter (Signed)
Do you want me to make the referral for the Stress-Echo and Dr Tamala Julian? If so i need help answering some of the question that they asking for the stress-echo

## 2015-10-07 ENCOUNTER — Other Ambulatory Visit (HOSPITAL_COMMUNITY): Payer: BLUE CROSS/BLUE SHIELD

## 2015-10-08 ENCOUNTER — Encounter: Payer: Self-pay | Admitting: Family Medicine

## 2015-10-08 ENCOUNTER — Ambulatory Visit (INDEPENDENT_AMBULATORY_CARE_PROVIDER_SITE_OTHER): Payer: BLUE CROSS/BLUE SHIELD | Admitting: Family Medicine

## 2015-10-08 VITALS — BP 124/80 | HR 77 | Ht 67.0 in | Wt 157.0 lb

## 2015-10-08 DIAGNOSIS — M6289 Other specified disorders of muscle: Secondary | ICD-10-CM

## 2015-10-08 DIAGNOSIS — M6580 Other synovitis and tenosynovitis, unspecified site: Secondary | ICD-10-CM

## 2015-10-08 NOTE — Patient Instructions (Addendum)
Good to see you.  Ice 20 minutes 2 times daily. Usually after activity and before bed. Exercises 3 times a week.  Vitamin D 2000 IU daily Turmeric 500mg  daily Iron 65mg  daily or 3 times a week. Take  Do not take fiber with your vitamins See me again in 4 weeks.

## 2015-10-08 NOTE — Assessment & Plan Note (Signed)
Patient given topical anti-inflammatory, icing protocol, patient learned home exercises from athletic trainer, we discussed proper shoewear and how this can be beneficial. Patient will try some vitamin supplementations and come back and see me again in 3-4 weeks. If continuing have pain we'll do ultrasound and possibly any injections or formal physical therapy if necessary.

## 2015-10-08 NOTE — Progress Notes (Signed)
Pre visit review using our clinic review tool, if applicable. No additional management support is needed unless otherwise documented below in the visit note. 

## 2015-10-08 NOTE — Progress Notes (Signed)
[facility] [address], [place] [address] Phone: [phone] Subjective:    I'm seeing this patient by the request  of:  Drema Pry, DO [place].   CC: Hip pain left  GYI:RSWNIOEVOJ VLASTA BASKIN is a 64 y.o. female coming in with complaint of hip pain and left side. Patient was seen 3 weeks ago by another provider. Pictures were taken at that time. This were reviewed by me. Patient has no significant bony normality or any signs of early arthritis. Patient states that this pain seems to be after sitting a long amount of time. When patient stands up sometimes she can have a ballotable pop that gives her some fever pain initially and then seems to resolve after seconds. Sometimes can have some tightness with activity limited seems to improve. Denies any significant radiation down the leg or any numbness or weakness. Still able to do daily activities. States at night seems to do well but when she does stand up it gives her more pain. Mild cramping in the lower extremities at night but otherwise nothing severe. Rates the severity of pain a 5 out of 10. Patient is concerned she does not know what it is and is concerned and wanted these times of a pop in make her fall.  Past Medical History  Diagnosis Date  . Excess or deficiency of vitamin D   . Basal cell carcinoma   . Microhematuria   . GERD (gastroesophageal reflux disease)   . Osteopenia   . Atypical chest pain   . Thrombocytopenia ([place])   . Gallbladder polyp   . Anal fissure   . Hemorrhoids    Past Surgical History  Procedure Laterality Date  . Lung biopsy    . Colonoscopy  2005, 2015  . Upper gastrointestinal endoscopy  2009   Social History  Substance Use Topics  . Smoking status: Never Smoker   . Smokeless tobacco: Never Used  . Alcohol Use: No   No Known Allergies Family History  Problem Relation Age of Onset  . COPD Mother   . Osteoporosis Mother   . Prostate cancer Father     . Heart disease Brother   . Lymphoma Brother     non and hodgkins  . Coronary artery disease Brother   . Colon cancer Neg Hx   . Colon polyps Neg Hx   . Rectal cancer Neg Hx   . Stomach cancer Neg Hx        Past medical history, social, surgical and family history all reviewed in electronic medical record.   Review of Systems: No headache, visual changes, nausea, vomiting, diarrhea, constipation, dizziness, abdominal pain, skin rash, fevers, chills, night sweats, weight loss, swollen lymph nodes, body aches, joint swelling, muscle aches, chest pain, shortness of breath, mood changes.   Objective Blood pressure 124/80, pulse 77, height 5\' 7"  (1.702 m), weight 157 lb (71.215 kg), SpO2 95 %.  General: No apparent distress alert and oriented x3 mood and affect normal, dressed appropriately.  HEENT: Pupils equal, extraocular movements intact  Respiratory: Patient's speak in full sentences and does not appear short of breath  Cardiovascular: No lower extremity edema, non tender, no erythema  Skin: Warm dry intact with no signs of infection or rash on extremities or on axial skeleton.  Abdomen: Soft nontender  Neuro: Cranial nerves II through XII are intact, neurovascularly intact in all extremities with 2+ DTRs and 2+ pulses.  Lymph: No lymphadenopathy of posterior or  anterior cervical chain or axillae bilaterally.  Gait normal with good balance and coordination.  MSK:  Non tender with full range of motion and good stability and symmetric strength and tone of shoulders, elbows, wrist,  knee and ankles bilaterally.  Hip: Left ROM IR: 25 Deg, ER: 45 Deg, Flexion: 120 Deg, Extension: 100 Deg, Abduction: 45 Deg, Adduction: 45 Deg Strength IR: 5/5, ER: 5/5, Flexion: 5/5, Extension: 5/5, Abduction: 5/5, Adduction: 5/5 Pelvic alignment unremarkable to inspection and palpation. Standing hip rotation and gait without trendelenburg sign / unsteadiness. Greater trochanter without tenderness to  palpation. No tenderness over piriformis and greater trochanter. Mild discomfort notice over the tensor fascia lata as well as the proximal aspect of the iliotibial band. Mild pain with Corky Sox No SI joint tenderness and normal minimal SI movement.  Procedure note 99357; 15 minutes spent for Therapeutic exercises as stated in above notes.  This included exercises focusing on stretching, strengthening, with significant focus on eccentric aspects. Pelvic tilt/bracing to help with proper recruitment of the lower abs and pelvic floor muscles  Glute strengthening to properly contract glutes without over-engaging low back and hamstrings - prone hip extension and glute bridge exercises Proper stretching techniques to increase effectiveness for the hip flexors, groin, quads, piriformis and low back when appropriate   Proper technique shown and discussed handout in great detail with ATC.  All questions were discussed and answered.     Impression and Recommendations:     This case required medical decision making of moderate complexity.

## 2015-10-09 ENCOUNTER — Other Ambulatory Visit (HOSPITAL_COMMUNITY): Payer: BLUE CROSS/BLUE SHIELD

## 2015-10-10 ENCOUNTER — Ambulatory Visit (HOSPITAL_BASED_OUTPATIENT_CLINIC_OR_DEPARTMENT_OTHER): Payer: BLUE CROSS/BLUE SHIELD

## 2015-10-10 ENCOUNTER — Ambulatory Visit (HOSPITAL_COMMUNITY): Payer: BLUE CROSS/BLUE SHIELD | Attending: Cardiology

## 2015-10-10 DIAGNOSIS — R600 Localized edema: Secondary | ICD-10-CM | POA: Insufficient documentation

## 2015-10-10 DIAGNOSIS — Z8249 Family history of ischemic heart disease and other diseases of the circulatory system: Secondary | ICD-10-CM

## 2015-10-10 DIAGNOSIS — R0989 Other specified symptoms and signs involving the circulatory and respiratory systems: Secondary | ICD-10-CM

## 2015-10-16 LAB — HEPATIC FUNCTION PANEL
ALK PHOS: 52 U/L (ref 25–125)
ALT: 9 U/L (ref 7–35)
AST: 20 U/L (ref 13–35)
BILIRUBIN, TOTAL: 0.4 mg/dL

## 2015-10-16 LAB — CBC AND DIFFERENTIAL
HCT: 43 % (ref 36–46)
HEMOGLOBIN: 14.6 g/dL (ref 12.0–16.0)
PLATELETS: 137 10*3/uL — AB (ref 150–399)
WBC: 4.7 10^3/mL

## 2015-10-16 LAB — LIPID PANEL
CHOLESTEROL: 183 mg/dL (ref 0–200)
HDL: 56 mg/dL (ref 35–70)
LDL CALC: 115 mg/dL
Triglycerides: 58 mg/dL (ref 40–160)

## 2015-10-16 LAB — BASIC METABOLIC PANEL
BUN: 12 mg/dL (ref 4–21)
Creatinine: 0.6 mg/dL (ref 0.5–1.1)
Glucose: 91 mg/dL
Potassium: 4.4 mmol/L (ref 3.4–5.3)
Sodium: 144 mmol/L (ref 137–147)

## 2015-10-16 LAB — HM MAMMOGRAPHY: HM MAMMO: NORMAL

## 2015-10-16 LAB — HEMOGLOBIN A1C: Hemoglobin A1C: 5.2

## 2015-10-16 LAB — TSH: TSH: 0.99 u[IU]/mL (ref 0.41–5.90)

## 2015-10-18 ENCOUNTER — Encounter: Payer: Self-pay | Admitting: Family Medicine

## 2015-10-21 LAB — HM PAP SMEAR: HM PAP: NORMAL

## 2015-11-06 ENCOUNTER — Ambulatory Visit: Payer: BLUE CROSS/BLUE SHIELD | Admitting: Family Medicine

## 2015-11-11 ENCOUNTER — Other Ambulatory Visit: Payer: BLUE CROSS/BLUE SHIELD

## 2015-11-14 ENCOUNTER — Other Ambulatory Visit (INDEPENDENT_AMBULATORY_CARE_PROVIDER_SITE_OTHER): Payer: BLUE CROSS/BLUE SHIELD

## 2015-11-14 ENCOUNTER — Encounter: Payer: Self-pay | Admitting: Family Medicine

## 2015-11-14 ENCOUNTER — Ambulatory Visit (INDEPENDENT_AMBULATORY_CARE_PROVIDER_SITE_OTHER): Payer: BLUE CROSS/BLUE SHIELD | Admitting: Family Medicine

## 2015-11-14 VITALS — BP 104/72 | HR 76 | Ht 67.0 in | Wt 157.0 lb

## 2015-11-14 DIAGNOSIS — M25552 Pain in left hip: Secondary | ICD-10-CM

## 2015-11-14 DIAGNOSIS — M7602 Gluteal tendinitis, left hip: Secondary | ICD-10-CM | POA: Insufficient documentation

## 2015-11-14 NOTE — Progress Notes (Signed)
Pre visit review using our clinic review tool, if applicable. No additional management support is needed unless otherwise documented below in the visit note. 

## 2015-11-14 NOTE — Patient Instructions (Signed)
Great to see you Happy holidays!  We will get you in with physical therapy in [place] Try pennsaid pinkie amount topically 2 times daily as needed.  Ice is your friend See me again in 3-4 weeks to make sure you are doing better

## 2015-11-14 NOTE — Progress Notes (Signed)
[facility] [address] [place], [place] [address] Phone: [phone] Subjective:    I'm seeing this patient by the request  of:  Garret Reddish, MD [place].   CC: Hip pain left follow up  [id number] Elizabeth Douglas is a 64 y.o. female coming in with complaint of hip pain and left side. Patient was not have more of a tensor fascia lata syndrome previously. Patient was given some exercises. An states that the anterior aspect of her hip does feel better but she is having worsening symptoms on the lateral aspect of the hip. Patient discusses it as a dull, throbbing aching sensation. Patient states that overall it does give her significant amount of discomfort. Patient describes the pain as above. Patient states that it is starting to wake her up at night as well.  Past Medical History  Diagnosis Date  . Excess or deficiency of vitamin D   . Basal cell carcinoma   . Microhematuria   . GERD (gastroesophageal reflux disease)   . Osteopenia   . Atypical chest pain   . Thrombocytopenia ([place])   . Gallbladder polyp   . Anal fissure   . Hemorrhoids    Past Surgical History  Procedure Laterality Date  . Lung biopsy    . Colonoscopy  2005, 2015  . Upper gastrointestinal endoscopy  2009   Social History  Substance Use Topics  . Smoking status: Never Smoker   . Smokeless tobacco: Never Used  . Alcohol Use: No   No Known Allergies Family History  Problem Relation Age of Onset  . COPD Mother   . Osteoporosis Mother   . Prostate cancer Father   . Heart disease Brother   . Lymphoma Brother     non and hodgkins  . Coronary artery disease Brother   . Colon cancer Neg Hx   . Colon polyps Neg Hx   . Rectal cancer Neg Hx   . Stomach cancer Neg Hx        Past medical history, social, surgical and family history all reviewed in electronic medical record.   Review of Systems: No headache, visual changes, nausea, vomiting, diarrhea, constipation,  dizziness, abdominal pain, skin rash, fevers, chills, night sweats, weight loss, swollen lymph nodes, body aches, joint swelling, muscle aches, chest pain, shortness of breath, mood changes.   Objective Blood pressure 104/72, pulse 76, height 5\' 7"  (1.702 m), weight 157 lb (71.215 kg), SpO2 96 %.  General: No apparent distress alert and oriented x3 mood and affect normal, dressed appropriately.  HEENT: Pupils equal, extraocular movements intact  Respiratory: Patient's speak in full sentences and does not appear short of breath  Cardiovascular: No lower extremity edema, non tender, no erythema  Skin: Warm dry intact with no signs of infection or rash on extremities or on axial skeleton.  Abdomen: Soft nontender  Neuro: Cranial nerves II through XII are intact, neurovascularly intact in all extremities with 2+ DTRs and 2+ pulses.  Lymph: No lymphadenopathy of posterior or anterior cervical chain or axillae bilaterally.  Gait normal with good balance and coordination.  MSK:  Non tender with full range of motion and good stability and symmetric strength and tone of shoulders, elbows, wrist,  knee and ankles bilaterally.  Hip: Left ROM IR: 25 Deg, ER: 45 Deg, Flexion: 120 Deg, Extension: 100 Deg, Abduction: 45 Deg, Adduction: 45 Deg Strength IR: 5/5, ER: 5/5, Flexion: 5/5, Extension: 5/5, Abduction: 5/5, Adduction: 5/5 Pelvic alignment unremarkable to inspection  and palpation. Standing hip rotation and gait without trendelenburg sign / unsteadiness. Greater trochanter severe tenderness today. This is worse than previous exam. Tenderness over the gluteal tendon as well.  Mild discomfort notice over the tensor fascia lata as well as the proximal aspect of the iliotibial band. Mild pain with Corky Sox No SI joint tenderness and normal minimal SI movement.  MSK US performed of: Left This study was ordered, performed, and interpreted by Charlann Boxer D.O.  Hip: Trochanteric bursa with significant  hypoechoic changes and swelling Acetabular labrum visualized and without tears, displacement, or effusion in joint. Femoral neck appears unremarkable without increased power doppler signal along Cortex. Significant hypoechoic changes of the gluteal tendon at insertion on greater to enteric area.  IMPRESSION:  Greater trochanter bursitis, gluteal tendinitis   Procedure: Real-time Ultrasound Guided Injection of left greater trochanteric bursitis secondary to patient's body habitus and patient's gluteal tendon Device: GE Logiq E  Ultrasound guided injection is preferred based studies that show increased duration, increased effect, greater accuracy, decreased procedural pain, increased response rate, and decreased cost with ultrasound guided versus blind injection.  Verbal informed consent obtained.  Time-out conducted.  Noted no overlying erythema, induration, or other signs of local infection.  Skin prepped in a sterile fashion.  Local anesthesia: Topical Ethyl chloride.  With sterile technique and under real time ultrasound guidance:  Greater trochanteric area was visualized and patient's bursa was noted. A 22-gauge 3 inch needle was inserted and 4 cc of 0.5% Marcaine and 1 cc of Kenalog 40 mg/dL was injected. Pictures taken Completed without difficulty  Pain immediately resolved suggesting accurate placement of the medication.  Advised to call if fevers/chills, erythema, induration, drainage, or persistent bleeding.  Images permanently stored and available for review in the ultrasound unit.  Impression: Technically successful ultrasound guided injection.     Impression and Recommendations:     This case required medical decision making of moderate complexity.

## 2015-11-14 NOTE — Assessment & Plan Note (Signed)
Patient given an injection today and tolerated the procedure very well. We discussed icing regimen and home exercises. We discussed which activities to do an which was potentially avoid. Patient will be sent to formal physical therapy. I think that this will be beneficial. Patient will continue the other home exercises. Given trial of topical anti-inflammatories and we'll see patient back again in 4-6 weeks.

## 2015-11-18 ENCOUNTER — Encounter: Payer: BLUE CROSS/BLUE SHIELD | Admitting: Internal Medicine

## 2015-11-20 ENCOUNTER — Telehealth: Payer: Self-pay | Admitting: Family Medicine

## 2015-11-20 ENCOUNTER — Ambulatory Visit (INDEPENDENT_AMBULATORY_CARE_PROVIDER_SITE_OTHER)
Admission: RE | Admit: 2015-11-20 | Discharge: 2015-11-20 | Disposition: A | Discharge status: Home / Self Care | Payer: BLUE CROSS/BLUE SHIELD | Source: Ambulatory Visit | Attending: Family Medicine | Admitting: Family Medicine

## 2015-11-20 ENCOUNTER — Ambulatory Visit (INDEPENDENT_AMBULATORY_CARE_PROVIDER_SITE_OTHER): Payer: BLUE CROSS/BLUE SHIELD | Admitting: Family Medicine

## 2015-11-20 ENCOUNTER — Encounter: Payer: Self-pay | Admitting: Family Medicine

## 2015-11-20 VITALS — BP 106/82 | HR 72 | Wt 155.0 lb

## 2015-11-20 DIAGNOSIS — R200 Anesthesia of skin: Secondary | ICD-10-CM

## 2015-11-20 DIAGNOSIS — R208 Other disturbances of skin sensation: Secondary | ICD-10-CM | POA: Diagnosis not present

## 2015-11-20 DIAGNOSIS — M5416 Radiculopathy, lumbar region: Secondary | ICD-10-CM | POA: Diagnosis not present

## 2015-11-20 MED ORDER — GABAPENTIN 100 MG PO CAPS: 100.0000 mg | ORAL_CAPSULE | Freq: Every day | ORAL | Status: DC

## 2015-11-20 MED ORDER — METHYLPREDNISOLONE ACETATE 80 MG/ML IJ SUSP
80.0000 mg | Freq: Once | INTRAMUSCULAR | Status: AC
  Administered 2015-11-20: 80 mg via INTRAMUSCULAR

## 2015-11-20 MED ORDER — KETOROLAC TROMETHAMINE 60 MG/2ML IM SOLN
60.0000 mg | Freq: Once | INTRAMUSCULAR | Status: AC
  Administered 2015-11-20: 60 mg via INTRAMUSCULAR

## 2015-11-20 NOTE — Telephone Encounter (Signed)
Patient Name: Elizabeth Douglas  DOB: 1950-12-16    Initial Comment Caller states she has had right leg and foot pain since Sunday. Feels numb, has purple spot on middle of foot.   Nurse Assessment  Nurse: Leilani Merl, RN, Heather Date/Time (Eastern Time): 11/20/2015 12:20:53 PM  Confirm and document reason for call. If symptomatic, describe symptoms. ---Caller states she has had right leg and foot pain since Sunday. Feels numb, has purple spot on middle of foot.  Has the patient traveled out of the country within the last 30 days? ---Not Applicable  Does the patient have any new or worsening symptoms? ---Yes  Will a triage be completed? ---Yes  Related visit to physician within the last 2 weeks? ---No  Does the PT have any chronic conditions? (i.e. diabetes, asthma, etc.) ---Yes  List chronic conditions. ---see MR  Is this a behavioral health or substance abuse call? ---No     Guidelines    Guideline Title Affirmed Question Affirmed Notes  Leg Pain [1] Thigh or calf pain AND [2] only 1 side AND [3] present > 1 hour    Final Disposition User   See Physician within 4 Hours (or PCP triage) [place], RN, Nira Conn    Comments  Attempted appt, none available, caller states that she does not want to be seen at [facility] or ED, she would like for one of the doctors to try to work her in today.   Referrals  REFERRED TO PCP OFFICE   Disagree/Comply: Comply

## 2015-11-20 NOTE — Progress Notes (Signed)
[facility] [address] [place], [place] [address] Phone: [phone] Subjective:     CC: right hip pain  [id number] Elizabeth Douglas is a 64 y.o. female coming in with complaint of hip pain now on the right side. Patient's was seen previously and was given an injection of the left hip. States that that has completely resolved after the injection. Patient continues to have discomfort on the right side now. Started approximate 48 hours ago. Seems to be on the lateral aspect the hip and radiating down towards her leg causing some numbness on the plantar aspect of the foot. Seems to be worse with activity. States that it's uncomfortable at night. Has not responded to over-the-counter medications. Has not tried any other home modalities. Denies any weakness but states that the numbness seems to be more chronic. Does not remember any true injury. Patient is concerned that something is wrong.  Past Medical History  Diagnosis Date  . Excess or deficiency of vitamin D   . Basal cell carcinoma   . Microhematuria   . GERD (gastroesophageal reflux disease)   . Osteopenia   . Atypical chest pain   . Thrombocytopenia ([place])   . Gallbladder polyp   . Anal fissure   . Hemorrhoids    Past Surgical History  Procedure Laterality Date  . Lung biopsy    . Colonoscopy  2005, 2015  . Upper gastrointestinal endoscopy  2009   Social History  Substance Use Topics  . Smoking status: Never Smoker   . Smokeless tobacco: Never Used  . Alcohol Use: No   No Known Allergies Family History  Problem Relation Age of Onset  . COPD Mother   . Osteoporosis Mother   . Prostate cancer Father   . Heart disease Brother   . Lymphoma Brother     non and hodgkins  . Coronary artery disease Brother   . Colon cancer Neg Hx   . Colon polyps Neg Hx   . Rectal cancer Neg Hx   . Stomach cancer Neg Hx        Past medical history, social, surgical and family history all reviewed in  electronic medical record.   Review of Systems: No headache, visual changes, nausea, vomiting, diarrhea, constipation, dizziness, abdominal pain, skin rash, fevers, chills, night sweats, weight loss, swollen lymph nodes, body aches, joint swelling, muscle aches, chest pain, shortness of breath, mood changes.   Objective Blood pressure 106/82, pulse 72, weight 155 lb (70.308 kg).  General: No apparent distress alert and oriented x3 mood and affect normal, dressed appropriately.  HEENT: Pupils equal, extraocular movements intact  Respiratory: Patient's speak in full sentences and does not appear short of breath  Cardiovascular: No lower extremity edema, non tender, no erythema  Skin: Warm dry intact with no signs of infection or rash on extremities or on axial skeleton.  Abdomen: Soft nontender  Neuro: Cranial nerves II through XII are intact, neurovascularly intact in all extremities with 2+ DTRs and 2+ pulses.  Lymph: No lymphadenopathy of posterior or anterior cervical chain or axillae bilaterally.  Gait normal with good balance and coordination.  MSK:  Non tender with full range of motion and good stability and symmetric strength and tone of shoulders, elbows, wrist,  knee and ankles bilaterally.  Hip: right ROM IR: 25 Deg, ER: 45 Deg, Flexion: 120 Deg, Extension: 100 Deg, Abduction: 45 Deg, Adduction: 45 Deg Strength IR: 5/5, ER: 5/5, Flexion: 5/5, Extension: 5/5, Abduction: 5/5,  Adduction: 5/5 Pelvic alignment unremarkable to inspection and palpation. Standing hip rotation and gait without trendelenburg sign / unsteadiness. Greater trochanter minimal discomfort Patient does have a positive straight leg test on the right side with seating.  Mild pain with Corky Sox No SI joint tenderness and normal minimal SI movement. Tendon reflexes seem to be intact but patient does have what appears to be one beat of clonus of the right foot compared to the contralateral side. Testing numerous times  with continued same results. Mild discomfort of the paraspinal musculature of the lumbar spine on the right side    Impression and Recommendations:     This case required medical decision making of moderate complexity.

## 2015-11-20 NOTE — Telephone Encounter (Signed)
Pt doesn't want to go to ED or Urgent Care. Pt is aware that theres no appt available for today

## 2015-11-20 NOTE — Assessment & Plan Note (Signed)
I do believe the patient is having more of a lumbar radiculopathy. X-rays of the lower back ordered today. Patient given 2 injections including a steroid to decrease inflammation. Patient given gabapentin is a neuromodulator. Home exercises were given for range of motion starting in 48 hours. Differential could include bursitis of the hip but minimally tender. No signs of piriformis syndrome. No signs of recent illnesses or injury to this side. Patient seemed to be neurovascularly intact today but did have the one beat of clonus which make me concerned. Patient knows if the numbness becomes worse or some weakness occurs that she needs to seek medical attention immediately. Patient will have close follow-up in the next 7 days and was given anti-inflammatories.

## 2015-11-20 NOTE — Telephone Encounter (Signed)
Spoke with pt, scheduled for 3pm today.

## 2015-11-20 NOTE — Patient Instructions (Signed)
Good to see you Ice 20 minutes 2 times daily. Usually after activity and before bed. 2 injections today Xray on way out Duexis 3 times daily for next 6 days but stop if hurts your stomach.  Start it tomorrow.  Gabapentin 100mg  at night will help with nerve pain.  See me again 1 week to make sure you are doing well.

## 2015-11-20 NOTE — Telephone Encounter (Signed)
Appears she saw Dr. Tamala Julian of sports medicine for this- any further follow up needed? Please tell her sorry that I was out of the office.

## 2015-11-20 NOTE — Telephone Encounter (Signed)
Pt says she is having right leg and foot pain since Sunday. Feels numb, with bottom of leg has not color except for a purple spot on middle of foot She states that her PCP wanted her to see Dr. Tamala Julian It looks like she was transferred to [facility] from her [facility] She is wondering if Dr. Tamala Julian can work her in. Her number is [phone]

## 2015-11-20 NOTE — Telephone Encounter (Signed)
If truly cold and discolored need to go to emergency room.  Otherwise can see her before 3pm.  Once again best would be emergency room.

## 2015-11-22 ENCOUNTER — Encounter: Payer: Self-pay | Admitting: Family Medicine

## 2015-11-25 ENCOUNTER — Other Ambulatory Visit: Payer: Self-pay | Admitting: Internal Medicine

## 2015-11-28 ENCOUNTER — Encounter: Payer: Self-pay | Admitting: Family Medicine

## 2015-11-28 ENCOUNTER — Ambulatory Visit: Payer: BLUE CROSS/BLUE SHIELD | Admitting: Physical Therapy

## 2015-11-28 ENCOUNTER — Ambulatory Visit (INDEPENDENT_AMBULATORY_CARE_PROVIDER_SITE_OTHER): Payer: BLUE CROSS/BLUE SHIELD | Admitting: Family Medicine

## 2015-11-28 VITALS — BP 122/80 | HR 73 | Ht 67.0 in | Wt 155.0 lb

## 2015-11-28 DIAGNOSIS — M79661 Pain in right lower leg: Secondary | ICD-10-CM

## 2015-11-28 MED ORDER — VITAMIN D (ERGOCALCIFEROL) 1.25 MG (50000 UNIT) PO CAPS: 50000.0000 [IU] | ORAL_CAPSULE | ORAL | Status: DC

## 2015-11-28 NOTE — Patient Instructions (Addendum)
I am sorry you are not better I feel that you are more likely having something to do with a calf.  Vitamin D 50000 IU weekly for next 8 weeks We will get the following Ultrasound to rule out clot ABI to see if it is vascular.  Try to walk and cause the discomfort before the test.  EMG of right leg to make sure not nerve related.  In the meantime.... Try Compression sleeve to calf with walking and maybe daily ([facility] be fine) Heel lift in right shoe  You decide on the antiinflammatories if you want to continue or not.  I would do the gabapentin.  I will discuss with you over my chart on next step Happy New Year!

## 2015-11-28 NOTE — Progress Notes (Signed)
[facility] [address] [place], [place] [address] Phone: [phone] Subjective:     CC: right hip pain follow-up  [id number] Elizabeth Douglas is a 64 y.o. female coming in with complaint of hip pain now on the right side.patient was seen previously and there was concern for more of a lumbar radiculopathy. Patient was having significant pain that seemed to go down her leg. Had a positive straight leg test. Patient elected to try conservative therapy. Patient was given gabapentin. Patient did have x-rays done. These were independently visualized by me today. Patient's back x-ray shows some mild scoliosis and very mild osteoarthritic changes. Patient was to do home exercises and range of motion. Patient states her back pain is much improved but continues to have the difficulty in the calf. Patient states that it seems to be very tight. Patient describes the pain is with walking is even and numbness. Seems to go from medial to anterior to even lateral aspect of the calf. Does not seem to be passed in her knee at any point. States that it is enough pain that keeps her from walking. Has not had any more of the swelling or discoloration as she did the previous time but unfortunately the numbness seems to be occurring even more rapidly with less activity. States it even cause to cramp at night. Denies any fevers or chills or any abnormal weight loss.  Past Medical History  Diagnosis Date  . Excess or deficiency of vitamin D   . Basal cell carcinoma   . Microhematuria   . GERD (gastroesophageal reflux disease)   . Osteopenia   . Atypical chest pain   . Thrombocytopenia ([place])   . Gallbladder polyp   . Anal fissure   . Hemorrhoids    Past Surgical History  Procedure Laterality Date  . Lung biopsy    . Colonoscopy  2005, 2015  . Upper gastrointestinal endoscopy  2009   Social History  Substance Use Topics  . Smoking status: Never Smoker   . Smokeless tobacco:  Never Used  . Alcohol Use: No   No Known Allergies Family History  Problem Relation Age of Onset  . COPD Mother   . Osteoporosis Mother   . Prostate cancer Father   . Heart disease Brother   . Lymphoma Brother     non and hodgkins  . Coronary artery disease Brother   . Colon cancer Neg Hx   . Colon polyps Neg Hx   . Rectal cancer Neg Hx   . Stomach cancer Neg Hx        Past medical history, social, surgical and family history all reviewed in electronic medical record.   Review of Systems: No headache, visual changes, nausea, vomiting, diarrhea, constipation, dizziness, abdominal pain, skin rash, fevers, chills, night sweats, weight loss, swollen lymph nodes, body aches, joint swelling, muscle aches, chest pain, shortness of breath, mood changes.   Objective Blood pressure 122/80, pulse 73, height 5\' 7"  (1.702 m), weight 155 lb (70.308 kg), SpO2 97 %.  General: No apparent distress alert and oriented x3 mood and affect normal, dressed appropriately.  HEENT: Pupils equal, extraocular movements intact  Respiratory: Patient's speak in full sentences and does not appear short of breath  Cardiovascular: No lower extremity edema, non tender, no erythema  Skin: Warm dry intact with no signs of infection or rash on extremities or on axial skeleton.  Abdomen: Soft nontender  Neuro: Cranial nerves II through XII are  intact, neurovascularly intact in all extremities with 2+ DTRs and 2+ pulses.  Lymph: No lymphadenopathy of posterior or anterior cervical chain or axillae bilaterally.  Gait normal with good balance and coordination.  MSK:  Non tender with full range of motion and good stability and symmetric strength and tone of shoulders, elbows, wrist,  knee and ankles bilaterally.  Hip: right ROM IR: 25 Deg, ER: 45 Deg, Flexion: 120 Deg, Extension: 100 Deg, Abduction: 45 Deg, Adduction: 45 Deg Strength IR: 5/5, ER: 5/5, Flexion: 5/5, Extension: 5/5, Abduction: 5/5, Adduction:  5/5 Pelvic alignment unremarkable to inspection and palpation. Standing hip rotation and gait without trendelenburg sign / unsteadiness. Greater trochanter minimal discomfort Negative straight leg test.  Mild pain with Corky Sox No SI joint tenderness and normal minimal SI movement. Tendon reflexes seem to be intact but became patient continues to have some mild one beat of clonus of the right but not the left. nontender in the back.    Impression and Recommendations:     This case required medical decision making of moderate complexity.

## 2015-11-28 NOTE — Progress Notes (Signed)
Pre visit review using our clinic review tool, if applicable. No additional management support is needed unless otherwise documented below in the visit note. 

## 2015-11-28 NOTE — Assessment & Plan Note (Signed)
Differential is quite broad at this time and will need further workup. We are going to see if patient's pain is secondary to either an exertional compartment syndrome, vascular versus neurologic compromise. Patient will also have a Doppler of the lower extremity to rule out any DVT notices low likelihood. Patient will have an ABI done to make sure there is no vascular compromise and will do some exertional walking before the test to see if this is causing  The symptoms. In addition this would like to get an EMG to rule out any other neurologic causes. Would consider this pain more of an exertional compartment syndrome but seems to change facilities avoid this occurs including the medial, anterior and lateral compartments. No foot drop seems to be related with it. Patient did have one episode of swelling and discoloration but by the time patient presented to the office we did not see this. Reviewing patient's chart in greater detail patient did have vitamin D deficiency and patient will be put on once weekly vitamin D if this is contribute in. Patient then depending on findings we'll change medical management as needed.  Spent  25 minutes with patient face-to-face and had greater than 50% of counseling including as described above in assessment and plan.

## 2015-11-29 ENCOUNTER — Emergency Department (HOSPITAL_COMMUNITY): Payer: BLUE CROSS/BLUE SHIELD | Admitting: Certified Registered"

## 2015-11-29 ENCOUNTER — Encounter (HOSPITAL_COMMUNITY): Payer: Self-pay | Admitting: *Deleted

## 2015-11-29 ENCOUNTER — Ambulatory Visit (HOSPITAL_COMMUNITY)
Admission: RE | Admit: 2015-11-29 | Discharge: 2015-11-29 | Disposition: A | Discharge status: Home / Self Care | Payer: BLUE CROSS/BLUE SHIELD | Source: Ambulatory Visit | Attending: Cardiovascular Disease | Admitting: Cardiovascular Disease

## 2015-11-29 ENCOUNTER — Emergency Department (HOSPITAL_COMMUNITY): Payer: BLUE CROSS/BLUE SHIELD

## 2015-11-29 ENCOUNTER — Other Ambulatory Visit (HOSPITAL_COMMUNITY): Payer: Self-pay | Admitting: Pediatrics

## 2015-11-29 ENCOUNTER — Ambulatory Visit (HOSPITAL_COMMUNITY)
Admission: RE | Admit: 2015-11-29 | Discharge: 2015-11-29 | Disposition: A | Discharge status: Home / Self Care | Payer: BLUE CROSS/BLUE SHIELD | Source: Ambulatory Visit | Attending: Family Medicine | Admitting: Family Medicine

## 2015-11-29 ENCOUNTER — Encounter (HOSPITAL_COMMUNITY)
Admission: EM | Disposition: A | Discharge status: Home / Self Care | Payer: Self-pay | Source: Home / Self Care | Attending: Vascular Surgery

## 2015-11-29 ENCOUNTER — Inpatient Hospital Stay (HOSPITAL_COMMUNITY)
Admission: EM | Admit: 2015-11-29 | Discharge: 2015-12-03 | DRG: 254 | Disposition: A | Discharge status: Home / Self Care | Payer: BLUE CROSS/BLUE SHIELD | Attending: Vascular Surgery | Admitting: Vascular Surgery

## 2015-11-29 ENCOUNTER — Other Ambulatory Visit: Payer: Self-pay

## 2015-11-29 DIAGNOSIS — M8580 Other specified disorders of bone density and structure, unspecified site: Secondary | ICD-10-CM | POA: Diagnosis present

## 2015-11-29 DIAGNOSIS — I7090 Unspecified atherosclerosis: Secondary | ICD-10-CM | POA: Diagnosis present

## 2015-11-29 DIAGNOSIS — M79661 Pain in right lower leg: Secondary | ICD-10-CM

## 2015-11-29 DIAGNOSIS — I70201 Unspecified atherosclerosis of native arteries of extremities, right leg: Secondary | ICD-10-CM

## 2015-11-29 DIAGNOSIS — N950 Postmenopausal bleeding: Secondary | ICD-10-CM

## 2015-11-29 DIAGNOSIS — I743 Embolism and thrombosis of arteries of the lower extremities: Principal | ICD-10-CM | POA: Diagnosis present

## 2015-11-29 DIAGNOSIS — M79604 Pain in right leg: Secondary | ICD-10-CM | POA: Diagnosis present

## 2015-11-29 DIAGNOSIS — I749 Embolism and thrombosis of unspecified artery: Secondary | ICD-10-CM

## 2015-11-29 DIAGNOSIS — E559 Vitamin D deficiency, unspecified: Secondary | ICD-10-CM | POA: Diagnosis present

## 2015-11-29 DIAGNOSIS — D696 Thrombocytopenia, unspecified: Secondary | ICD-10-CM | POA: Diagnosis present

## 2015-11-29 DIAGNOSIS — Z419 Encounter for procedure for purposes other than remedying health state, unspecified: Secondary | ICD-10-CM

## 2015-11-29 DIAGNOSIS — K219 Gastro-esophageal reflux disease without esophagitis: Secondary | ICD-10-CM | POA: Diagnosis present

## 2015-11-29 DIAGNOSIS — N939 Abnormal uterine and vaginal bleeding, unspecified: Secondary | ICD-10-CM | POA: Diagnosis present

## 2015-11-29 DIAGNOSIS — I82409 Acute embolism and thrombosis of unspecified deep veins of unspecified lower extremity: Secondary | ICD-10-CM

## 2015-11-29 DIAGNOSIS — R079 Chest pain, unspecified: Secondary | ICD-10-CM | POA: Diagnosis not present

## 2015-11-29 HISTORY — PX: THROMBECTOMY FEMORAL ARTERY: SHX6406

## 2015-11-29 HISTORY — PX: INTRAOPERATIVE ARTERIOGRAM: SHX5157

## 2015-11-29 LAB — DIC (DISSEMINATED INTRAVASCULAR COAGULATION)PANEL
D-Dimer, Quant: 0.86 ug/mL-FEU — ABNORMAL HIGH (ref 0.00–0.50)
INR: 1 (ref 0.00–1.49)
Platelets: 121 10*3/uL — ABNORMAL LOW (ref 150–400)
Smear Review: NONE SEEN

## 2015-11-29 LAB — BASIC METABOLIC PANEL
ANION GAP: 9 (ref 5–15)
BUN: 11 mg/dL (ref 6–20)
CALCIUM: 9.1 mg/dL (ref 8.9–10.3)
CO2: 27 mmol/L (ref 22–32)
Chloride: 105 mmol/L (ref 101–111)
Creatinine, Ser: 0.71 mg/dL (ref 0.44–1.00)
Glucose, Bld: 101 mg/dL — ABNORMAL HIGH (ref 65–99)
POTASSIUM: 4.1 mmol/L (ref 3.5–5.1)
Sodium: 141 mmol/L (ref 135–145)

## 2015-11-29 LAB — URINALYSIS, ROUTINE W REFLEX MICROSCOPIC
Bilirubin Urine: NEGATIVE
Glucose, UA: NEGATIVE mg/dL
KETONES UR: NEGATIVE mg/dL
LEUKOCYTES UA: NEGATIVE
NITRITE: NEGATIVE
PROTEIN: NEGATIVE mg/dL
Specific Gravity, Urine: 1.009 (ref 1.005–1.030)
pH: 7.5 (ref 5.0–8.0)

## 2015-11-29 LAB — CBC WITH DIFFERENTIAL/PLATELET
Basophils Absolute: 0 10*3/uL (ref 0.0–0.1)
Basophils Relative: 0 %
EOS PCT: 1 %
Eosinophils Absolute: 0.1 10*3/uL (ref 0.0–0.7)
HEMATOCRIT: 41 % (ref 36.0–46.0)
Hemoglobin: 13.4 g/dL (ref 12.0–15.0)
LYMPHS ABS: 1.4 10*3/uL (ref 0.7–4.0)
LYMPHS PCT: 23 %
MCH: 31.4 pg (ref 26.0–34.0)
MCHC: 32.7 g/dL (ref 30.0–36.0)
MCV: 96 fL (ref 78.0–100.0)
MONO ABS: 0.4 10*3/uL (ref 0.1–1.0)
Monocytes Relative: 6 %
NEUTROS ABS: 4.3 10*3/uL (ref 1.7–7.7)
Neutrophils Relative %: 70 %
PLATELETS: 123 10*3/uL — AB (ref 150–400)
RBC: 4.27 MIL/uL (ref 3.87–5.11)
RDW: 12.6 % (ref 11.5–15.5)
WBC: 6.2 10*3/uL (ref 4.0–10.5)

## 2015-11-29 LAB — WET PREP, GENITAL
CLUE CELLS WET PREP: NONE SEEN
SPERM: NONE SEEN
Trich, Wet Prep: NONE SEEN
YEAST WET PREP: NONE SEEN

## 2015-11-29 LAB — TYPE AND SCREEN
ABO/RH(D): O POS
ANTIBODY SCREEN: NEGATIVE

## 2015-11-29 LAB — URINE MICROSCOPIC-ADD ON

## 2015-11-29 LAB — DIC (DISSEMINATED INTRAVASCULAR COAGULATION) PANEL
APTT: 27 s (ref 24–37)
FIBRINOGEN: 355 mg/dL (ref 204–475)
PROTHROMBIN TIME: 13.4 s (ref 11.6–15.2)

## 2015-11-29 LAB — PROTIME-INR
INR: 0.97 (ref 0.00–1.49)
PROTHROMBIN TIME: 13.1 s (ref 11.6–15.2)

## 2015-11-29 LAB — SURGICAL PCR SCREEN
MRSA, PCR: NEGATIVE
Staphylococcus aureus: NEGATIVE

## 2015-11-29 LAB — PREPARE RBC (CROSSMATCH)

## 2015-11-29 SURGERY — THROMBECTOMY, ARTERY, FEMORAL
Anesthesia: General | Site: Leg Upper | Laterality: Right

## 2015-11-29 MED ORDER — LIDOCAINE HCL (CARDIAC) 20 MG/ML IV SOLN
INTRAVENOUS | Status: DC | PRN
  Administered 2015-11-29: 60 mg via INTRAVENOUS

## 2015-11-29 MED ORDER — HEPARIN SODIUM (PORCINE) 1000 UNIT/ML IJ SOLN
INTRAMUSCULAR | Status: DC | PRN
  Administered 2015-11-29: 2 mL via INTRAVENOUS
  Administered 2015-11-29: 7 mL via INTRAVENOUS
  Administered 2015-11-29: 2 mL via INTRAVENOUS
  Administered 2015-11-29: 3 mL via INTRAVENOUS
  Administered 2015-11-29: 2 mL via INTRAVENOUS

## 2015-11-29 MED ORDER — MIDAZOLAM HCL 5 MG/5ML IJ SOLN
INTRAMUSCULAR | Status: DC | PRN
  Administered 2015-11-29: 2 mg via INTRAVENOUS

## 2015-11-29 MED ORDER — PROMETHAZINE HCL 25 MG/ML IJ SOLN
6.2500 mg | INTRAMUSCULAR | Status: DC | PRN
  Administered 2015-11-29: 6.25 mg via INTRAVENOUS

## 2015-11-29 MED ORDER — HYDROMORPHONE HCL 1 MG/ML IJ SOLN: 0.2500 mg | INTRAMUSCULAR | Status: DC | PRN

## 2015-11-29 MED ORDER — EPHEDRINE SULFATE 50 MG/ML IJ SOLN
INTRAMUSCULAR | Status: DC | PRN
  Administered 2015-11-29: 5 mg via INTRAVENOUS

## 2015-11-29 MED ORDER — FENTANYL CITRATE (PF) 250 MCG/5ML IJ SOLN
INTRAMUSCULAR | Status: AC
  Filled 2015-11-29: qty 5

## 2015-11-29 MED ORDER — LACTATED RINGERS IV SOLN
INTRAVENOUS | Status: DC
  Administered 2015-11-29: 16:00:00 via INTRAVENOUS

## 2015-11-29 MED ORDER — LACTATED RINGERS IV SOLN
INTRAVENOUS | Status: DC | PRN
Start: 2015-11-29 — End: 2015-11-29
  Administered 2015-11-29 (×3): via INTRAVENOUS

## 2015-11-29 MED ORDER — HEPARIN SODIUM (PORCINE) 1000 UNIT/ML IJ SOLN
INTRAMUSCULAR | Status: AC
  Filled 2015-11-29: qty 1

## 2015-11-29 MED ORDER — LIDOCAINE HCL (CARDIAC) 20 MG/ML IV SOLN
INTRAVENOUS | Status: AC
  Filled 2015-11-29: qty 5

## 2015-11-29 MED ORDER — DEXAMETHASONE SODIUM PHOSPHATE 4 MG/ML IJ SOLN
INTRAMUSCULAR | Status: AC
  Filled 2015-11-29: qty 1

## 2015-11-29 MED ORDER — CEFAZOLIN SODIUM-DEXTROSE 2-3 GM-% IV SOLR
INTRAVENOUS | Status: AC
  Filled 2015-11-29: qty 50

## 2015-11-29 MED ORDER — CEFAZOLIN SODIUM-DEXTROSE 2-3 GM-% IV SOLR
INTRAVENOUS | Status: DC | PRN
  Administered 2015-11-29 (×2): 2 g via INTRAVENOUS

## 2015-11-29 MED ORDER — STERILE WATER FOR INJECTION IJ SOLN
INTRAMUSCULAR | Status: AC
  Filled 2015-11-29: qty 10

## 2015-11-29 MED ORDER — PROPOFOL 10 MG/ML IV BOLUS
INTRAVENOUS | Status: DC | PRN
  Administered 2015-11-29: 100 mg via INTRAVENOUS

## 2015-11-29 MED ORDER — SODIUM CHLORIDE 0.9 % IJ SOLN
INTRAMUSCULAR | Status: AC
  Filled 2015-11-29: qty 10

## 2015-11-29 MED ORDER — PROTAMINE SULFATE 10 MG/ML IV SOLN
INTRAVENOUS | Status: AC
  Filled 2015-11-29: qty 5

## 2015-11-29 MED ORDER — ONDANSETRON HCL 4 MG/2ML IJ SOLN
INTRAMUSCULAR | Status: DC | PRN
  Administered 2015-11-29: 4 mg via INTRAVENOUS

## 2015-11-29 MED ORDER — ONDANSETRON HCL 4 MG/2ML IJ SOLN
INTRAMUSCULAR | Status: AC
  Filled 2015-11-29: qty 4

## 2015-11-29 MED ORDER — FENTANYL CITRATE (PF) 100 MCG/2ML IJ SOLN
INTRAMUSCULAR | Status: DC | PRN
  Administered 2015-11-29: 25 ug via INTRAVENOUS
  Administered 2015-11-29: 50 ug via INTRAVENOUS
  Administered 2015-11-29: 25 ug via INTRAVENOUS
  Administered 2015-11-29: 50 ug via INTRAVENOUS
  Administered 2015-11-29: 25 ug via INTRAVENOUS
  Administered 2015-11-29: 50 ug via INTRAVENOUS
  Administered 2015-11-29: 25 ug via INTRAVENOUS

## 2015-11-29 MED ORDER — ALBUMIN HUMAN 5 % IV SOLN
INTRAVENOUS | Status: DC | PRN
  Administered 2015-11-29: 21:00:00 via INTRAVENOUS

## 2015-11-29 MED ORDER — PROMETHAZINE HCL 25 MG/ML IJ SOLN
INTRAMUSCULAR | Status: AC
  Filled 2015-11-29: qty 1

## 2015-11-29 MED ORDER — THROMBIN 20000 UNITS EX SOLR
CUTANEOUS | Status: AC
  Filled 2015-11-29: qty 20000

## 2015-11-29 MED ORDER — MUPIROCIN 2 % EX OINT
TOPICAL_OINTMENT | CUTANEOUS | Status: AC
  Administered 2015-11-29: 16:00:00
  Filled 2015-11-29: qty 22

## 2015-11-29 MED ORDER — DEXAMETHASONE SODIUM PHOSPHATE 4 MG/ML IJ SOLN
INTRAMUSCULAR | Status: DC | PRN
  Administered 2015-11-29: 4 mg via INTRAVENOUS

## 2015-11-29 MED ORDER — SUCCINYLCHOLINE CHLORIDE 20 MG/ML IJ SOLN
INTRAMUSCULAR | Status: DC | PRN
  Administered 2015-11-29: 100 mg via INTRAVENOUS

## 2015-11-29 MED ORDER — IOHEXOL 300 MG/ML  SOLN
INTRAMUSCULAR | Status: DC | PRN
  Administered 2015-11-29 (×2): 50 mL via INTRAVENOUS

## 2015-11-29 MED ORDER — SODIUM CHLORIDE 0.9 % IV SOLN: Freq: Once | INTRAVENOUS | Status: DC

## 2015-11-29 MED ORDER — MIDAZOLAM HCL 2 MG/2ML IJ SOLN
INTRAMUSCULAR | Status: AC
  Filled 2015-11-29: qty 2

## 2015-11-29 MED ORDER — SODIUM CHLORIDE 0.9 % IV SOLN
INTRAVENOUS | Status: DC | PRN
  Administered 2015-11-29: 500 mL

## 2015-11-29 MED ORDER — 0.9 % SODIUM CHLORIDE (POUR BTL) OPTIME
TOPICAL | Status: DC | PRN
  Administered 2015-11-29: 2000 mL

## 2015-11-29 MED ORDER — HEMOSTATIC AGENTS (NO CHARGE) OPTIME
TOPICAL | Status: DC | PRN
  Administered 2015-11-29 (×2): 1 via TOPICAL

## 2015-11-29 SURGICAL SUPPLY — 72 items
AGENT HMST SPONGE THK3/8 (HEMOSTASIS) ×4
BANDAGE ELASTIC 4 VELCRO ST LF (GAUZE/BANDAGES/DRESSINGS) IMPLANT
BANDAGE ESMARK 6X9 LF (GAUZE/BANDAGES/DRESSINGS) IMPLANT
BLADE SURG 10 STRL SS (BLADE) ×1 IMPLANT
BNDG CMPR 9X6 STRL LF SNTH (GAUZE/BANDAGES/DRESSINGS)
BNDG ESMARK 6X9 LF (GAUZE/BANDAGES/DRESSINGS)
CANISTER SUCTION 2500CC (MISCELLANEOUS) ×3 IMPLANT
CATH EMB 2FR 60CM (CATHETERS) ×1 IMPLANT
CATH EMB 3FR 80CM (CATHETERS) ×3 IMPLANT
CATH EMB 4FR 80CM (CATHETERS) ×2 IMPLANT
CATH EMB 5FR 80CM (CATHETERS) ×1 IMPLANT
CLIP TI MEDIUM 24 (CLIP) ×3 IMPLANT
CLIP TI WIDE RED SMALL 24 (CLIP) ×3 IMPLANT
CONT SPEC 4OZ CLIKSEAL STRL BL (MISCELLANEOUS) ×1 IMPLANT
COVER PROBE W GEL 5X96 (DRAPES) ×3 IMPLANT
CUFF TOURNIQUET SINGLE 24IN (TOURNIQUET CUFF) IMPLANT
CUFF TOURNIQUET SINGLE 34IN LL (TOURNIQUET CUFF) IMPLANT
CUFF TOURNIQUET SINGLE 44IN (TOURNIQUET CUFF) IMPLANT
DRAIN CHANNEL 15F RND FF W/TCR (WOUND CARE) IMPLANT
DRAPE C-ARM 42X72 X-RAY (DRAPES) ×4 IMPLANT
DRSG COVADERM 4X10 (GAUZE/BANDAGES/DRESSINGS) ×1 IMPLANT
DRSG COVADERM 4X8 (GAUZE/BANDAGES/DRESSINGS) IMPLANT
ELECT REM PT RETURN 9FT ADLT (ELECTROSURGICAL) ×3
ELECTRODE REM PT RTRN 9FT ADLT (ELECTROSURGICAL) ×2 IMPLANT
EVACUATOR SILICONE 100CC (DRAIN) IMPLANT
GAUZE SPONGE 4X4 16PLY XRAY LF (GAUZE/BANDAGES/DRESSINGS) ×1 IMPLANT
GLOVE BIO SURGEON STRL SZ 6.5 (GLOVE) ×4 IMPLANT
GLOVE BIO SURGEON STRL SZ7 (GLOVE) ×5 IMPLANT
GLOVE BIOGEL PI IND STRL 6.5 (GLOVE) IMPLANT
GLOVE BIOGEL PI IND STRL 7.5 (GLOVE) ×2 IMPLANT
GLOVE BIOGEL PI INDICATOR 6.5 (GLOVE) ×2
GLOVE BIOGEL PI INDICATOR 7.5 (GLOVE) ×3
GLOVE ECLIPSE 6.5 STRL STRAW (GLOVE) ×3 IMPLANT
GOWN STRL REUS W/ TWL LRG LVL3 (GOWN DISPOSABLE) ×6 IMPLANT
GOWN STRL REUS W/TWL LRG LVL3 (GOWN DISPOSABLE) ×18
HEMOSTAT SPONGE AVITENE ULTRA (HEMOSTASIS) ×2 IMPLANT
INSERT FOGARTY SM (MISCELLANEOUS) IMPLANT
KIT BASIN OR (CUSTOM PROCEDURE TRAY) ×3 IMPLANT
KIT ROOM TURNOVER OR (KITS) ×3 IMPLANT
LIQUID BAND (GAUZE/BANDAGES/DRESSINGS) ×1 IMPLANT
MARKER GRAFT CORONARY BYPASS (MISCELLANEOUS) IMPLANT
MARKER SKIN DUAL TIP RULER LAB (MISCELLANEOUS) ×1 IMPLANT
NS IRRIG 1000ML POUR BTL (IV SOLUTION) ×6 IMPLANT
PACK PERIPHERAL VASCULAR (CUSTOM PROCEDURE TRAY) ×3 IMPLANT
PAD ARMBOARD 7.5X6 YLW CONV (MISCELLANEOUS) ×6 IMPLANT
PADDING CAST COTTON 6X4 STRL (CAST SUPPLIES) IMPLANT
SET COLLECT BLD 21X3/4 12 PB (MISCELLANEOUS) ×1 IMPLANT
SET MICROPUNCTURE 5F STIFF (MISCELLANEOUS) ×2 IMPLANT
STAPLER VISISTAT 35W (STAPLE) ×1 IMPLANT
STOPCOCK 4 WAY LG BORE MALE ST (IV SETS) ×1 IMPLANT
SUT ETHILON 3 0 PS 1 (SUTURE) IMPLANT
SUT GORETEX 5 0 TT13 24 (SUTURE) IMPLANT
SUT GORETEX 6.0 TT13 (SUTURE) IMPLANT
SUT MNCRL AB 4-0 PS2 18 (SUTURE) ×7 IMPLANT
SUT PROLENE 5 0 C 1 24 (SUTURE) ×4 IMPLANT
SUT PROLENE 6 0 BV (SUTURE) ×10 IMPLANT
SUT PROLENE 7 0 BV 1 (SUTURE) ×2 IMPLANT
SUT SILK 2 0 FS (SUTURE) IMPLANT
SUT SILK 3 0 (SUTURE) ×6
SUT SILK 3-0 18XBRD TIE 12 (SUTURE) IMPLANT
SUT VIC AB 2-0 CT1 27 (SUTURE) ×12
SUT VIC AB 2-0 CT1 TAPERPNT 27 (SUTURE) ×4 IMPLANT
SUT VIC AB 3-0 SH 27 (SUTURE) ×12
SUT VIC AB 3-0 SH 27X BRD (SUTURE) ×6 IMPLANT
SUT VICRYL 4-0 PS2 18IN ABS (SUTURE) ×3 IMPLANT
SYR 3ML LL SCALE MARK (SYRINGE) ×1 IMPLANT
SYR TB 1ML LUER SLIP (SYRINGE) ×1 IMPLANT
TRAY FOLEY W/METER SILVER 16FR (SET/KITS/TRAYS/PACK) ×2 IMPLANT
TUBING EXTENTION W/L.L. (IV SETS) ×1 IMPLANT
UNDERPAD 30X30 INCONTINENT (UNDERPADS AND DIAPERS) ×3 IMPLANT
WATER STERILE IRR 1000ML POUR (IV SOLUTION) ×3 IMPLANT
YANKAUER SUCT BULB TIP NO VENT (SUCTIONS) ×1 IMPLANT

## 2015-11-29 NOTE — ED Provider Notes (Signed)
CSN: [id number]     Arrival date & time 11/29/15  1027 History   First MD Initiated Contact with Patient 11/29/15 1048     Chief Complaint  Patient presents with  . Leg Pain  . Vaginal Bleeding     (Consider location/radiation/quality/duration/timing/severity/associated sxs/prior Treatment) HPI   Patient has a PMH of Basal Cell Carcinoma, GERD, Osetopenia, Thrombocytopenia comes to the ER with a positive lower extremity doppler showing clotting in her right poplateal artery with normal collateral arteries.  The test was ordered by her orthopedic physician. She reports developing vaginal bleeding 2 days ago with very small clots and 1-2 tablespoons of bleeding 4-5 times a day. She has not had vaginal bleeding in the past 14 years. She did not tell the doctor about it yesterday since he is an orthopedic provider. She has had a small amount of blood when she blows her nose which she describes as normal. None that she knows of anywhere else.  She describes decreased sensation to right leg with clot.  PCP: Garret Reddish, MD  ROS: The patient denies diaphoresis, fever, headache, weakness (general or focal), confusion, change of vision,  dysphagia, aphagia, shortness of breath,  abdominal pains, nausea, vomiting, diarrhea,, rash, neck pain, chest pain.   Past Medical History  Diagnosis Date  . Excess or deficiency of vitamin D   . Basal cell carcinoma   . Microhematuria   . GERD (gastroesophageal reflux disease)   . Osteopenia   . Atypical chest pain   . Thrombocytopenia ([place])   . Gallbladder polyp   . Anal fissure   . Hemorrhoids    Past Surgical History  Procedure Laterality Date  . Lung biopsy    . Colonoscopy  2005, 2015  . Upper gastrointestinal endoscopy  2009   Family History  Problem Relation Age of Onset  . COPD Mother   . Osteoporosis Mother   . Prostate cancer Father   . Heart disease Brother   . Lymphoma Brother     non and hodgkins  . Coronary artery disease  Brother   . Colon cancer Neg Hx   . Colon polyps Neg Hx   . Rectal cancer Neg Hx   . Stomach cancer Neg Hx    Social History  Substance Use Topics  . Smoking status: Never Smoker   . Smokeless tobacco: Never Used  . Alcohol Use: No   OB History    No data available     Review of Systems  Review of Systems All other systems negative except as documented in the HPI. All pertinent positives and negatives as reviewed in the HPI.   Allergies  Review of patient's allergies indicates no known allergies.  Home Medications   Prior to Admission medications   Medication Sig Start Date End Date Taking? Authorizing Provider  estradiol (ESTRACE VAGINAL) 0.1 MG/GM vaginal cream Place vaginally as directed.      Historical Provider, MD  gabapentin (NEURONTIN) 100 MG capsule Take 1 capsule (100 mg total) by mouth at bedtime. 11/20/15   Lyndal Pulley, DO  Misc Natural Products (FIBER 7 PO) Take by mouth.    Historical Provider, MD  omeprazole (PRILOSEC) 20 MG capsule TAKE ONE CAPSULE BY MOUTH TWICE DAILY 11/26/15   Marin Olp, MD  tretinoin (RETIN-A) 0.05 % cream Apply 1 application topically 4 (four) times a week.    Historical Provider, MD  Vitamin D, Ergocalciferol, (DRISDOL) 50000 units CAPS capsule Take 1 capsule (50,000 Units total) by mouth  every 7 (seven) days. 11/28/15   Lyndal Pulley, DO   BP 128/82 mmHg  Pulse 74  Temp(Src) 98.9 F (37.2 C) (Oral)  Ht 5\' 7"  (1.702 m)  Wt 67.586 kg  BMI 23.33 kg/m2  SpO2 99% Physical Exam  Constitutional: She appears well-developed and well-nourished. No distress.  HENT:  Head: Normocephalic and atraumatic.  Right Ear: Tympanic membrane and ear canal normal.  Left Ear: Tympanic membrane and ear canal normal.  Nose: Nose normal.  Mouth/Throat: Uvula is midline, oropharynx is clear and moist and mucous membranes are normal.  Eyes: Pupils are equal, round, and reactive to light.  Neck: Normal range of motion. Neck supple.   Cardiovascular: Normal rate and regular rhythm.   Pulmonary/Chest: Effort normal.  Abdominal: Soft.  No signs of abdominal distention  Genitourinary:  Bleeding within vaginal vault. No lesions or masses noted. No site of bleeding or clots.  Musculoskeletal:  Decreased sensation to right leg, femoral and pedal pulses palpable bilaterally with CR < 2 seconds in all 10 toes.  Neurological: She is alert.  Acting at baseline  Skin: Skin is warm and dry. No rash noted.  Nursing note and vitals reviewed.   ED Course  Procedures (including critical care time) Labs Review Labs Reviewed  WET PREP, GENITAL - Abnormal; Notable for the following:    WBC, Wet Prep HPF POC FEW (*)    All other components within normal limits  CBC WITH DIFFERENTIAL/PLATELET - Abnormal; Notable for the following:    Platelets 123 (*)    All other components within normal limits  BASIC METABOLIC PANEL - Abnormal; Notable for the following:    Glucose, Bld 101 (*)    All other components within normal limits  URINALYSIS, ROUTINE W REFLEX MICROSCOPIC (NOT AT [facility]) - Abnormal; Notable for the following:    Hgb urine dipstick LARGE (*)    All other components within normal limits  DIC (DISSEMINATED INTRAVASCULAR COAGULATION) PANEL - Abnormal; Notable for the following:    D-Dimer, Quant 0.86 (*)    Platelets 121 (*)    All other components within normal limits  URINE MICROSCOPIC-ADD ON - Abnormal; Notable for the following:    Squamous Epithelial / LPF 0-5 (*)    Bacteria, UA RARE (*)    All other components within normal limits  PROTIME-INR  TYPE AND SCREEN    Imaging Review No results found. I have personally reviewed and evaluated these images and lab results as part of my medical decision-making.   EKG Interpretation   Date/Time:  Friday November 29 2015 14:18:07 EST Ventricular Rate:  78 PR Interval:  147 QRS Duration: 82 QT Interval:  357 QTC Calculation: 407 R Axis:   36 Text  Interpretation:  Sinus rhythm Baseline wander in lead(s) V4 No  significant change was found Confirmed by Wyvonnia Dusky  MD, STEPHEN [phone]) on  11/29/2015 2:26:44 PM      MDM   Final diagnoses:  Right popliteal artery occlusion (HCC)  Vaginal bleeding    Discussed case with Vascular Physician Dr. Bridgett Larsson  at 12: 42 pm He feels that based on the results and my reports of physical exam findings that symptoms are likely chronic. However, someone will consult and see patient but it will be a while before someone is able to come due to number of consultations/staff pending. The labs for angiograms are closed due to the holidays and further work-up is not feasible at this time.  12: 30 pm- Dr. Bridgett Larsson has  seen patient and plans to take patient to surgery in approx 3 hours for the occlusion. He requests Gynecology consult be obtained regarding vaginal bleeding. At this time the two do not appear to be related.  3:29 pm- I spoke with Dr. Barnett Abu on call Gynecologist for [facility], the patient is hemodynamically stable and needs transvaginal US done for further evaluation for the first step. She recommends that on-call gynecologist be called with [place] results for further recommendations at that point regarding the vaginal bleeding. Pt likely to go to the OR for intervention prior to transvag US.  Delos Haring, PA-C 11/29/15 [place], MD 11/29/15 [phone]

## 2015-11-29 NOTE — Anesthesia Preprocedure Evaluation (Addendum)
Anesthesia Evaluation  Patient identified by MRN, date of birth, ID band Patient awake    Reviewed: Allergy & Precautions, NPO status , Patient's Chart, lab work & pertinent test results  Airway Mallampati: II  TM Distance: >3 FB Neck ROM: Full    Dental no notable dental hx.    Pulmonary neg pulmonary ROS,    Pulmonary exam normal breath sounds clear to auscultation       Cardiovascular Normal cardiovascular exam Rhythm:Regular Rate:Normal  The estimated LV ejection fraction was 60%. - Normal wall motion; no LV regional wall motion abnormalities.  Immediate post stress: Normal wall motion; no LV regional wall motion abnormalities.    Neuro/Psych negative neurological ROS  negative psych ROS   GI/Hepatic negative GI ROS, Neg liver ROS,   Endo/Other  negative endocrine ROS  Renal/GU negative Renal ROS  negative genitourinary   Musculoskeletal negative musculoskeletal ROS (+)   Abdominal   Peds negative pediatric ROS (+)  Hematology negative hematology ROS (+)   Anesthesia Other Findings   Reproductive/Obstetrics negative OB ROS                             Anesthesia Physical Anesthesia Plan  ASA: II and emergent  Anesthesia Plan: General   Post-op Pain Management:    Induction: Intravenous  Airway Management Planned: Oral ETT  Additional Equipment:   Intra-op Plan:   Post-operative Plan: Extubation in OR  Informed Consent: I have reviewed the patients History and Physical, chart, labs and discussed the procedure including the risks, benefits and alternatives for the proposed anesthesia with the patient or authorized representative who has indicated his/her understanding and acceptance.   Dental advisory given  Plan Discussed with: CRNA and Surgeon  Anesthesia Plan Comments:        Anesthesia Quick Evaluation

## 2015-11-29 NOTE — Transfer of Care (Signed)
Immediate Anesthesia Transfer of Care Note  Patient: Elizabeth Douglas  Procedure(s) Performed: Procedure(s):  RIGHT  POPLITEAL to anterior tibial and posterior tibial ARTERY thrombectomy. Right above knee to below knee popliteal artery bypass (Right) INTRA OPERATIVE ARTERIOGRAM (Right)  Patient Location: PACU  Anesthesia Type:General  Level of Consciousness: awake and patient cooperative  Airway & Oxygen Therapy: Patient Spontanous Breathing and Patient connected to nasal cannula oxygen  Post-op Assessment: Report given to RN and Post -op Vital signs reviewed and stable  Post vital signs: Reviewed and stable  Last Vitals:  Filed Vitals:   11/29/15 1430 11/29/15 2310  BP: 120/72   Pulse: 71   Temp:  36.9 C  Resp: 18     Complications: No apparent anesthesia complications

## 2015-11-29 NOTE — ED Notes (Signed)
Pt was seen at vascular today and shows a positive arterial clot in back of right knee. Pt also endorses vaginal bleeding for approx 3 days. Pt has been post menopausal x14 years. Pt states she is passing clots and changing pads about 4-5 times a day. Pt also endorses left sided lower abdominal pain.

## 2015-11-29 NOTE — Consult Note (Signed)
Hospital Consult    Reason for Consult:  Right popliteal arterial thrombus  Referring Physician:  ED  MRN #:  [id number]  History of Present Illness: This is a 64 y.o. female who states that on November 13, 2015, she was having pain in her left hip and went to see Dr. Tamala Julian and at that time, he injected her left hip, which relieved her symptoms.  On 11/18/15, she woke up with her right foot numb.  She states that she is having a hard time getting around, but before this event, she would walk 4-5 miles 4-5x/week.   This was sudden in onset and she had not had this in the past.  She states that she went back to Dr. Tamala Julian and was given 2 injections, which have not relieved her pain or numbness.  She states that she was then sent for the ultrasound of her right leg and was told she had an arterial clot and was sent to the emergency room.  She states that she feels there is a color difference in her legs and the right one turns white on the bottom after walking.  She states it is worse to stand on it.  She states that she has a broken blood vessel on the bottom of her right foot.  She can still move her toes and foot.  She states that she does have decreased sensation in the right foot.  She denies any history of irregular heartbeat.  She states that she stopped drinking caffine 3 years ago and her heart fluttering did get better.  She states that she rarely feels this now.  She states that her mother was on blood thinners for hardening of the arteries, but did not know anymore than that.  She states that she has been having vaginal bleeding for a few days and does have an appointment with the GYN, however, she could not be seen until next week.  She states that it has gotten heavier.  She does not smoke and never has smoked.  She states that she is pretty healthy and the past couple of weeks have gotten to her mentally.   Past Medical History  Diagnosis Date  . Excess or deficiency of vitamin D    . Basal cell carcinoma   . Microhematuria   . GERD (gastroesophageal reflux disease)   . Osteopenia   . Atypical chest pain   . Thrombocytopenia ([place])   . Gallbladder polyp   . Anal fissure   . Hemorrhoids     Past Surgical History  Procedure Laterality Date  . Lung biopsy    . Colonoscopy  2005, 2015  . Upper gastrointestinal endoscopy  2009    No Known Allergies  Prior to Admission medications   Medication Sig Start Date End Date Taking? Authorizing Provider  estradiol (ESTRACE VAGINAL) 0.1 MG/GM vaginal cream Place vaginally as directed.      Historical Provider, MD  gabapentin (NEURONTIN) 100 MG capsule Take 1 capsule (100 mg total) by mouth at bedtime. 11/20/15   Lyndal Pulley, DO  Misc Natural Products (FIBER 7 PO) Take by mouth.    Historical Provider, MD  omeprazole (PRILOSEC) 20 MG capsule TAKE ONE CAPSULE BY MOUTH TWICE DAILY 11/26/15   Marin Olp, MD  tretinoin (RETIN-A) 0.05 % cream Apply 1 application topically 4 (four) times a week.    Historical Provider, MD  Vitamin D, Ergocalciferol, (DRISDOL) 50000 units CAPS capsule Take 1 capsule (50,000 Units total) by  mouth every 7 (seven) days. 11/28/15   Lyndal Pulley, DO    Social History   Social History  . Marital Status: Married    Spouse Name: N/A  . Number of Children: 1  . Years of Education: N/A   Occupational History  . Retired    Social History Main Topics  . Smoking status: Never Smoker   . Smokeless tobacco: Never Used  . Alcohol Use: No  . Drug Use: No  . Sexual Activity: Not on file   Other Topics Concern  . Not on file   Social History Narrative     Family History  Problem Relation Age of Onset  . COPD Mother   . Osteoporosis Mother   . Prostate cancer Father   . Heart disease Brother   . Lymphoma Brother     non and hodgkins  . Coronary artery disease Brother   . Colon cancer Neg Hx   . Colon polyps Neg Hx   . Rectal cancer Neg Hx   . Stomach cancer Neg Hx      ROS: [x]  Positive   [ ]  Negative   [ ]  All sytems reviewed and are negative  Cardiovascular: []  chest pain/pressure []  palpitations []  SOB lying flat []  DOE [x]  pain in legs while walking [x]  sudden numbness in right lower leg 11/18/15 []  pain in legs at rest []  pain in legs at night []  non-healing ulcers []  hx of DVT []  swelling in legs  Pulmonary: []  productive cough []  asthma/wheezing []  home O2  Neurologic: []  weakness in []  arms []  legs [x]  numbness in []  arms [x]  right leg []  hx of CVA []  mini stroke [] difficulty speaking or slurred speech []  temporary loss of vision in one eye []  dizziness  Hematologic: []  hx of cancer [x]  bleeding problems-recently started having vaginal bleeding []  problems with blood clotting easily  Endocrine:   []  diabetes []  thyroid disease  GI []  vomiting blood []  blood in stool  GU: []  CKD/renal failure []  HD--[]  M/W/F or []  T/T/S []  burning with urination []  blood in urine  Psychiatric: []  anxiety []  depression  Musculoskeletal: []  arthritis []  joint pain  Integumentary: []  rashes []  ulcers  Constitutional: []  fever []  chills   Physical Examination  Filed Vitals:   11/29/15 1049 11/29/15 1100  BP:  128/82  Pulse:  74  Temp: 98.9 F (37.2 C)    Body mass index is 23.33 kg/(m^2).  General:  WDWN in NAD Gait: Not observed HENT: WNL, normocephalic Pulmonary: normal non-labored breathing, without Rales, rhonchi,  wheezing Cardiac: regular, without  Murmurs, rubs or gallops; without carotid bruits Abdomen:  soft, NT/ND, no masses Skin: without rashes Vascular Exam/Pulses:  Right Left  Radial 2+ (normal) 2+ (normal)  Ulnar 2+ (normal) 2+ (normal)  Femoral 2+ (normal) 2+ (normal)  Popliteal Unable to palpate  Unable to palpate   DP Unable to palpate  2+ (normal)  PT Unable to palpate  2+ (normal)   Extremities: without ischemic changes, without Gangrene , without cellulitis; without open wounds;   Musculoskeletal: no muscle wasting or atrophy  Neurologic: A&O X 3; Appropriate Affect ; SENSATION: normal; MOTOR FUNCTION:  moving all extremities equally. Speech is fluent/normal Psychiatric:  Capable of medical decision making Lymph:  No inguinal lymphadenopathy   CBC    Component Value Date/Time   WBC 6.2 11/29/2015 1130   WBC 4.7 10/16/2015   WBC 3.9 12/08/2006 1042   RBC 4.27 11/29/2015 1130   RBC  4.43 12/08/2006 1042   HGB 13.4 11/29/2015 1130   HGB 14.4 12/08/2006 1042   HCT 41.0 11/29/2015 1130   HCT 41.7 12/08/2006 1042   PLT 121* 11/29/2015 1131   PLT 170 12/08/2006 1042   MCV 96.0 11/29/2015 1130   MCV 94.0 12/08/2006 1042   MCH 31.4 11/29/2015 1130   MCH 32.5 12/08/2006 1042   MCHC 32.7 11/29/2015 1130   MCHC 34.6 12/08/2006 1042   RDW 12.6 11/29/2015 1130   RDW 12.5 12/08/2006 1042   LYMPHSABS 1.4 11/29/2015 1130   LYMPHSABS 1.5 12/08/2006 1042   MONOABS 0.4 11/29/2015 1130   MONOABS 0.2 12/08/2006 1042   EOSABS 0.1 11/29/2015 1130   EOSABS 0.1 12/08/2006 1042   BASOSABS 0.0 11/29/2015 1130   BASOSABS 0.0 12/08/2006 1042    BMET    Component Value Date/Time   NA 141 11/29/2015 1130   NA 144 10/16/2015   K 4.1 11/29/2015 1130   CL 105 11/29/2015 1130   CO2 27 11/29/2015 1130   GLUCOSE 101* 11/29/2015 1130   GLUCOSE 79 11/19/2006 1355   BUN 11 11/29/2015 1130   BUN 12 10/16/2015   CREATININE 0.71 11/29/2015 1130   CREATININE 0.6 10/16/2015   CALCIUM 9.1 11/29/2015 1130   GFRNONAA >60 11/29/2015 1130   GFRAA >60 11/29/2015 1130    COAGS: Lab Results  Component Value Date   INR 1.00 11/29/2015   INR 0.97 11/29/2015   INR 1.0 RATIO 01/10/2009     Non-Invasive Vascular Imaging:   ABI's 11/29/15: Right:  0.66 --right CFA waveform is brisk and triphasic --right popliteal, peroneal, AT and PT artery waveforms are monophasic Left:  1.3 --Left CFA, popliteal, peroneal, AT and PT waveforms are brisk and triphasic  TBI's 11/29/15: Right:   0.37 Left:  0.49  Arterial duplex 11/29/15: Right popliteal artery occlusion with intraluminal thrombus, reconstitution at the tibial-peroneal trunk.  Statin:  No. Beta Blocker:  No. Aspirin:  No. ACEI:  No. ARB:  No. Other antiplatelets/anticoagulants:  No.    ASSESSMENT/PLAN: This is a 64 y.o. female with sudden onset of RLE numbness last Monday, November 18, 2015 with arterial duplex today revealing right popliteal artery occlusion with intraluminal thrombus, with reconstitution at the tibial-peroneal trunk and 3 vessel runoff and has recently new vaginal bleeding  -She denies any hx of irregular heart beat-I have ordered an EKG.  She will most likely need a cardiac workup with echo -her sx of this thrombus started suddenly last Monday 11/18/15.  Her sensation and motor continue to be in tact and her foot is viable, however, she is having trouble walking as she does have pain in the right leg.  (prior to this event, she was walking 4-5 miles 4-5x/week) -she has recently started having vaginal bleeding, but may need to be started on heparin-will d/w Dr. Bridgett Larsson -may need GYN consult -she has not eaten today in case she needs to go to the [place], PA-C [facility] [phone]  Addendum  I have independently interviewed and examined the patient, and I agree with the physician assistant's findings.   Pt's history is sx free walk prior to ~2 weeks ago, well documented in her outpatient Sports Medicine chart.   Pt notes marked difference in right leg sx with right plantar surface anesthesia with ambulation and claudication in the calf with ambulation not.  The left leg waveforms are completely normal and markedly different from her right side.  This suggests to me  thromboembolism to right leg two weeks ago.  It's not clear to me why her sx are so benign despite having an acute popliteal occlusion.  She continues to have perfusion to her 3 tibial arteries, so I  doubt compartment syndrome can be expected post-op.    - I recommended: Right popliteal thromboembolectomy via calf approach. - The risk, benefits, and alternative for the operation were discussed with the patient.  The patient is aware the risks include but are not limited to: bleeding, infection, myocardial infarction, stroke, limb loss, nerve damage, need for additional procedures in the future, wound complications, and inability to completely extract all thrombus. - The patient is aware of these risks and agreed to proceed. - Will likely need GYN to re-evaluate the patient post-op - Will need embolic work-up post-op: TTE, CTA chest/abd/pelvis with runoff  [name], MD [facility] [place] Office: [phone] Pager: [phone]  11/29/2015, 3:01 PM

## 2015-11-29 NOTE — Anesthesia Procedure Notes (Signed)

## 2015-11-29 NOTE — Op Note (Signed)
OPERATIVE NOTE   PROCEDURE: 1. Right popliteal thrombectomy 2. Right posterior tibial artery thrombectomy  3. Right anterior tibial artery thrombectomy  4. Right above-the-knee to below-the-knee popliteal bypass with ipsilateral non-reverse greater saphenous vein  5. Open cannulation of right common femoral artery  6. Right leg runoff  PRE-OPERATIVE DIAGNOSIS: popliteal artery sub-acute occlusion  POST-OPERATIVE DIAGNOSIS: same as above   SURGEON: [name], MD  ASSISTANT(S): Leontine Locket, PAC   ANESTHESIA: general  ESTIMATED BLOOD LOSS: 1000 cc  FINDING(S): 1.  Old organized thrombus in popliteal artery, posterior tibial artery and anterior tibial artery  2.  Chronically occluded popliteal artery with heavy calcification 3.  Mild calcification of above-the-knee and below-the-knee popliteal artery 4.  Palpable anterior tibial artery at end of case with dopplerable posterior tibial artery  5.  Soft calf with no evidence of bulging muscle  SPECIMEN(S):  Popliteal artery thrombus  INDICATIONS:   Elizabeth Douglas is a 64 y.o. female who presents with sub-acute occlusion of right popliteal artery.  Patient had anesthesia in her right foot with short distance walking and new findings consistent with thrombus in popliteal artery.  Given the definite onset <2 weeks, I had concerns that this patient had a sub-acute embolic event that would result in tibial embolism without immediate intervention.  I recommended: right leg thrombectomy and other indicated procedure.  The risk, benefits, and alternative for the operation were discussed with the patient.  The patient is aware the risks include but are not limited to: bleeding, infection, myocardial infarction, stroke, limb loss, nerve damage, need for additional procedures in the future, wound complications, and inability to complete the bypass.  The patient is aware of these risks and agreed to proceed.   DESCRIPTION: After obtaining  full informed written consent, the patient was brought back to the operating room and placed supine upon the operating table.  The patient received IV antibiotics prior to induction.  After obtaining adequate anesthesia, the patient was prepped and draped in the standard fashion for: right leg bypass.  I turned my attention to the calf.  I made an incision one finger-width posterior to the tibia.  I dissected through superficial and deep fascia with electrocautery to gain exposure of the popliteal artery.  I retracted the medial popliteal vein out of the way and then dissected out the entire below-the-knee popliteal artery.  There was no doppler signal in this artery.  The patient was given 7000 units of Heparin intravenously, which was a therapeutic bolus. An additional 2000 units of Heparin was administered every hour after initial bolus to maintain anticoagulation.    After waiting 3 minutes, I made an transverse arteriotomy proximal to the trifurcation.  I passed a 3 and 4 Fogarty catheter proximally extracting old organized thrombus.  I could not cross the popliteal artery at the knee level, however.  I turned my attention to the right groin.  I made an oblique incision and dissected out the common femoral artery down to the femoral bifurcation with electrocautery.  I clamped the proximal common femoral artery and the profunda femoral artery.  I made a transverse arteriotomy with a 11-blade and extended it with a Potts scissor.  There was minimal backbleeding from the superficial femoral artery.  I passed a 3, 4 and 5 Fogarty distally into the superficial femoral artery.  I was able to extract old organized thrombus.  I was able to cross the popliteal artery at the knee once, but the 4 Fogarty balloon  ruptured on dense resistance.  I was never able to cross this segment again.    At this point, I repaired the femoral arteriotomy with a running stitch of 6-0 Prolene.  I also repaired the popliteal artery  with 6-0 Prolene.  There was minimal doppler signal in the below-the-knee popliteal artery, so I felt an on-the-table angiogram was necessary.  Attention was turned to the right groin.  The common femoral artery was then cannulated with a micropuncture needle.  The microwire was advanced into the iliac arterial system.  The needle was exchanged for a microsheath, which was loaded into the common femoral artery over the wire.  The sheath was connected to IV extension tubing.  I then completed the right leg runoff at the level of the knee via hand injection.  This demonstrated occlusion of the popliteal artery at the knee for ~3 cm.  Based on the images, I felt an above-the-knee to below-the-knee popliteal artery bypass was going to be necessary.  I placed a Z-stitch around the sheath with 6-0 prolene and then tied the stitch as the sheath was removed.  I identified the greater saphenous vein in thigh under Sonosite guidance and marked its location.  I made a single incision over the vein and then dissected out the vein.  Ligating and clipping side branches in the process.  This vein was found to good quality with excellent caliber.  Once I verified I had adequate length, I clamped the distal end and transected the vein.  I tied off the distal vein with 2-0 silk.  I then clamped the saphenofemoral junction and transected the greater saphenous vein, leaving a vein cuff.  I oversewed the cuff with a double layer of 5-0 Prolene.  I soaked the vein in heparinized saline.  I turned my attention to the distal thigh, I made an incision over Hunter's canal.  I dissected through the underlying fascial layer and between muscle groups with electrocautery.  I was able to obtain exposure of the above the knee popliteal artery.  There was an excellent pulse here.  I decided to make one last attempt at a thrombectomy.  I clamped the distal superficial femoral artery and made a longitudinal arteriotomy in the popliteal artery.  I  passed a 5 Fogarty through the artery with some difficulty.  I ruptured the 5 Fogarty in the process of try to complete the thrombectomy.  At this point, there was sudden onset of bleeding from the popliteal space.  After evacuating the bleeding, I discovered the Fogarty had tore a portion of the below-the-knee popliteal artery.  I clamped the popliteal artery proximally in the popliteal exposure.  As 2 cm of the below-the-knee popliteal artery was damaged, I placed on ligating the proximal below-the-knee popliteal artery and ending the vein conduit directly on the undamaged distal popliteal artery.    I test the vein conduit by hydrodistending the vein conduit.  It was excellent in size and quality.  I every the proximal hood and lysed sharply valves.  I spatulated this vein hood to meet the dimensions of the arteriotomy.  The vein conduit was sewn to the above-the-knee popliteal artery in an end-to-side fashion with a running stitch of 6-0 Prolene to preserve geniculate branches.  I then clamped the distally conduit.  I doubly ligated the proximal below-the-knee popliteal artery with 2-0 silk.  I lysed a single set of valves with a valvotome.  There was excellent pulsatile bleeding in this vein conduit.  I  marked the medial side of the conduit to maintain orientation.  I clamped the vein conduit just distal to the anastomosis.  I repaired a few areas of the vein with 6-0 Prolene stiches, likely side branches not appreciated earlier.  At this point, I dissected between the femoral condyles and passed an umbilical tapes which I tied to the distal conduit.  I pulled the umbilical tape with the conduit, maintaining orientation.  I spatulated the distal below-the-knee popliteal artery.  I then sharply adjusted the length of the conduit and spatulated the end of the vein conduit.  The vein was sewn to the below-the-knee popliteal artery in an end-to-end fashion with 6-0 Prolene.  Prior to completing this anastomosis,  I passed a 3 Fogarty distally and obtained some thrombus.  Based on the angle, I suspect the posterior tibial artery was being thrombectomized.  On subsequent passes, I got no further thrombus.  I completed this anastomosis in the usual fashion.  There was no active bleeding.  Distally there was posterior tibial artery signal but no anterior tibial artery signal, so I felt a completion angiogram was needed.  Attention was turned to the right groin.  The common femoral artery was then cannulated with a micropuncture needle.  The microwire was advanced into the iliac arterial system.  The needle was exchanged for a microsheath, which was loaded into the common femoral artery over the wire.  The sheath was connected to IV extension tubing.  I then completed the right leg runoff down to the level of the ankle via hand injection.  This demonstrated successful bypass of the popliteal artery occlusion with good flow into the below-the-knee popliteal artery with run-off via a patent posterior tibial artery with diseased appearing peroneal artery.  There appeared to be extensive thrombus in the anterior tibial artery.  Given this patient young age, I felt that attempting to salvage the anterior tibial artery would be advantageous.  Again I sewed a Z-stitch around the sheath and removed it.  I made a longitudinal incision over the anterior tibial artery just proximal to the ankle.  I dissected through the fascia with electrocautery and then blunt separated the tendon down to the level of the neurovascular bundle.  I dissected out a segment of the anterior tibial artery.  There was no flow in this artery but no obvious chronic disease.  I made a transverse arteriotomy.  I passed a 2 Fogarty proximally and was able to extract all of the thrombus after multiple passes.  This thrombus was again old organized thrombus.  I had return of pulsatile bleeding.  I passed the 2 Fogarty distally, only getting 2 cm distally.  I was  able to extract only a limited amount of thrombus.  There was return of distal backbleeding at this point.  I repaired this anterior tibial artery with a running stitch of 7-0 Prolene.  At this point, there was dopplerable posterior tibial artery and palpable anterior tibial artery.  All incisions were washed out and bleeding points controlled with electrocautery.  Avitene was left on all raw surface.  After waiting a few minutes, no further active bleeding was occurring.  I turned my attention first to the vein harvest site.  The vein had been harvest subfascial and was >2 cm deep.  I sewed a double of 2-0 Vicryl to obliterate this space.  The skin was reapproximated with staples.  A coverderm was applied to the incision after cleaning the skin.  The right groin was  repaired with a double layer of 2-0 Vicryl, a double layer of 3-0 Vicryl, and a 4-0 layer of Vicryl in subcuticular fashion.  The skin was cleaned, dried, and reinforced with Dermabond.  The above-the-knee popliteal exposure was reapproximated with a layer of 2-0 Vicryl and 3-0 Vicryl in the subcuticular tissue and a running subcuticular stitch of 4-0 Vicryl in the skin.  The skin was cleaned, dried, and reinforced with Dermabond.  The below-the-knee popliteal exposure was reapproximated with a layer of 2-0 Vicryl and 3-0 Vicryl in the subcuticular tissue and a running subcuticular stitch of 4-0 Vicryl in the skin.  The skin was cleaned, dried, and reinforced with Dermabond.  The anterior tibial artery exposure reapproximated with a layer of 3-0 Vicryl in the subcutaneous tissue.  The skin was reapproximated with a running subcuticular stitch of 4-0 Monocryl.  The skin was cleaned, dried, and reinforced with Dermabond.     COMPLICATIONS: none  CONDITION: stable   [name], MD [facility] Office: [phone] Pager: [phone]  11/29/2015, 11:38 PM

## 2015-11-30 ENCOUNTER — Inpatient Hospital Stay (HOSPITAL_COMMUNITY): Payer: BLUE CROSS/BLUE SHIELD

## 2015-11-30 LAB — CBC
HEMATOCRIT: 37 % (ref 36.0–46.0)
Hemoglobin: 12.4 g/dL (ref 12.0–15.0)
MCH: 31.5 pg (ref 26.0–34.0)
MCHC: 33.5 g/dL (ref 30.0–36.0)
MCV: 93.9 fL (ref 78.0–100.0)
Platelets: 114 10*3/uL — ABNORMAL LOW (ref 150–400)
RBC: 3.94 MIL/uL (ref 3.87–5.11)
RDW: 13.9 % (ref 11.5–15.5)
WBC: 10.2 10*3/uL (ref 4.0–10.5)

## 2015-11-30 LAB — BASIC METABOLIC PANEL
Anion gap: 7 (ref 5–15)
BUN: 11 mg/dL (ref 6–20)
CHLORIDE: 109 mmol/L (ref 101–111)
CO2: 26 mmol/L (ref 22–32)
Calcium: 7.9 mg/dL — ABNORMAL LOW (ref 8.9–10.3)
Creatinine, Ser: 0.76 mg/dL (ref 0.44–1.00)
GFR calc Af Amer: >60 mL/min (ref >60)
GFR calc non Af Amer: >60 mL/min (ref >60)
GLUCOSE: 143 mg/dL — AB (ref 65–99)
POTASSIUM: 4 mmol/L (ref 3.5–5.1)
Sodium: 142 mmol/L (ref 135–145)

## 2015-11-30 LAB — HEPARIN LEVEL (UNFRACTIONATED)
Heparin Unfractionated: 0.28 IU/mL — ABNORMAL LOW (ref 0.30–0.70)
Heparin Unfractionated: <0.1 IU/mL — ABNORMAL LOW (ref 0.30–0.70)

## 2015-11-30 LAB — APTT: aPTT: 30 seconds (ref 24–37)

## 2015-11-30 MED ORDER — ONDANSETRON HCL 4 MG/2ML IJ SOLN
4.0000 mg | Freq: Four times a day (QID) | INTRAMUSCULAR | Status: DC | PRN
  Administered 2015-11-30: 4 mg via INTRAVENOUS
  Filled 2015-11-30: qty 2

## 2015-11-30 MED ORDER — DEXTROSE 5 % IV SOLN
1.5000 g | Freq: Two times a day (BID) | INTRAVENOUS | Status: AC
  Administered 2015-11-30 (×2): 1.5 g via INTRAVENOUS
  Filled 2015-11-30 (×2): qty 1.5

## 2015-11-30 MED ORDER — GUAIFENESIN-DM 100-10 MG/5ML PO SYRP: 15.0000 mL | ORAL_SOLUTION | ORAL | Status: DC | PRN

## 2015-11-30 MED ORDER — SODIUM CHLORIDE 0.9 % IV SOLN
500.0000 mL | Freq: Once | INTRAVENOUS | Status: AC | PRN
  Administered 2015-11-30: 500 mL via INTRAVENOUS

## 2015-11-30 MED ORDER — ACETAMINOPHEN 325 MG PO TABS
325.0000 mg | ORAL_TABLET | ORAL | Status: DC | PRN
  Administered 2015-11-30 – 2015-12-03 (×10): 650 mg via ORAL
  Filled 2015-11-30 (×12): qty 2

## 2015-11-30 MED ORDER — ACETAMINOPHEN 650 MG RE SUPP: 325.0000 mg | RECTAL | Status: DC | PRN

## 2015-11-30 MED ORDER — PANTOPRAZOLE SODIUM 40 MG PO TBEC
40.0000 mg | DELAYED_RELEASE_TABLET | Freq: Every day | ORAL | Status: DC
Start: 2015-11-30 — End: 2015-12-03
  Administered 2015-11-30 – 2015-12-03 (×4): 40 mg via ORAL
  Filled 2015-11-30 (×4): qty 1

## 2015-11-30 MED ORDER — HYDRALAZINE HCL 20 MG/ML IJ SOLN: 5.0000 mg | INTRAMUSCULAR | Status: DC | PRN

## 2015-11-30 MED ORDER — BENEFIBER PO POWD: 1.0000 | Freq: Every day | ORAL | Status: DC

## 2015-11-30 MED ORDER — LABETALOL HCL 5 MG/ML IV SOLN: 10.0000 mg | INTRAVENOUS | Status: DC | PRN

## 2015-11-30 MED ORDER — MAGNESIUM HYDROXIDE 400 MG/5ML PO SUSP
30.0000 mL | Freq: Every day | ORAL | Status: DC | PRN
  Administered 2015-12-02: 30 mL via ORAL
  Filled 2015-11-30: qty 30

## 2015-11-30 MED ORDER — GABAPENTIN 100 MG PO CAPS
100.0000 mg | ORAL_CAPSULE | Freq: Every day | ORAL | Status: DC
  Filled 2015-11-30 (×2): qty 1

## 2015-11-30 MED ORDER — POTASSIUM CHLORIDE CRYS ER 20 MEQ PO TBCR: 20.0000 meq | EXTENDED_RELEASE_TABLET | Freq: Every day | ORAL | Status: DC | PRN

## 2015-11-30 MED ORDER — METOPROLOL TARTRATE 1 MG/ML IV SOLN: 2.0000 mg | INTRAVENOUS | Status: DC | PRN

## 2015-11-30 MED ORDER — MORPHINE SULFATE (PF) 2 MG/ML IV SOLN
2.0000 mg | INTRAVENOUS | Status: DC | PRN
  Administered 2015-11-30: 2 mg via INTRAVENOUS
  Filled 2015-11-30: qty 1

## 2015-11-30 MED ORDER — BISACODYL 10 MG RE SUPP: 10.0000 mg | Freq: Every day | RECTAL | Status: DC | PRN

## 2015-11-30 MED ORDER — OXYCODONE HCL 5 MG PO TABS: 5.0000 mg | ORAL_TABLET | ORAL | Status: DC | PRN

## 2015-11-30 MED ORDER — DOCUSATE SODIUM 100 MG PO CAPS
100.0000 mg | ORAL_CAPSULE | Freq: Every day | ORAL | Status: DC
  Administered 2015-11-30 – 2015-12-03 (×4): 100 mg via ORAL
  Filled 2015-11-30 (×4): qty 1

## 2015-11-30 MED ORDER — ALUM & MAG HYDROXIDE-SIMETH 200-200-20 MG/5ML PO SUSP: 15.0000 mL | ORAL | Status: DC | PRN

## 2015-11-30 MED ORDER — PRO-STAT SUGAR FREE PO LIQD
30.0000 mL | Freq: Every day | ORAL | Status: DC
  Administered 2015-11-30 – 2015-12-03 (×4): 30 mL via ORAL
  Filled 2015-11-30 (×4): qty 30

## 2015-11-30 MED ORDER — SODIUM CHLORIDE 0.9 % IV SOLN
INTRAVENOUS | Status: DC
  Administered 2015-11-30: 05:00:00 via INTRAVENOUS

## 2015-11-30 MED ORDER — PROMETHAZINE HCL 25 MG/ML IJ SOLN
12.5000 mg | Freq: Once | INTRAMUSCULAR | Status: AC
  Administered 2015-11-30: 12.5 mg via INTRAVENOUS
  Filled 2015-11-30: qty 1

## 2015-11-30 MED ORDER — HEPARIN (PORCINE) IN NACL 100-0.45 UNIT/ML-% IJ SOLN
850.0000 [IU]/h | INTRAMUSCULAR | Status: DC
  Administered 2015-11-30: 400 [IU]/h via INTRAVENOUS
  Filled 2015-11-30: qty 250

## 2015-11-30 MED ORDER — PHENOL 1.4 % MT LIQD: 1.0000 | OROMUCOSAL | Status: DC | PRN

## 2015-11-30 MED ORDER — VITAMIN D (ERGOCALCIFEROL) 1.25 MG (50000 UNIT) PO CAPS
50000.0000 [IU] | ORAL_CAPSULE | ORAL | Status: DC
  Administered 2015-11-30: 50000 [IU] via ORAL
  Filled 2015-11-30: qty 1

## 2015-11-30 NOTE — Progress Notes (Addendum)
   Daily Progress Note  Assessment/Planning: POD #1 s/p TE R pop, PT and AT arteries; R pop-pop bypass, R leg runoff for: thromboembolism popliteal artery and tibial arteries   Marginal BP overnight - continue resuscitation  Start therapeutic dose heparin without bolus to help thrombolysis of residual tibial thrombus  H/H ok despite 1 L EBL s/p 2 u pRBC transfusion overnight  Finished thromboembolism work-up: Echo, CTA Chest/abd/pelvis  Additionally medical issues include vaginal bleeding  GYN consult  Subjective  - 1 Day Post-Op  Pain ok  Objective Filed Vitals:   11/30/15 0226 11/30/15 0333 11/30/15 0424 11/30/15 0800  BP: 92/50 82/55 94/53  90/54  Pulse:  69  78  Temp:  98.4 F (36.9 C)    TempSrc:  Oral    Resp:  25  13  Height:      Weight:      SpO2:  100%  98%    Intake/Output Summary (Last 24 hours) at 11/30/15 0914 Last data filed at 11/30/15 0800  Gross per 24 hour  Intake   4023 ml  Output   1250 ml  Net   2773 ml    PULM  BLL rales CV  RRR GI  soft, NTND VASC  Inc c/d/i, dopplerable PT and DP   Laboratory CBC    Component Value Date/Time   WBC 10.2 11/30/2015 0545   WBC 4.7 10/16/2015   WBC 3.9 12/08/2006 1042   HGB 12.4 11/30/2015 0545   HGB 14.4 12/08/2006 1042   HCT 37.0 11/30/2015 0545   HCT 41.7 12/08/2006 1042   PLT PENDING 11/30/2015 0545   PLT 170 12/08/2006 1042    BMET    Component Value Date/Time   NA 142 11/30/2015 0545   NA 144 10/16/2015   K 4.0 11/30/2015 0545   CL 109 11/30/2015 0545   CO2 26 11/30/2015 0545   GLUCOSE 143* 11/30/2015 0545   GLUCOSE 79 11/19/2006 1355   BUN 11 11/30/2015 0545   BUN 12 10/16/2015   CREATININE 0.76 11/30/2015 0545   CREATININE 0.6 10/16/2015   CALCIUM 7.9* 11/30/2015 0545   GFRNONAA >60 11/30/2015 0545   GFRAA >60 11/30/2015 0545    [name], MD [facility]: [phone] Pager: [phone]  11/30/2015, 9:14 AM   ADDENDUM:   Spoke with Dr. Garwin Brothers of [facility] (pt's GYN is Dr. Benjie Karvonen of this group).  He was asked to see pt as she has started having vaginal bleeding over the past week and is now on full dose heparin. He said he would not see her in the hospital and that she should keep her appointment for December 10, 2015 as her treatment would not change.  Leontine Locket 11/30/2015 10:16 AM

## 2015-11-30 NOTE — Anesthesia Postprocedure Evaluation (Signed)
Anesthesia Post Note  Patient: Elizabeth Douglas  Procedure(s) Performed: Procedure(s) (LRB):  RIGHT  POPLITEAL to anterior tibial and posterior tibial ARTERY thrombectomy. Right above knee to below knee popliteal artery bypass (Right) INTRA OPERATIVE ARTERIOGRAM (Right)  Patient location during evaluation: PACU Anesthesia Type: General Level of consciousness: awake and alert Pain management: pain level controlled Vital Signs Assessment: post-procedure vital signs reviewed and stable Respiratory status: spontaneous breathing Cardiovascular status: blood pressure returned to baseline Anesthetic complications: no    Last Vitals:  Filed Vitals:   11/30/15 0333 11/30/15 0424  BP: 82/55 94/53  Pulse: 69   Temp: 36.9 C   Resp: 25     Last Pain:  Filed Vitals:   11/30/15 0501  PainSc: [place], Erhard Senske E

## 2015-11-30 NOTE — Evaluation (Signed)
Physical Therapy Evaluation Patient Details Name: Elizabeth Douglas MRN: [id number] DOB: 1951/05/16 Today's Date: 11/30/2015   History of Present Illness  Pt is a 64 y/o F s/p Rt popliteal to anterior tibial and posterior tibial artery thrombectomy, Rt above knee to below knee popliteal artery bypass.  Pt's PMH includes osteopenia, atypical chest pain.  Clinical Impression  Patient is s/p above surgery resulting in functional limitations due to the deficits listed below (see PT Problem List). Evaluation limited as Mrs. Mactaggart became hypotensive after sit<>stand from bed x3 (see general notes below).  Pt will have 24/7 assist at d/c from husband.  Patient will benefit from skilled PT to increase their independence and safety with mobility to allow discharge to the venue listed below.      Follow Up Recommendations Home health PT;Supervision for mobility/OOB    Equipment Recommendations  Rolling walker with 5" wheels    Recommendations for Other Services       Precautions / Restrictions Precautions Precautions: Fall Precaution Comments: monitor BP; vaginal bleeding Restrictions Weight Bearing Restrictions: No      Mobility  Bed Mobility Overal bed mobility: Needs Assistance Bed Mobility: Supine to Sit;Sit to Supine     Supine to sit: Min guard;HOB elevated Sit to supine: Min guard   General bed mobility comments: Increased time and cues for technique.  Pt uses bed rails.   Transfers Overall transfer level: Needs assistance Equipment used: Rolling walker (2 wheeled) Transfers: Sit to/from Stand Sit to Stand: Min assist         General transfer comment: Sit<>stand x3 from bed.  Cues for hand placement and to stand upright.  After standing for the third time pt becomes dizzy and nauseous (see general notes below)  Ambulation/Gait                Stairs            Wheelchair Mobility    Modified Rankin (Stroke Patients Only)       Balance Overall balance  assessment: Needs assistance Sitting-balance support: Bilateral upper extremity supported;Feet supported Sitting balance-Leahy Scale: Good     Standing balance support: Bilateral upper extremity supported;During functional activity Standing balance-Leahy Scale: Poor Standing balance comment: RW and assist for support                             Pertinent Vitals/Pain Pain Assessment: Faces Faces Pain Scale: Hurts even more Pain Location: Rt LE Pain Descriptors / Indicators: Discomfort;Moaning;Tightness Pain Intervention(s): Monitored during session;Limited activity within patient's tolerance;Repositioned    Home Living Family/patient expects to be discharged to:: Private residence   Available Help at Discharge: Family;Available 24 hours/day Type of Home: House Home Access: Stairs to enter Entrance Stairs-Rails: Chemical engineer of Steps: 3 Home Layout: Two level;Able to live on main level with bedroom/bathroom Home Equipment: None      Prior Function Level of Independence: Independent               Hand Dominance        Extremity/Trunk Assessment   Upper Extremity Assessment: Overall WFL for tasks assessed           Lower Extremity Assessment: RLE deficits/detail RLE Deficits / Details: limited ROM and strengh as expected s/p bypass       Communication   Communication: No difficulties  Cognition Arousal/Alertness: Awake/alert Behavior During Therapy: WFL for tasks assessed/performed Overall Cognitive Status: Within Functional Limits for  tasks assessed                      General Comments General comments (skin integrity, edema, etc.): Pt became nauseous and dizzy after sit>stand x3 and returned to supine and vitals read: BP 81/49, HR 78, SpO2 98%, RR 22.  After ~5 minutes supine BP 100/60 and symptoms resolved.    Exercises        Assessment/Plan    PT Assessment Patient needs continued PT services  PT  Diagnosis Difficulty walking;Acute pain   PT Problem List Decreased strength;Decreased activity tolerance;Decreased range of motion;Decreased balance;Decreased mobility;Decreased knowledge of use of DME;Decreased safety awareness;Decreased knowledge of precautions;Cardiopulmonary status limiting activity;Pain  PT Treatment Interventions DME instruction;Gait training;Stair training;Functional mobility training;Therapeutic activities;Therapeutic exercise;Balance training;Neuromuscular re-education;Patient/family education;Modalities   PT Goals (Current goals can be found in the Care Plan section) Acute Rehab PT Goals Patient Stated Goal: to be able to walk PT Goal Formulation: With patient/family Time For Goal Achievement: 12/11/15 Potential to Achieve Goals: Good    Frequency Min 3X/week   Barriers to discharge Inaccessible home environment steps to enter home    Co-evaluation               End of Session Equipment Utilized During Treatment: Gait belt Activity Tolerance: Treatment limited secondary to medical complications (Comment);Patient limited by pain (hypotensive) Patient left: in bed;with call bell/phone within reach;with family/visitor present;with nursing/sitter in room Nurse Communication: Mobility status;Other (comment) (BP)         Time: [id number] PT Time Calculation (min) (ACUTE ONLY): 32 min   Charges:   PT Evaluation $Initial PT Evaluation Tier I: 1 Procedure PT Treatments $Therapeutic Activity: 8-22 mins   PT G Codes:       Joslyn Hy PT, DPT [phone] Pager: [phone] 11/30/2015, 3:06 PM

## 2015-11-30 NOTE — Progress Notes (Signed)
ANTICOAGULATION CONSULT NOTE - Follow Up Consult  Pharmacy Consult for heparin  Indication: graft occlusion / thromboembolism  No Known Allergies  Patient Measurements: Height: 5\' 7"  (170.2 cm) Weight: 157 lb 6.5 oz (71.4 kg) IBW/kg (Calculated) : 61.6  Vital Signs: Temp: 98.7 F (37.1 C) (12/31 1812) Temp Source: Oral (12/31 1812) BP: 102/55 mmHg (12/31 1811) Pulse Rate: 89 (12/31 1811)  Labs:  Recent Labs  11/29/15 1130 11/29/15 1131 11/30/15 0545 11/30/15 1700  HGB 13.4  --  12.4  --   HCT 41.0  --  37.0  --   PLT 123* 121* 114*  --   APTT  --  27  --  30  LABPROT 13.1 13.4  --   --   INR 0.97 1.00  --   --   HEPARINUNFRC  --   --  0.28* <0.10*  CREATININE 0.71  --  0.76  --     Estimated Creatinine Clearance: 70.9 mL/min (by C-G formula based on Cr of 0.76).   Assessment: Heparin MD dosing to per pharmacy for thromboembolism of R popliteal and tibial arteries. S/P thrombectomy 12/30. Pt was on 400 units/hr starting approximately 0100 this morning with HL of 0.28 5 hours later. Rate increased slightly and now level undetectable at < 0.1. Talked with RN and no issues with heparin running. Pt does have some vaginal bleeding but has not gotten worse since earlier. GYN had been consulted. Hgb stable, plts 114. SCr stable at 0.76 with est. CrCl ~70 ml/min. Not on PTA anticoagulation.    Goal of Therapy:  Heparin level 0.3-0.7 units/ml Monitor platelets by anticoagulation protocol: Yes   Plan:  Increase heparin gtt to 650 units/hr Monitor daily HL, CBC, s/s of bleed Check 6 hr HL  Elenor Quinones, PharmD, [facility] Clinical Pharmacist Pager [phone] 11/30/2015 6:55 PM

## 2015-11-30 NOTE — Progress Notes (Signed)
Patient arrived from PACU alert and oriented X 4 with some complaints of nausea and pain, IV morphine and IV Zofran administered with minimal effect, shortly after administration of medication patient stated she was going to vomit, patient with approximately 50 cc emesis, however, during episode patient with syncopal event, BP 74/45 dropping down to 62/48 during episode, HR down to 43, RR 11, event lasted about 1 minute and patient slowly came back to baseline, 2nd unit of blood retrieved and administered, patient with another episode of vomiting shortly after stating she felt as if she was going to pass out for a second time, BP 80/50, patient with emesis, however, no syncopal episode,  Dr. Bridgett Larsson notified 3 times with no return phone call , patient with no complaints of chest pain or SOB at this time, currently only complaining of nausea, IV Zofran maintained as needed, 2nd unit of blood complete, labs to be drawn at 0600, BP 90's/50's at this time, per transcribed order 500 mL normal saline bolus administered for SBP <100, will repeat another 500 mL normal saline once if needed per orders, patient resting comfortably at this time, incisions CDI, will continue to monitor closely.

## 2015-11-30 NOTE — Progress Notes (Signed)
ANTICOAGULATION CONSULT NOTE - Initial Consult  Pharmacy Consult for heparin Indication: graft occlusion  No Known Allergies  Patient Measurements: Height: 5\' 7"  (170.2 cm) Weight: 157 lb 6.5 oz (71.4 kg) IBW/kg (Calculated) : 61.6 Heparin Dosing Weight:   Vital Signs: Temp: 98.8 F (37.1 C) (12/31 0700) Temp Source: Oral (12/31 0700) BP: 90/54 mmHg (12/31 0800) Pulse Rate: 78 (12/31 0800)  Labs:  Recent Labs  11/29/15 1130 11/29/15 1131 11/30/15 0545  HGB 13.4  --  12.4  HCT 41.0  --  37.0  PLT 123* 121* 114*  APTT  --  27  --   LABPROT 13.1 13.4  --   INR 0.97 1.00  --   HEPARINUNFRC  --   --  0.28*  CREATININE 0.71  --  0.76    Estimated Creatinine Clearance: 70.9 mL/min (by C-G formula based on Cr of 0.76).   Medical History: Past Medical History  Diagnosis Date  . Excess or deficiency of vitamin D   . Basal cell carcinoma   . Microhematuria   . GERD (gastroesophageal reflux disease)   . Osteopenia   . Atypical chest pain   . Thrombocytopenia ([place])   . Gallbladder polyp   . Anal fissure   . Hemorrhoids     Medications:  Scheduled:  . sodium chloride   Intravenous Once  . cefUROXime (ZINACEF)  IV  1.5 g Intravenous Q12H  . docusate sodium  100 mg Oral Daily  . feeding supplement (PRO-STAT SUGAR FREE 64)  30 mL Oral Daily  . gabapentin  100 mg Oral QHS  . pantoprazole  40 mg Oral Daily  . promethazine      . Vitamin D (Ergocalciferol)  50,000 Units Oral Q7 days    Assessment: Heparin MD dosing to per pharmacy for thromboembolism of R popliteal and tibial arteries. S/P thrombectomy 12/30. Continuing heparin with no bolus per MD. Pt currently on 400 units/hr starting approximately 0100 this morning with HL of 0.28 5 hours later. Would likely be therapeutic at this rate but will increase slightly. Hgb 13.4>12.4, plt 123>121>114, baseline INR 1.0. SCr stable at 0.76 with est. CrCl ~70 ml/min. Not on PTA anticoagulation. Some vaginal bleeding per [facility]  note; GYN consulted.   Goal of Therapy:  Heparin level 0.3-0.7 units/ml Monitor platelets by anticoagulation protocol: Yes   Plan:  Increase heparin gtt to 450 units/hr 6hr HL Daily HL and CBC Monitor renal fcn, s/sx bleeding.   Judieth Keens, PharmD Clinical Pharmacy Resident 11/30/2015,9:59 AM

## 2015-11-30 NOTE — Progress Notes (Addendum)
Dr. Bridgett Larsson returned phone call and updated on patient status, BP currently 91/55, per MD verbal orders maintain fluids at 125 mL/hr and check labs as scheduled at 0600, IV phenergan ordered and administered for continuing complaints of nausea, patient resting comfortably at this time, will continue to monitor closely.

## 2015-12-01 ENCOUNTER — Inpatient Hospital Stay (HOSPITAL_COMMUNITY): Payer: BLUE CROSS/BLUE SHIELD

## 2015-12-01 DIAGNOSIS — R079 Chest pain, unspecified: Secondary | ICD-10-CM

## 2015-12-01 LAB — TYPE AND SCREEN
ABO/RH(D): O POS
ANTIBODY SCREEN: NEGATIVE
UNIT DIVISION: 0
UNIT DIVISION: 0

## 2015-12-01 LAB — BASIC METABOLIC PANEL
Anion gap: 5 (ref 5–15)
BUN: 9 mg/dL (ref 6–20)
CALCIUM: 7.7 mg/dL — AB (ref 8.9–10.3)
CO2: 22 mmol/L (ref 22–32)
CREATININE: 0.58 mg/dL (ref 0.44–1.00)
Chloride: 116 mmol/L — ABNORMAL HIGH (ref 101–111)
GFR calc Af Amer: >60 mL/min (ref >60)
GFR calc non Af Amer: >60 mL/min (ref >60)
GLUCOSE: 97 mg/dL (ref 65–99)
Potassium: 3.9 mmol/L (ref 3.5–5.1)
Sodium: 143 mmol/L (ref 135–145)

## 2015-12-01 LAB — LIPID PANEL
CHOL/HDL RATIO: 3.3 ratio
Cholesterol: 114 mg/dL (ref 0–200)
HDL: 35 mg/dL — ABNORMAL LOW (ref >40)
LDL Cholesterol: 69 mg/dL (ref 0–99)
Triglycerides: 50 mg/dL (ref <150)
VLDL: 10 mg/dL (ref 0–40)

## 2015-12-01 LAB — CBC
HCT: 31.7 % — ABNORMAL LOW (ref 36.0–46.0)
Hemoglobin: 10.4 g/dL — ABNORMAL LOW (ref 12.0–15.0)
MCH: 31.5 pg (ref 26.0–34.0)
MCHC: 32.8 g/dL (ref 30.0–36.0)
MCV: 96.1 fL (ref 78.0–100.0)
PLATELETS: 87 10*3/uL — AB (ref 150–400)
RBC: 3.3 MIL/uL — ABNORMAL LOW (ref 3.87–5.11)
RDW: 14.7 % (ref 11.5–15.5)
WBC: 6.7 10*3/uL (ref 4.0–10.5)

## 2015-12-01 LAB — HEPARIN LEVEL (UNFRACTIONATED)

## 2015-12-01 LAB — APTT: aPTT: 58 seconds — ABNORMAL HIGH (ref 24–37)

## 2015-12-01 MED ORDER — WARFARIN - PHARMACIST DOSING INPATIENT
Freq: Every day | Status: DC
  Administered 2015-12-02: 18:00:00

## 2015-12-01 MED ORDER — IOHEXOL 350 MG/ML SOLN
100.0000 mL | Freq: Once | INTRAVENOUS | Status: AC | PRN
  Administered 2015-12-01: 100 mL via INTRAVENOUS

## 2015-12-01 MED ORDER — ENOXAPARIN SODIUM 80 MG/0.8ML ~~LOC~~ SOLN
1.0000 mg/kg | Freq: Two times a day (BID) | SUBCUTANEOUS | Status: DC
  Administered 2015-12-01 – 2015-12-03 (×5): 70 mg via SUBCUTANEOUS
  Filled 2015-12-01 (×5): qty 0.8

## 2015-12-01 MED ORDER — WARFARIN SODIUM 5 MG PO TABS
5.0000 mg | ORAL_TABLET | Freq: Once | ORAL | Status: AC
  Administered 2015-12-01: 5 mg via ORAL
  Filled 2015-12-01: qty 1

## 2015-12-01 MED ORDER — COUMADIN BOOK
Freq: Once | Status: AC
  Administered 2015-12-01: 1
  Filled 2015-12-01: qty 1

## 2015-12-01 NOTE — Progress Notes (Signed)
ANTICOAGULATION CONSULT NOTE - Follow Up Consult  Pharmacy Consult for Lovenox/warfarin  Indication: Popliteal/Tibial thromboembolism   No Known Allergies  Patient Measurements: Height: 5\' 7"  (170.2 cm) Weight: 157 lb 6.5 oz (71.4 kg) IBW/kg (Calculated) : 61.6  Vital Signs: Temp: 98.2 F (36.8 C) (01/01 0744) Temp Source: Oral (01/01 0744) BP: 106/64 mmHg (01/01 0744) Pulse Rate: 81 (01/01 0744)  Labs:  Recent Labs  11/29/15 1130 11/29/15 1131 11/30/15 0545 11/30/15 1700 12/01/15 0049 12/01/15 0432  HGB 13.4  --  12.4  --   --  10.4*  HCT 41.0  --  37.0  --   --  31.7*  PLT 123* 121* 114*  --   --  87*  APTT  --  27  --  30  --  58*  LABPROT 13.1 13.4  --   --   --   --   INR 0.97 1.00  --   --   --   --   HEPARINUNFRC  --   --  0.28* <0.10* <0.10*  --   CREATININE 0.71  --  0.76  --   --  0.58    Estimated Creatinine Clearance: 70.9 mL/min (by C-G formula based on Cr of 0.58).  Assessment: Heparin to lovenox/warfarin per pharmacy for thromboembolism of R popliteal and tibial arteries. S/P thrombectomy 12/30. Heparin infusion stopped at approx. 0845; will time first lovenox dose for 1000.  Baseline INR 1.0, SCr 0.58, Hgb 13.4>12.4>10.4, plt 121>114>87. Pt does have some vaginal bleeding but has not gotten worse since earlier. GYN had been consulted. No further bleeding reported.  Goal of Therapy:  INR 2-3 Monitor platelets by anticoagulation protocol: Yes   Plan:  Lovenox 70mg  SQ q12h Warfarin 5mg  x1 tonight Daily INR and CBC q72h Monitor renal fcn, s/sx bleeding.   Judieth Keens, PharmD Clinical Pharmacy Resident 12/01/2015,8:44 AM

## 2015-12-01 NOTE — Progress Notes (Signed)
  Echocardiogram 2D Echocardiogram has been performed.  Darlina Sicilian M 12/01/2015, 3:05 PM

## 2015-12-01 NOTE — Progress Notes (Signed)
Report called pt to transfer to 2W30 with belongings, husband at bed side

## 2015-12-01 NOTE — Progress Notes (Addendum)
   Daily Progress Note   Assessment/Planning: POD #2 s/p TE R pop, PT and AT arteries; R pop-pop bypass, R leg runoff for: thromboembolism popliteal artery and tibial arteries, likely chronic popliteal artery disease   Hemodynamics have stabilized, ok to transfer to 2W  Palpable DP pulse today  Continue anticoagulation - No hematoma in all incison so far  Change to Lovenox and start coumadin  Home PT on discharge  Echo pending - might be Tuesday before done, could be done as outpatient also  CTA chest/abd/pelvis ordered   Lipid profile excellent, Prior A1c excellent   Subjective  - 2 Days Post-Op  Mild incisional pain  Objective Filed Vitals:   11/30/15 2000 11/30/15 2330 12/01/15 0329 12/01/15 0744  BP: 96/61 97/55 106/65 106/64  Pulse: 96 89 82 81  Temp:  98.1 F (36.7 C) 98.2 F (36.8 C) 98.2 F (36.8 C)  TempSrc:  Oral Oral Oral  Resp: 24 14 17 18   Height:      Weight:      SpO2: 96% 98% 98% 96%    Intake/Output Summary (Last 24 hours) at 12/01/15 0752 Last data filed at 12/01/15 0744  Gross per 24 hour  Intake 3843.02 ml  Output   3750 ml  Net  93.02 ml    PULM  CTAB, improved from yesterday CV  RRR GI  soft, NTND VASC  Inc c/d/i throughout without hematoma, Palpable DP, faintly palpable PT  Laboratory CBC    Component Value Date/Time   WBC 6.7 12/01/2015 0432   WBC 4.7 10/16/2015   WBC 3.9 12/08/2006 1042   HGB 10.4* 12/01/2015 0432   HGB 14.4 12/08/2006 1042   HCT 31.7* 12/01/2015 0432   HCT 41.7 12/08/2006 1042   PLT 87* 12/01/2015 0432   PLT 170 12/08/2006 1042    BMET    Component Value Date/Time   NA 143 12/01/2015 0432   NA 144 10/16/2015   K 3.9 12/01/2015 0432   CL 116* 12/01/2015 0432   CO2 22 12/01/2015 0432   GLUCOSE 97 12/01/2015 0432   GLUCOSE 79 11/19/2006 1355   BUN 9 12/01/2015 0432   BUN 12 10/16/2015   CREATININE 0.58 12/01/2015 0432   CREATININE 0.6 10/16/2015   CALCIUM 7.7* 12/01/2015 0432   GFRNONAA >60  12/01/2015 0432   GFRAA >60 12/01/2015 [place], MD [facility]: [phone] Pager: [phone]  12/01/2015, 7:52 AM

## 2015-12-01 NOTE — Progress Notes (Signed)
ANTICOAGULATION CONSULT NOTE - Follow Up Consult  Pharmacy Consult for Heparin  Indication: Popliteal/Tibial thromboembolism   No Known Allergies  Patient Measurements: Height: 5\' 7"  (170.2 cm) Weight: 157 lb 6.5 oz (71.4 kg) IBW/kg (Calculated) : 61.6  Vital Signs: Temp: 98.1 F (36.7 C) (12/31 2330) Temp Source: Oral (12/31 2330) BP: 97/55 mmHg (12/31 2330) Pulse Rate: 89 (12/31 2330)  Labs:  Recent Labs  11/29/15 1130 11/29/15 1131 11/30/15 0545 11/30/15 1700 12/01/15 0049  HGB 13.4  --  12.4  --   --   HCT 41.0  --  37.0  --   --   PLT 123* 121* 114*  --   --   APTT  --  27  --  30  --   LABPROT 13.1 13.4  --   --   --   INR 0.97 1.00  --   --   --   HEPARINUNFRC  --   --  0.28* <0.10* <0.10*  CREATININE 0.71  --  0.76  --   --     Estimated Creatinine Clearance: 70.9 mL/min (by C-G formula based on Cr of 0.76).  Assessment: Heparin level remains undetectable after rate increase  Goal of Therapy:  Heparin level 0.3-0.7 units/ml Monitor platelets by anticoagulation protocol: Yes   Plan:  -Increase heparin to 850 units/hr -1000 HL  Elizabeth Douglas 12/01/2015,2:16 AM

## 2015-12-02 ENCOUNTER — Inpatient Hospital Stay (HOSPITAL_COMMUNITY): Payer: BLUE CROSS/BLUE SHIELD

## 2015-12-02 DIAGNOSIS — I749 Embolism and thrombosis of unspecified artery: Secondary | ICD-10-CM

## 2015-12-02 LAB — CBC
HEMATOCRIT: 34.6 % — AB (ref 36.0–46.0)
HEMOGLOBIN: 11.2 g/dL — AB (ref 12.0–15.0)
MCH: 30.9 pg (ref 26.0–34.0)
MCHC: 32.4 g/dL (ref 30.0–36.0)
MCV: 95.3 fL (ref 78.0–100.0)
Platelets: 95 10*3/uL — ABNORMAL LOW (ref 150–400)
RBC: 3.63 MIL/uL — ABNORMAL LOW (ref 3.87–5.11)
RDW: 14 % (ref 11.5–15.5)
WBC: 6.2 10*3/uL (ref 4.0–10.5)

## 2015-12-02 LAB — PROTIME-INR
INR: 1.2 (ref 0.00–1.49)
Prothrombin Time: 15.4 seconds — ABNORMAL HIGH (ref 11.6–15.2)

## 2015-12-02 MED ORDER — WARFARIN SODIUM 5 MG PO TABS
5.0000 mg | ORAL_TABLET | Freq: Once | ORAL | Status: AC
  Administered 2015-12-02: 5 mg via ORAL
  Filled 2015-12-02 (×2): qty 1

## 2015-12-02 NOTE — Discharge Instructions (Signed)

## 2015-12-02 NOTE — Progress Notes (Signed)
Physical Therapy Treatment Patient Details Name: Elizabeth Douglas MRN: [id number] DOB: [birth year] Today's Date: 12/02/2015    History of Present Illness Pt is a 65 y/o F s/p Rt popliteal to anterior tibial and posterior tibial artery thrombectomy, Rt above knee to below knee popliteal artery bypass.  Pt's PMH includes osteopenia, atypical chest pain.    PT Comments    Patient progressing well towards PT goals. Tolerated gait training and stair training today with Min guard assist for safety. Discussed activity progression and safe stair negotiation techniques with pt and spouse. Encouraged ambulation 3 times per day with spouse and RW. Discharge recommendation updated to OPPT as pt's mobility much improved from prior session and not limited due to hypotension. Would benefit from OPPT once cleared from MD. Will follow acutely.  Follow Up Recommendations  Outpatient PT;Supervision for mobility/OOB     Equipment Recommendations  Rolling walker with 5" wheels    Recommendations for Other Services       Precautions / Restrictions Precautions Precautions: Fall Restrictions Weight Bearing Restrictions: No    Mobility  Bed Mobility               General bed mobility comments: Sitting in recliner upon PT arrival.   Transfers Overall transfer level: Needs assistance Equipment used: Rolling walker (2 wheeled) Transfers: Sit to/from Stand Sit to Stand: Min guard         General transfer comment: Min guard for safety. Stood from Automotive engineer. Cues to wait a few seconds prior to mobilizing to ensure no dizziness.  Ambulation/Gait Ambulation/Gait assistance: Min guard Ambulation Distance (Feet): 100 Feet (+ 50') Assistive device: Rolling walker (2 wheeled) Gait Pattern/deviations: Step-to pattern;Step-through pattern;Decreased stance time - right;Decreased step length - left;Antalgic Gait velocity: decreased Gait velocity interpretation: Below normal speed for age/gender General  Gait Details: Slow, mostly steady gait using RW. CUes for step through gait pattern and heel strike.    Stairs Stairs: Yes Stairs assistance: Min guard Stair Management: Step to pattern;One rail Right;Forwards Number of Stairs: 6 General stair comments: Cues for technique/safety.   Wheelchair Mobility    Modified Rankin (Stroke Patients Only)       Balance Overall balance assessment: Needs assistance Sitting-balance support: Feet supported;No upper extremity supported Sitting balance-Leahy Scale: Good     Standing balance support: During functional activity Standing balance-Leahy Scale: Poor Standing balance comment: RW for support.                     Cognition Arousal/Alertness: Awake/alert Behavior During Therapy: WFL for tasks assessed/performed Overall Cognitive Status: Within Functional Limits for tasks assessed                      Exercises      General Comments General comments (skin integrity, edema, etc.): Spouse present during session. Reviewed stair negotiation technique and discussed activity progression at home.       Pertinent Vitals/Pain Pain Assessment: Faces Faces Pain Scale: Hurts a little bit Pain Location: RLE Pain Descriptors / Indicators: Sore Pain Intervention(s): Monitored during session;Repositioned    Home Living Family/patient expects to be discharged to:: Private residence Living Arrangements: Spouse/significant other Available Help at Discharge: Family;Available 24 hours/day Type of Home: House Home Access: Stairs to enter Entrance Stairs-Rails: Left;Right Home Layout: Two level;Able to live on main level with bedroom/bathroom Home Equipment: None      Prior Function Level of Independence: Independent  PT Goals (current goals can now be found in the care plan section) Acute Rehab PT Goals Patient Stated Goal: to be able to take care of myself Progress towards PT goals: Progressing toward goals     Frequency  Min 3X/week    PT Plan Current plan remains appropriate    Co-evaluation             End of Session Equipment Utilized During Treatment: Gait belt Activity Tolerance: Patient tolerated treatment well Patient left: in chair;with call bell/phone within reach;with family/visitor present     Time: [id number] PT Time Calculation (min) (ACUTE ONLY): 23 min  Charges:  $Gait Training: 23-37 mins                    G Codes:      [place] 12/02/2015, 12:10 PM Wray Kearns, [place], DPT [phone]

## 2015-12-02 NOTE — Care Management Note (Signed)
Case Management Note [name] RN, BSN Unit 2W-Case Manager [phone]  Patient Details  Name: Elizabeth Douglas MRN: [id number] Date of Birth: 02-22-1951  Subjective/Objective:  Pt admitted s/p fempop                  Action/Plan: PTA pt lived at home with spouse- anticipate return home- per updated PT notes from 12/02/15- recommendations for outpt PT when cleared by MD- pt will need prescription for outpt PT- to take to a local outpt therapy location close to home- have discussed this with pt and she would prefer to do outpt PT close to home-knows location in her area. Also discussed DME needs- pt will need 3n1 and RW for home on discharge- MD please place order for DME -RW and 3n1. CM will f/u prior to discharge.  Expected Discharge Date:     12/03/15             Expected Discharge Plan:  Home/Self Care  In-House Referral:     Discharge planning Services  CM Consult  Post Acute Care Choice:  Durable Medical Equipment Choice offered to:  Patient  DME Arranged:  3-N-1, Walker rolling DME Agency:  [place]:  NA [place] Agency:     Status of Service:  In process, will continue to follow  Medicare Important Message Given:    Date Medicare IM Given:    Medicare IM give by:    Date Additional Medicare IM Given:    Additional Medicare Important Message give by:     If discussed at [place] of Stay Meetings, dates discussed:    Additional Comments:  Dawayne Patricia, RN 12/02/2015, 2:11 PM

## 2015-12-02 NOTE — Progress Notes (Signed)
VASCULAR LAB PRELIMINARY  ARTERIAL  ABI completed:    RIGHT    LEFT    PRESSURE WAVEFORM  PRESSURE WAVEFORM  BRACHIAL 124 Tri BRACHIAL 122 Tri  DP   DP    AT 132 Tri AT 137 Tri  PT 129 Tri PT 142 Tri  PER   PER    GREAT TOE  NA GREAT TOE  NA    RIGHT LEFT  ABI 1.06 1.15     Landry Mellow, RDMS, RVT  12/02/2015, 12:47 PM

## 2015-12-02 NOTE — Progress Notes (Signed)
   Daily Progress Note  Assessment/Planning: POD #3 s/p TE R pop, PT and AT arteries; R pop-pop bypass, R leg runoff for: thromboembolism popliteal artery and tibial arteries, likely chronic popliteal artery disease   CTA chest/abd/pelvis clean  Echo clean  This suggests focal popliteal disease: popliteal entrapment or cystic adventitial disease.  Would continued anticoagulation for 3 months to give her the best chance of lysis of residual tibial thrombus  Home once coumadin loaded and ambulating better  Awaiting ABI  Subjective  - 3 Days Post-Op  Having problems getting up to commode  Objective Filed Vitals:   12/01/15 1400 12/01/15 1815 12/01/15 2014 12/02/15 0533  BP: 101/65 120/67 107/54 112/63  Pulse: 75 89 91 81  Temp: 98 F (36.7 C) 98.3 F (36.8 C) 99.4 F (37.4 C) 99.1 F (37.3 C)  TempSrc: Oral Oral Oral Oral  Resp: 18 18 18  187  Height:      Weight:      SpO2: 95% 98% 97% 96%    Intake/Output Summary (Last 24 hours) at 12/02/15 1003 Last data filed at 12/02/15 0804  Gross per 24 hour  Intake    480 ml  Output   1900 ml  Net  -1420 ml    PULM  CTAB CV  RRR GI  soft, NTND VASC  Inc c/d/i, no hematoma, warm foot, faintly palpable PT, palpable DP  Laboratory CBC    Component Value Date/Time   WBC 6.2 12/02/2015 0240   WBC 4.7 10/16/2015   WBC 3.9 12/08/2006 1042   HGB 11.2* 12/02/2015 0240   HGB 14.4 12/08/2006 1042   HCT 34.6* 12/02/2015 0240   HCT 41.7 12/08/2006 1042   PLT 95* 12/02/2015 0240   PLT 170 12/08/2006 1042    BMET    Component Value Date/Time   NA 143 12/01/2015 0432   NA 144 10/16/2015   K 3.9 12/01/2015 0432   CL 116* 12/01/2015 0432   CO2 22 12/01/2015 0432   GLUCOSE 97 12/01/2015 0432   GLUCOSE 79 11/19/2006 1355   BUN 9 12/01/2015 0432   BUN 12 10/16/2015   CREATININE 0.58 12/01/2015 0432   CREATININE 0.6 10/16/2015   CALCIUM 7.7* 12/01/2015 0432   GFRNONAA >60 12/01/2015 0432   GFRAA >60 12/01/2015 0432    Lab Results  Component Value Date   INR 1.20 12/02/2015   INR 1.00 11/29/2015   INR 0.97 11/29/2015     [name], MD [facility]: [phone] Pager: [phone]  12/02/2015, 10:03 AM

## 2015-12-02 NOTE — Progress Notes (Signed)
Occupational Therapy Evaluation Patient Details Name: Elizabeth Douglas MRN: [id number] DOB: 20-May-1951 Today's Date: 12/02/2015    History of Present Illness Pt is a 65 y/o F s/p Rt popliteal to anterior tibial and posterior tibial artery thrombectomy, Rt above knee to below knee popliteal artery bypass.  Pt's PMH includes osteopenia, atypical chest pain.   Clinical Impression   Patient presents to OT with decreased ADL independence and generalized weakness s/p above procedure and functional deficits listed below. Patient will benefit from skilled OT to maximize independence and to facilitate a safe discharge plan. OT will follow.    Follow Up Recommendations  No OT follow up;Supervision/Assistance - 24 hour initially (which her husband can provide)    Equipment Recommendations  3 in 1 bedside comode    Recommendations for Other Services       Precautions / Restrictions Precautions Precautions: Fall Restrictions Weight Bearing Restrictions: No      Mobility Bed Mobility                  Transfers Overall transfer level: Needs assistance Equipment used: Rolling walker (2 wheeled) Transfers: Sit to/from Stand Sit to Stand: Min guard;Min assist              Balance                                            ADL Overall ADL's : Needs assistance/impaired Eating/Feeding: Independent;Sitting   Grooming: Wash/dry hands;Min guard;Standing   Upper Body Bathing: Set up;Sitting   Lower Body Bathing: Moderate assistance;Sit to/from stand   Upper Body Dressing : Set up;Sitting   Lower Body Dressing: Moderate assistance;Sit to/from stand   Toilet Transfer: Ambulation;Regular Toilet;Grab bars;RW;Minimal assistance   Toileting- Clothing Manipulation and Hygiene: Supervision/safety;Sitting/lateral lean       Functional mobility during ADLs: Min guard;Rolling walker General ADL Comments: Patient presents sitting in chair, husband present.  Reports nurse tech assisted her with bathing/toileting via BSC this morning. Discussion with patient regarding home setup and potential equipment needs. Recommend 3 in 1 as patient having difficulty rising from low toilet. Patient able to ambulate to bathroom with RW, then stood at sink to wash hands, then returned to recliner. Discussed potential need for shower chair due to 1st floor bathroom having tub/shower. Upstairs bathroom is walk-in shower with seat. Patient's first choice is to be able to do stairs so she can use walk-in shower. If not, they will use tub/shower and educated patient and husband on shower chair. They will decide once they know about stairs. Also educated patient and husband about long-handled ADL equipment available. OT will follow.     Vision     Perception     Praxis      Pertinent Vitals/Pain Pain Assessment: No/denies pain Faces Pain Scale: Hurts a little bit Pain Location: RLE Pain Descriptors / Indicators: Sore Pain Intervention(s): Monitored during session     Hand Dominance Right   Extremity/Trunk Assessment Upper Extremity Assessment Upper Extremity Assessment: Overall WFL for tasks assessed   Lower Extremity Assessment Lower Extremity Assessment: Defer to PT evaluation   Cervical / Trunk Assessment Cervical / Trunk Assessment: Normal   Communication Communication Communication: No difficulties   Cognition Arousal/Alertness: Awake/alert Behavior During Therapy: WFL for tasks assessed/performed Overall Cognitive Status: Within Functional Limits for tasks assessed  General Comments       Exercises       Shoulder Instructions      Home Living Family/patient expects to be discharged to:: Private residence Living Arrangements: Spouse/significant other Available Help at Discharge: Family;Available 24 hours/day Type of Home: House Home Access: Stairs to enter [employer] of Steps: 3 Entrance  Stairs-Rails: Left;Right Home Layout: Two level;Able to live on main level with bedroom/bathroom Alternate Level Stairs-Number of Steps: flight   Bathroom Shower/Tub: Tub/shower unit;Walk-in shower (walk-in shower with seat is on second level)   Bathroom Toilet: Standard Bathroom Accessibility: Yes How Accessible: Accessible via walker Home Equipment: None          Prior Functioning/Environment Level of Independence: Independent             OT Diagnosis: Generalized weakness;Acute pain   OT Problem List: Decreased activity tolerance;Decreased knowledge of use of DME or AE;Pain   OT Treatment/Interventions: Self-care/ADL training;DME and/or AE instruction;Therapeutic activities;Patient/family education    OT Goals(Current goals can be found in the care plan section) Acute Rehab OT Goals Patient Stated Goal: to be able to take care of myself OT Goal Formulation: With patient Time For Goal Achievement: 12/16/15 Potential to Achieve Goals: Good  OT Frequency: Min 3X/week   Barriers to D/C:            Co-evaluation              End of Session Equipment Utilized During Treatment: Rolling walker  Activity Tolerance: Patient tolerated treatment well Patient left: in chair;with call bell/phone within reach;with family/visitor present   Time: [id number] OT Time Calculation (min): 24 min Charges:  OT General Charges $OT Visit: 1 Procedure OT Evaluation $Initial OT Evaluation Tier I: 1 Procedure OT Treatments $Self Care/Home Management : 8-22 mins G-Codes:    Jodilyn Giese A 12/26/2015, 10:28 AM

## 2015-12-02 NOTE — Progress Notes (Signed)
ANTICOAGULATION CONSULT NOTE - Follow Up Consult  Pharmacy Consult for Lovenox/warfarin  Indication: Popliteal/Tibial thromboembolism   No Known Allergies  Patient Measurements: Height: 5\' 7"  (170.2 cm) Weight: 157 lb 6.5 oz (71.4 kg) IBW/kg (Calculated) : 61.6  Vital Signs: Temp: 99.1 F (37.3 C) (01/02 0533) Temp Source: Oral (01/02 0533) BP: 112/63 mmHg (01/02 0533) Pulse Rate: 81 (01/02 0533)  Labs:  Recent Labs  11/29/15 1130 11/29/15 1131 11/30/15 0545 11/30/15 1700 12/01/15 0049 12/01/15 0432 12/02/15 0240 12/02/15 0344  HGB 13.4  --  12.4  --   --  10.4* 11.2*  --   HCT 41.0  --  37.0  --   --  31.7* 34.6*  --   PLT 123* 121* 114*  --   --  87* 95*  --   APTT  --  27  --  30  --  58*  --   --   LABPROT 13.1 13.4  --   --   --   --   --  15.4*  INR 0.97 1.00  --   --   --   --   --  1.20  HEPARINUNFRC  --   --  0.28* <0.10* <0.10*  --   --   --   CREATININE 0.71  --  0.76  --   --  0.58  --   --     Estimated Creatinine Clearance: 70.9 mL/min (by C-G formula based on Cr of 0.58).  Assessment: 27 yof continues on lovenox + warfarin for thromboembolism of R popliteal and tibial arteries. S/P thrombectomy 12/30. INR is subtherapeutic at 1.2 as expected. H/H is stable. Platelets are low but up from yesterday. No overt bleeding noted.   Goal of Therapy:  INR 2-3 Monitor platelets by anticoagulation protocol: Yes   Plan:  - Continue lovenox 70mg  SQ Q12H - Repeat warfarin 5mg  PO x 1 tonight  - Daily INR and CBC  Salome Arnt, PharmD, BCPS Pager # [phone] 12/02/2015 10:06 AM

## 2015-12-03 ENCOUNTER — Telehealth: Payer: Self-pay | Admitting: Vascular Surgery

## 2015-12-03 ENCOUNTER — Encounter (HOSPITAL_COMMUNITY): Payer: Self-pay | Admitting: Vascular Surgery

## 2015-12-03 ENCOUNTER — Ambulatory Visit: Payer: BLUE CROSS/BLUE SHIELD | Admitting: Family Medicine

## 2015-12-03 LAB — GC/CHLAMYDIA PROBE AMP (~~LOC~~) NOT AT ARMC
CHLAMYDIA, DNA PROBE: NEGATIVE
NEISSERIA GONORRHEA: NEGATIVE

## 2015-12-03 LAB — CBC
HEMATOCRIT: 33.4 % — AB (ref 36.0–46.0)
Hemoglobin: 11.1 g/dL — ABNORMAL LOW (ref 12.0–15.0)
MCH: 31.4 pg (ref 26.0–34.0)
MCHC: 33.2 g/dL (ref 30.0–36.0)
MCV: 94.4 fL (ref 78.0–100.0)
Platelets: 115 10*3/uL — ABNORMAL LOW (ref 150–400)
RBC: 3.54 MIL/uL — AB (ref 3.87–5.11)
RDW: 13.6 % (ref 11.5–15.5)
WBC: 5.9 10*3/uL (ref 4.0–10.5)

## 2015-12-03 LAB — HEMOGLOBIN A1C
HEMOGLOBIN A1C: 5.6 % (ref 4.8–5.6)
Mean Plasma Glucose: 114 mg/dL

## 2015-12-03 LAB — PROTIME-INR
INR: 1.26 (ref 0.00–1.49)
Prothrombin Time: 16 seconds — ABNORMAL HIGH (ref 11.6–15.2)

## 2015-12-03 MED ORDER — OXYCODONE HCL 5 MG PO TABS: 5.0000 mg | ORAL_TABLET | ORAL | Status: DC | PRN

## 2015-12-03 MED ORDER — WARFARIN SODIUM 7.5 MG PO TABS: 7.5000 mg | ORAL_TABLET | Freq: Once | ORAL | Status: DC

## 2015-12-03 MED ORDER — ENOXAPARIN (LOVENOX) PATIENT EDUCATION KIT
PACK | Freq: Once | Status: AC
  Administered 2015-12-03: 11:00:00
  Filled 2015-12-03: qty 1

## 2015-12-03 MED ORDER — ENOXAPARIN SODIUM 100 MG/ML ~~LOC~~ SOLN: 100.0000 mg | SUBCUTANEOUS | Status: DC

## 2015-12-03 MED ORDER — WARFARIN SODIUM 5 MG PO TABS: ORAL_TABLET | ORAL | Status: DC

## 2015-12-03 NOTE — Telephone Encounter (Signed)
Spoke with pt to schedule, dpm °

## 2015-12-03 NOTE — Telephone Encounter (Signed)
-----   Message from Mena Goes, RN sent at 12/03/2015  9:58 AM EST ----- Regarding: schedule   ----- Message -----    From: Conrad Lancaster, MD    Sent: 12/03/2015   9:26 AM      To: [address]  KALLA ZUPKO [id number] 1951/03/15  Follow-up: 3 JAN for staple removal

## 2015-12-03 NOTE — Care Management Note (Addendum)
Case Management Note Elizabeth Gibbons RN, BSN Unit 2W-Case Manager [phone]  Patient Details  Name: Elizabeth Douglas MRN: [id number] Date of Birth: 16-Mar-1951  Subjective/Objective:  Pt admitted s/p fempop                  Action/Plan: PTA pt lived at home with spouse- anticipate return home- per updated PT notes from 12/02/15- recommendations for outpt PT when cleared by MD- pt will need prescription for outpt PT- to take to a local outpt therapy location close to home- have discussed this with pt and she would prefer to do outpt PT close to home-knows location in her area. Also discussed DME needs- pt will need 3n1 and RW for home on discharge- MD please place order for DME -RW and 3n1. CM will f/u prior to discharge.  Expected Discharge Date:     12/03/15             Expected Discharge Plan:  Home/Self Care  In-House Referral:     Discharge planning Services  CM Consult, Medication Assistance  Post Acute Care Choice:  Durable Medical Equipment Choice offered to:  Patient  DME Arranged:  3-N-1, Walker rolling DME Agency:  [place]:  NA HH Agency:     Status of Service:  Completed, signed off  Medicare Important Message Given:    Date Medicare IM Given:    Medicare IM give by:    Date Additional Medicare IM Given:    Additional Medicare Important Message give by:     If discussed at [place] of Stay Meetings, dates discussed:    Discharge Disposition: Home/ Self care (with pt to f/u with outpt pt)   Additional Comments:  12/03/15- 0900- Elizabeth Gibbons RN, BSN - referral for Lovenox benefits- per insurance check for 7days/14 syringes-   Pt copay will be $67- generic will be $13.67- prior auth not required ----- spoke with pt and spouse to share copay cost- if pt goes home with less syringes then cost may be less.  Per pt she uses [facility] in [place]- will call pharmacy and check to see if they have drug in stock-  DME has been ordered- spoke  with Jermaine with [facility]- RW and 3n1 to be delivered to room prior to discharge. PA to write script for pt for outpt PT.    Update-1115- pt's preferred [facility]- did not have drug in stock- per pt second pharmacy would be [facility] in [place]- call made to this pharmacy- for 100 mg syringes x 7- was informed by pharmacy that they do have drug in stock- pt made aware that [facility] [place] [facility] drug in stock.   Dawayne Patricia, RN 12/03/2015, 10:47 AM

## 2015-12-03 NOTE — Progress Notes (Signed)
   Daily Progress Note  Assessment/Planning: POD #4 s/p TE R pop, PT and AT arteries; R pop-pop bypass, R leg runoff for: thromboembolism popliteal artery and tibial arteries, likely chronic popliteal artery disease   Dressing to vein harvest site: drainage expected as the vein was subfascial and >4 cm deep  ABI demonstrated sym triphasic flow to both feet  Ambulation improved yesterday  Ok to D/C at this point on Lovenox bridge to Coumadin  Wound care discussed with patient  Follow up in office on 13 JAN 17 for staple removal  Subjective  - 4 Days Post-Op  Walked without difficulty yesterday  Objective Filed Vitals:   12/02/15 0533 12/02/15 1336 12/02/15 2105 12/03/15 0605  BP: 112/63 114/60 101/61 107/64  Pulse: 81 76 85 84  Temp: 99.1 F (37.3 C) 97.8 F (36.6 C) 99.5 F (37.5 C) 99.7 F (37.6 C)  TempSrc: Oral Oral Tympanic Oral  Resp: 187 18 18 18   Height:      Weight:      SpO2: 96% 100% 98% 97%    Intake/Output Summary (Last 24 hours) at 12/03/15 0921 Last data filed at 12/02/15 1800  Gross per 24 hour  Intake    480 ml  Output      0 ml  Net    480 ml    PULM  CTAB CV  RRR GI  soft, NTND VASC  R groin, AK and BK pop, and AT exposure incisions are c/d/i, vein harvest incision is draining serosanguinous fluid  Laboratory CBC    Component Value Date/Time   WBC 5.9 12/03/2015 0246   WBC 4.7 10/16/2015   WBC 3.9 12/08/2006 1042   HGB 11.1* 12/03/2015 0246   HGB 14.4 12/08/2006 1042   HCT 33.4* 12/03/2015 0246   HCT 41.7 12/08/2006 1042   PLT 115* 12/03/2015 0246   PLT 170 12/08/2006 1042    BMET    Component Value Date/Time   NA 143 12/01/2015 0432   NA 144 10/16/2015   K 3.9 12/01/2015 0432   CL 116* 12/01/2015 0432   CO2 22 12/01/2015 0432   GLUCOSE 97 12/01/2015 0432   GLUCOSE 79 11/19/2006 1355   BUN 9 12/01/2015 0432   BUN 12 10/16/2015   CREATININE 0.58 12/01/2015 0432   CREATININE 0.6 10/16/2015   CALCIUM 7.7* 12/01/2015 0432    GFRNONAA >60 12/01/2015 0432   GFRAA >60 12/01/2015 0432    12/02/15  ABI    RIGHT    LEFT    PRESSURE WAVEFORM  PRESSURE WAVEFORM  BRACHIAL 124 Tri BRACHIAL 122 Tri  DP   DP    AT 132 Tri AT 137 Tri  PT 129 Tri PT 142 Tri  PER   PER    GREAT TOE  NA GREAT TOE  NA    RIGHT LEFT  ABI 1.06 1.15          [name], MD [facility] [employer]: [phone] Pager: [phone]  12/03/2015, 9:21 AM

## 2015-12-03 NOTE — Progress Notes (Signed)
Occupational Therapy Treatment Patient Details Name: Elizabeth Douglas MRN: [id number] DOB: 04/12/1951 Today's Date: 12/03/2015    History of present illness Pt is a 65 y.o. F s/p Rt popliteal to anterior tibial and posterior tibial artery thrombectomy, Rt above knee to below knee popliteal artery bypass.  Pt's PMH includes osteopenia, atypical chest pain.   OT comments  Pt progressing and education provided to pt and spouse.   Follow Up Recommendations  No OT follow up;Supervision/Assistance - 24 hour    Equipment Recommendations  3 in 1 bedside comode    Recommendations for Other Services      Precautions / Restrictions Precautions Precautions: Fall Restrictions Weight Bearing Restrictions: No       Mobility Bed Mobility               General bed mobility comments: not assessed  Transfers Overall transfer level: Needs assistance   Transfers: Sit to/from Stand Sit to Stand: Supervision (Setup/Supervision)         General transfer comment: RW in front    Balance    No LOB in session. Used RW for ambulation.                               ADL Overall ADL's : Needs assistance/impaired                     Lower Body Dressing: Sitting/lateral leans;Modified independent Lower Body Dressing Details (indicate cue type and reason): donned/doffed right sock Toilet Transfer: Supervision/safety;Ambulation;RW (sit to stand from simulated regular height toilet)       Tub/ Shower Transfer: Walk-in shower;Min guard;Ambulation;Rolling walker;Shower seat   Functional mobility during ADLs: Rolling walker (Supervision-ambulation; Min guard-simulated shower transfer) General ADL Comments: Educated on LB dressing technique. educated on safety such as use of bag on walker, safe footwear, rugs/items on floor, and recommended someone be with her for shower transfer and bathing. Educated on shower transfer technique as pt plans to use walk-in shower at home.  Briefly talked about reacher and long handled sponge/brush. Discussed 3 in 1.      Vision                     Perception     Praxis      Cognition  Awake/Alert Behavior During Therapy: WFL for tasks assessed/performed Overall Cognitive Status: Within Functional Limits for tasks assessed                       Extremity/Trunk Assessment               Exercises     Shoulder Instructions       General Comments      Pertinent Vitals/ Pain       Pain Assessment: 0-10 Pain Score: 3  Pain Location: RLE Pain Descriptors / Indicators: Sore Pain Intervention(s): Repositioned;Monitored during session  Home Living                                          Prior Functioning/Environment              Frequency Min 3X/week     Progress Toward Goals  OT Goals(current goals can now be found in the care plan section)  Progress towards OT goals: Progressing toward goals  Acute Rehab OT Goals Patient Stated Goal: to take a bath OT Goal Formulation: With patient Time For Goal Achievement: 12/16/15 Potential to Achieve Goals: Good ADL Goals Pt Will Perform Grooming: with modified independence;standing Pt Will Perform Upper Body Bathing: with modified independence;sitting Pt Will Perform Lower Body Bathing: with modified independence;sit to/from stand Pt Will Perform Upper Body Dressing: with modified independence;sitting Pt Will Perform Lower Body Dressing: with modified independence;sit to/from stand Pt Will Transfer to Toilet: with modified independence;ambulating;regular height toilet;bedside commode Pt Will Perform Toileting - Clothing Manipulation and hygiene: with modified independence;sitting/lateral leans;sit to/from stand Pt Will Perform Tub/Shower Transfer: with modified independence;ambulating;shower seat;Tub transfer;Shower transfer;rolling walker  Plan Discharge plan remains appropriate    Co-evaluation                  End of Session Equipment Utilized During Treatment: Gait belt;Rolling walker   Activity Tolerance Patient tolerated treatment well   Patient Left in chair;with call bell/phone within reach;with family/visitor present   Nurse Communication          Time: [phone] (PA talked with pt for about 3 minutes) OT Time Calculation (min): 16 min  Charges: OT General Charges $OT Visit: 1 Procedure OT Treatments $Self Care/Home Management : 8-22 mins  Benito Mccreedy OTR/L [id number]  12/03/2015, 10:10 AM

## 2015-12-03 NOTE — Progress Notes (Signed)
ANTICOAGULATION CONSULT NOTE - Follow Up Consult  Pharmacy Consult for Lovenox/warfarin  Indication: Popliteal/Tibial thromboembolism   No Known Allergies  Patient Measurements: Height: 5\' 7"  (170.2 cm) Weight: 157 lb 6.5 oz (71.4 kg) IBW/kg (Calculated) : 61.6  Vital Signs: Temp: 99.7 F (37.6 C) (01/03 0605) Temp Source: Oral (01/03 0605) BP: 107/64 mmHg (01/03 0605) Pulse Rate: 84 (01/03 0605)  Labs:  Recent Labs  11/30/15 1700 12/01/15 0049  12/01/15 0432 12/02/15 0240 12/02/15 0344 12/03/15 0246  HGB  --   --   < > 10.4* 11.2*  --  11.1*  HCT  --   --   --  31.7* 34.6*  --  33.4*  PLT  --   --   --  87* 95*  --  115*  APTT 30  --   --  58*  --   --   --   LABPROT  --   --   --   --   --  15.4* 16.0*  INR  --   --   --   --   --  1.20 1.26  HEPARINUNFRC <0.10* <0.10*  --   --   --   --   --   CREATININE  --   --   --  0.58  --   --   --   < > = values in this interval not displayed.  Estimated Creatinine Clearance: 70.9 mL/min (by C-G formula based on Cr of 0.58).  Assessment: 37 yof continues on lovenox + warfarin for thromboembolism of R popliteal and tibial arteries. S/P thrombectomy 12/30. INR is subtherapeutic at 1.26. H/H is stable. Platelets are low but up from yesterday. No overt bleeding noted. Today is D#3 of overlap of warfarin + a parenteral anticoagulant.   Goal of Therapy:  INR 2-3 Monitor platelets by anticoagulation protocol: Yes   Plan:  - Continue lovenox 70mg  SQ Q12H - Increase warfarin to 7.5mg  PO x 1 tonight  - Daily INR and CBC  Salome Arnt, PharmD, BCPS Pager # [phone] 12/03/2015 8:00 AM

## 2015-12-04 ENCOUNTER — Other Ambulatory Visit: Payer: BLUE CROSS/BLUE SHIELD

## 2015-12-05 ENCOUNTER — Ambulatory Visit: Payer: BLUE CROSS/BLUE SHIELD | Admitting: Family Medicine

## 2015-12-06 ENCOUNTER — Encounter: Payer: Self-pay | Admitting: Family

## 2015-12-06 ENCOUNTER — Ambulatory Visit (INDEPENDENT_AMBULATORY_CARE_PROVIDER_SITE_OTHER): Payer: BLUE CROSS/BLUE SHIELD | Admitting: Family Medicine

## 2015-12-06 ENCOUNTER — Encounter: Payer: Self-pay | Admitting: Family Medicine

## 2015-12-06 VITALS — BP 94/70 | HR 71 | Temp 98.5°F | Wt 158.0 lb

## 2015-12-06 DIAGNOSIS — N939 Abnormal uterine and vaginal bleeding, unspecified: Secondary | ICD-10-CM | POA: Diagnosis not present

## 2015-12-06 DIAGNOSIS — I743 Embolism and thrombosis of arteries of the lower extremities: Secondary | ICD-10-CM

## 2015-12-06 DIAGNOSIS — I70201 Unspecified atherosclerosis of native arteries of extremities, right leg: Secondary | ICD-10-CM

## 2015-12-06 DIAGNOSIS — R791 Abnormal coagulation profile: Secondary | ICD-10-CM | POA: Diagnosis not present

## 2015-12-06 LAB — POCT INR: INR: 5.3

## 2015-12-06 NOTE — Patient Instructions (Signed)
STOP lovenox  Coumadin  Today- none Tomorrow- 5mg  Sunday- none  Monday- see Elizabeth Douglas here in our coumadin clinic If conditions are bad- see her on Tuesday but take 5mg  that evening.  Schedule visit for this

## 2015-12-06 NOTE — Progress Notes (Signed)
Garret Reddish, MD  Subjective:  Elizabeth Douglas is a 65 y.o. year old very pleasant female patient who presents for/with See problem oriented charting ROS- no chest pain, shortness of breath, no vaginal bleeding, no melena or BRBPR, no hemoptysis.   Past Medical History-  Patient Active Problem List   Diagnosis Date Noted  . Popliteal artery occlusion, right ([place]) 11/29/2015    Priority: High  . THROMBOCYTOPENIA, CHRONIC 11/13/2009    Priority: Medium  . Lumbar radiculopathy 11/20/2015    Priority: Low  . Gluteal tendinitis of left buttock 11/14/2015    Priority: Low  . Tensor fascia lata syndrome 10/08/2015    Priority: Low  . Hemorrhoids     Priority: Low  . Thrombosed external hemorrhoid 10/19/2014    Priority: Low  . Anal fissure - posterior 01/29/2014    Priority: Low  . CHEST PAIN 01/10/2009    Priority: Low  . Osteopenia 09/18/2008    Priority: Low  . GERD 07/10/2008    Priority: Low  . BENIGN POSITIONAL VERTIGO 01/18/2008    Priority: Low    Medications- reviewed and updated Current Outpatient Prescriptions  Medication Sig Dispense Refill  . acetaminophen (TYLENOL) 500 MG tablet Take 1,000 mg by mouth at bedtime.    . enoxaparin (LOVENOX) 100 MG/ML injection Inject 1 mL (100 mg total) into the skin daily. 7 Syringe 0  . omeprazole (PRILOSEC) 20 MG capsule TAKE ONE CAPSULE BY MOUTH TWICE DAILY 180 capsule 3  . tretinoin (RETIN-A) 0.05 % cream Apply 1 application topically at bedtime.     . Vitamin D, Ergocalciferol, (DRISDOL) 50000 units CAPS capsule Take 1 capsule (50,000 Units total) by mouth every 7 (seven) days. 8 capsule 0  . warfarin (COUMADIN) 5 MG tablet Take 1.5 tablets (7.5 mg) tonight 12/03/15 and 1.5 tablets tomorrow night 12/04/15. Then take 1 tablet (5 mg) daily. 30 tablet 1  . Wheat Dextrin (BENEFIBER) POWD Take 1 scoop by mouth daily.     No current facility-administered medications for this visit.    Objective: BP 94/70 mmHg  Pulse 71  Temp(Src)  98.5 F (36.9 C)  Wt 158 lb (71.668 kg)  Patient states at baseline BP runs lower Gen: NAD, resting comfortably CV: RRR no murmurs rubs or gallops Lungs: CTAB no crackles, wheeze, rhonchi Abdomen: soft/nontender/nondistended/normal bowel sounds. No rebound or guarding.  Ext: trace edema left, right leg with 1+ edema throughout. She has several well healing scars. Bruising noted posteriorly. No active bleeding Skin: warm, dry, no rash Neuro: grossly normal, moves all extremities  Assessment/Plan:  Popliteal artery occlusion, right ([facility]) Supratherapeutic INR Vaginal bleeding S: Patient presents for follow up afterpoopliteal-polpliteal bypass on the right after being found to have thomboembolism in politeal artery By Dr. Bridgett Larsson. She also required thrombectomy in 3 other locations (right popliteal, right posterior tibial, right anterior tibial. Symptoms started when patient noted tightness and pain in right calf and she was seeing Dr. Tamala Julian of sports medicine. She was sent for venous duplex and ABI and during duplex apparently the arterial clot was noted. Patient was emergently sent to the ER and within 6 hours had undergone surgery. Echocardiogram did not reveal cardioembolic source. She also had a CT angio chest/abdomen/pelvis which did not reveal a source. The other issue was that she had vaginal bleeding for first time in 14 years which fortunately stopped 12/01/15 without intervention and has GYN follow up. She was initially on heparin but before discharge transitioned to lovenox 100mg  daily along with  coumadin titration. Patient was discharged on 12/03/15 it appears though discharge summary is incomplete at this time. She had 5mg  of coumadin on 1/1 and 1/2. 7.5 on 1/3 and 1/4. Finally 5 mg on 1/5 for total of 30mg  over 5 days. She states she was told to follow up with PCP for INR management. Today, INR is 5.3.  A/P: Patient does have swelling throughout right leg but no worsening pain and likely due to  major surgery. Her wounds appear to be healing well with no signs of infection. She is to see vascular on 12/13/15 for staple removal and follow up. The main issue today is supratherapeutic INR. We considered vitamin K but with upcoming snow we are not sure if we will have close follow up. In addition, no signs of active bleeding. Strongly assume >24 hours overlap with therapeutic INR as well as on lovenox. We will stop lovenox bridge. I advised patient no coumadin today or Sunday but take a 5mg  dose Saturday evening and follow up with coumadin clinic Monday- if we are closed she is to take 5mg  that night and call on Tuesday. I will await Dr. Lianne Moris note but this episode is rather atypical including vaginal bleeding after 14 years at the same time of arterial occlusion with unidentifiable source and I would consider a hematology consult. Also has GYN follow up.   3-4 month wellness visit. INR managed through our coumadinclinic. Further management of current issues through vascular though I am happy to see patient for any concern and she is aware of this as sounds like she may have had genetic variant in vasculature leading to issue (once again discharge summary not complete at this time).   Would also ask for Dr. Lianne Moris recommendation on statin. She has very mild hyperlipidemia with LDL >100 and not sure if lowering lipids would benefit her with   Orders Placed This Encounter  Procedures  . POCT INR   Results for orders placed or performed in visit on 12/06/15 (from the past 24 hour(s))  POCT INR     Status: None   Collection Time: 12/06/15  4:02 PM  Result Value Ref Range   INR 5.3

## 2015-12-07 NOTE — Assessment & Plan Note (Addendum)
Supratherapeutic INR Vaginal bleeding S: Patient presents for follow up afterpoopliteal-polpliteal bypass on the right after being found to have thomboembolism in politeal artery By Dr. Bridgett Larsson. She also required thrombectomy in 3 other locations (right popliteal, right posterior tibial, right anterior tibial. Symptoms started when patient noted tightness and pain in right calf and she was seeing Dr. Tamala Julian of sports medicine. She was sent for venous duplex and ABI and during duplex apparently the arterial clot was noted. Patient was emergently sent to the ER and within 6 hours had undergone surgery. Echocardiogram did not reveal cardioembolic source. She also had a CT angio chest/abdomen/pelvis which did not reveal a source. The other issue was that she had vaginal bleeding for first time in 14 years which fortunately stopped 12/01/15 without intervention and has GYN follow up. She was initially on heparin but before discharge transitioned to lovenox 100mg  daily along with coumadin titration. Patient was discharged on 12/03/15 it appears though discharge summary is incomplete at this time. She had 5mg  of coumadin on 1/1 and 1/2. 7.5 on 1/3 and 1/4. Finally 5 mg on 1/5 for total of 30mg  over 5 days. She states she was told to follow up with PCP for INR management. Today, INR is 5.3.  A/P: Patient does have swelling throughout right leg but no worsening pain and likely due to major surgery. Her wounds appear to be healing well with no signs of infection. She is to see vascular on 12/13/15 for staple removal and follow up. The main issue today is supratherapeutic INR. We considered vitamin K but with upcoming snow we are not sure if we will have close follow up. In addition, no signs of active bleeding. Strongly assume >24 hours overlap with therapeutic INR as well as on lovenox. We will stop lovenox bridge. I advised patient no coumadin today or Sunday but take a 5mg  dose Saturday evening and follow up with coumadin clinic  Monday- if we are closed she is to take 5mg  that night and call on Tuesday. I will await Dr. Lianne Moris note but this episode is rather atypical including vaginal bleeding after 14 years at the same time of arterial occlusion with unidentifiable source and I would consider a hematology consult. Also has GYN follow up.

## 2015-12-09 ENCOUNTER — Ambulatory Visit (INDEPENDENT_AMBULATORY_CARE_PROVIDER_SITE_OTHER): Payer: BLUE CROSS/BLUE SHIELD | Admitting: General Practice

## 2015-12-09 ENCOUNTER — Ambulatory Visit: Payer: BLUE CROSS/BLUE SHIELD

## 2015-12-09 DIAGNOSIS — I70201 Unspecified atherosclerosis of native arteries of extremities, right leg: Secondary | ICD-10-CM

## 2015-12-09 DIAGNOSIS — I743 Embolism and thrombosis of arteries of the lower extremities: Secondary | ICD-10-CM

## 2015-12-09 LAB — POCT INR: INR: 2.5

## 2015-12-09 NOTE — Progress Notes (Signed)
Pre visit review using our clinic review tool, if applicable. No additional management support is needed unless otherwise documented below in the visit note.  This is a new coumadin patient. A full discussion of the nature of anticoagulants has been carried out.  A benefit risk analysis has been presented to the patient, so that they understand the justification for choosing anticoagulation at this time. The need for frequent and regular monitoring, precise dosage adjustment and compliance is stressed.  Side effects of potential bleeding are discussed.  The patient should avoid any OTC items containing aspirin or ibuprofen, and should avoid great swings in general diet.  Avoid alcohol consumption.  Call if any signs of abnormal bleeding.

## 2015-12-09 NOTE — Discharge Summary (Signed)
[facility] Discharge Summary  Elizabeth Douglas 16-Jan-1951 65 y.o. female  QE:[id number]  Admission Date: 11/29/2015  Discharge Date: 12/03/2015  Physician: [name], MD  Admission Diagnosis: Vaginal bleeding [N93.9] Right popliteal artery occlusion [facility]) [I74.3]  HPI:   This is a 65 y.o. female who states that on November 13, 2015, she was having pain in her left hip and went to see Dr. Tamala Julian and at that time, he injected her left hip, which relieved her symptoms. On 11/18/15, she woke up with her right foot numb. She states that she is having a hard time getting around, but before this event, she would walk 4-5 miles 4-5x/week. This was sudden in onset and she had not had this in the past. She states that she went back to Dr. Tamala Julian and was given 2 injections, which have not relieved her pain or numbness. She states that she was then sent for the ultrasound of her right leg and was told she had an arterial clot and was sent to the emergency room. She states that she feels there is a color difference in her legs and the right one turns white on the bottom after walking. She states it is worse to stand on it. She states that she has a broken blood vessel on the bottom of her right foot. She can still move her toes and foot. She states that she does have decreased sensation in the right foot.  She denies any history of irregular heartbeat. She states that she stopped drinking caffine 3 years ago and her heart fluttering did get better. She states that she rarely feels this now. She states that her mother was on blood thinners for hardening of the arteries, but did not know anymore than that.  She states that she has been having vaginal bleeding for a few days and does have an appointment with the GYN, however, she could not be seen until next week. She states that it has gotten heavier.  She does not smoke and never has smoked.  She states that she is pretty  healthy and the past couple of weeks have gotten to her mentally.  Hospital Course:  The patient was admitted to the hospital and taken to the operating room on 11/29/2015 - 11/30/2015 and underwent:  1. Right popliteal thrombectomy 2. Right posterior tibial artery thrombectomy  3. Right anterior tibial artery thrombectomy  4. Right above-the-knee to below-the-knee popliteal bypass with ipsilateral non-reverse greater saphenous vein  5. Open cannulation of right common femoral artery  6. Right leg runoff  FINDING(S): 1. Old organized thrombus in popliteal artery, posterior tibial artery and anterior tibial artery  2. Chronically occluded popliteal artery with heavy calcification 3. Mild calcification of above-the-knee and below-the-knee popliteal artery 4. Palpable anterior tibial artery at end of case with dopplerable posterior tibial artery  5. Soft calf with no evidence of bulging muscle The patient tolerated the procedure well and was transported to the PACU in stable condition.   POD 1: She had a marginal blood pressure overnight. She was started on a therapeutic dose of heparin without bolus to help thrombolysis of residual tibial thrombus. Her H/H was stable following two units of pRBCs overnight. Her thromboembolism workup was pending.   POD 2: She had a palpable dorsalis pedis pulse. Her heparin was transition to a lovenox bridge to coumadin. She was transferred to the floor.   POD 3: Her CTA chest/abd/pelvis and ECHO were unremarkable. These results suggested that she  had focal popliteal disease, either popliteal entrapment or cystic adventitial disease. It was recommended that she continue anticoagulation for 3 months. Her ABIs were normal.   POD 4: She had some drainage to her vein harvest site but the incision was clean. Her pain was well controlled and she was ambulating without difficulty. She was discharged home on POD 4 in good condition on a lovenox bridge to  coumadin. Her INR follow-up was arranged.  CBC    Component Value Date/Time   WBC 5.9 12/03/2015 0246   WBC 4.7 10/16/2015   WBC 3.9 12/08/2006 1042   RBC 3.54* 12/03/2015 0246   RBC 4.43 12/08/2006 1042   HGB 11.1* 12/03/2015 0246   HGB 14.4 12/08/2006 1042   HCT 33.4* 12/03/2015 0246   HCT 41.7 12/08/2006 1042   PLT 115* 12/03/2015 0246   PLT 170 12/08/2006 1042   MCV 94.4 12/03/2015 0246   MCV 94.0 12/08/2006 1042   MCH 31.4 12/03/2015 0246   MCH 32.5 12/08/2006 1042   MCHC 33.2 12/03/2015 0246   MCHC 34.6 12/08/2006 1042   RDW 13.6 12/03/2015 0246   RDW 12.5 12/08/2006 1042   LYMPHSABS 1.4 11/29/2015 1130   LYMPHSABS 1.5 12/08/2006 1042   MONOABS 0.4 11/29/2015 1130   MONOABS 0.2 12/08/2006 1042   EOSABS 0.1 11/29/2015 1130   EOSABS 0.1 12/08/2006 1042   BASOSABS 0.0 11/29/2015 1130   BASOSABS 0.0 12/08/2006 1042    BMET    Component Value Date/Time   NA 143 12/01/2015 0432   NA 144 10/16/2015   K 3.9 12/01/2015 0432   CL 116* 12/01/2015 0432   CO2 22 12/01/2015 0432   GLUCOSE 97 12/01/2015 0432   GLUCOSE 79 11/19/2006 1355   BUN 9 12/01/2015 0432   BUN 12 10/16/2015   CREATININE 0.58 12/01/2015 0432   CREATININE 0.6 10/16/2015   CALCIUM 7.7* 12/01/2015 0432   GFRNONAA >60 12/01/2015 0432   GFRAA >60 12/01/2015 0432     Discharge Instructions:   The patient is discharged to home with extensive instructions on wound care and progressive ambulation.  They are instructed not to drive or perform any heavy lifting until returning to see the physician in his office.  Discharge Instructions    Call MD for:  redness, tenderness, or signs of infection (pain, swelling, bleeding, redness, odor or green/yellow discharge around incision site)    Complete by:  As directed      Call MD for:  severe or increased pain, loss or decreased feeling  in affected limb(s)    Complete by:  As directed      Call MD for:  temperature >100.5    Complete by:  As directed       Discharge wound care:    Complete by:  As directed   Wash wounds daily with soap and water and pat dry. Place 4 x 4 gauze on incisions daily with tape while still having drainage.     Driving Restrictions    Complete by:  As directed   No driving for 2 weeks     Increase activity slowly    Complete by:  As directed   Walk with assistance use walker or cane as needed     Lifting restrictions    Complete by:  As directed   No lifting for 2 weeks     Resume previous diet    Complete by:  As directed  Discharge Diagnosis:  Vaginal bleeding [N93.9] Right popliteal artery occlusion [facility] [place] [facility]) [I74.3]  Secondary Diagnosis: Patient Active Problem List   Diagnosis Date Noted  . Popliteal artery occlusion, right ([place]) 11/29/2015  . Lumbar radiculopathy 11/20/2015  . Gluteal tendinitis of left buttock 11/14/2015  . Tensor fascia lata syndrome 10/08/2015  . Hemorrhoids   . Thrombosed external hemorrhoid 10/19/2014  . Anal fissure - posterior 01/29/2014  . THROMBOCYTOPENIA, CHRONIC 11/13/2009  . CHEST PAIN 01/10/2009  . Osteopenia 09/18/2008  . GERD 07/10/2008  . BENIGN POSITIONAL VERTIGO 01/18/2008   Past Medical History  Diagnosis Date  . Excess or deficiency of vitamin D   . Basal cell carcinoma   . Microhematuria   . GERD (gastroesophageal reflux disease)   . Osteopenia   . Atypical chest pain   . Thrombocytopenia ([place])   . Gallbladder polyp   . Anal fissure   . Hemorrhoids        Medication List    STOP taking these medications        gabapentin 100 MG capsule  Commonly known as:  NEURONTIN      TAKE these medications        acetaminophen 500 MG tablet  Commonly known as:  TYLENOL  Take 1,000 mg by mouth at bedtime.     BENEFIBER Powd  Take 1 scoop by mouth daily.     enoxaparin 100 MG/ML injection  Commonly known as:  LOVENOX  Inject 1 mL (100 mg total) into the skin daily.     omeprazole 20 MG capsule  Commonly known as:  PRILOSEC  TAKE ONE  CAPSULE BY MOUTH TWICE DAILY     tretinoin 0.05 % cream  Commonly known as:  RETIN-A  Apply 1 application topically at bedtime.     Vitamin D (Ergocalciferol) 50000 units Caps capsule  Commonly known as:  DRISDOL  Take 1 capsule (50,000 Units total) by mouth every 7 (seven) days.     warfarin 5 MG tablet  Commonly known as:  COUMADIN  Take 1.5 tablets (7.5 mg) tonight 12/03/15 and 1.5 tablets tomorrow night 12/04/15. Then take 1 tablet (5 mg) daily.        Disposition: home  Patient's condition: is Good  Follow up: 1. Dr. Bridgett Larsson in 4 weeks   Virgina Jock, PA-C [facility] [phone] 12/09/2015  7:56 PM  Discharge medications: Statin use:  no ASA use:  no Plavix use:  no Beta blocker use: no Coumadin use: yes   Addendum  I have independently interviewed and examined the patient, and I agree with the physician assistant's discharge summary.  This patient presented with subacute symptoms involving her right leg.  Her outpatient vascular labs were suggestive of embolism to right popliteal artery.  She was brought back to the OR for a thrombectomy, but this popliteal lesion was found to be chronic disease.  She had a AK to BK popliteal artery bypass with vein.  Her post-operative embolic work-up was negative, suggesting she had chronic popliteal artery disease.  She was maintained on anticoagulation with coumadin due persistence of tibial artery thrombus.  Her post-op ABI demonstrated resolution of tibial artery thrombosis.  She will follow up in the office in 2 weeks for wound check.  Will continue anticoagulation for 3 months.  [name], MD [facility] [place] Office: [phone] Pager: [phone]  12/12/2015, 7:08 AM

## 2015-12-10 ENCOUNTER — Other Ambulatory Visit: Payer: BLUE CROSS/BLUE SHIELD

## 2015-12-11 ENCOUNTER — Encounter: Payer: BLUE CROSS/BLUE SHIELD | Admitting: Internal Medicine

## 2015-12-13 ENCOUNTER — Ambulatory Visit (INDEPENDENT_AMBULATORY_CARE_PROVIDER_SITE_OTHER): Payer: BLUE CROSS/BLUE SHIELD | Admitting: Family

## 2015-12-13 ENCOUNTER — Encounter: Payer: Self-pay | Admitting: Family

## 2015-12-13 VITALS — BP 106/74 | HR 89 | Temp 97.9°F | Resp 16 | Ht 67.0 in | Wt 158.0 lb

## 2015-12-13 DIAGNOSIS — I779 Disorder of arteries and arterioles, unspecified: Secondary | ICD-10-CM

## 2015-12-13 DIAGNOSIS — Z95828 Presence of other vascular implants and grafts: Secondary | ICD-10-CM

## 2015-12-13 DIAGNOSIS — Z4802 Encounter for removal of sutures: Secondary | ICD-10-CM

## 2015-12-13 MED ORDER — CEPHALEXIN 500 MG PO CAPS: 500.0000 mg | ORAL_CAPSULE | Freq: Four times a day (QID) | ORAL | Status: DC

## 2015-12-13 NOTE — Progress Notes (Signed)
    Postoperative Visit   History of Present Illness  Elizabeth Douglas is a 65 y.o. year old female patient of Dr. Bridgett Larsson who is s/p   1. Right popliteal thrombectomy 2. Right posterior tibial artery thrombectomy  3. Right anterior tibial artery thrombectomy  4. Right above-the-knee to below-the-knee popliteal bypass with ipsilateral non-reverse greater saphenous vein  5. Open cannulation of right common femoral artery  6. Right leg runoff   on 11/30/15 for popliteal artery sub-acute occlusion. She returns today for staples removal.   All right leg incisions are well healed except right medial thigh incision (GSV harvest site) cellulitis surrounding the incision and staples.   The patient admits to not elevating her right foot above her knee when she does elevate her right leg, and has moderate right lower leg swelling as a consequence.  The patient is able to complete their activities of daily living.  The patient's current symptoms are: swelling in the right leg, minimal pain in the right leg. No drainage from incisions.   For VQI Use Only  PRE-ADM LIVING: Home  AMB STATUS: Ambulatory with walker  Physical Examination  Filed Vitals:   12/13/15 1002  BP: 106/74  Pulse: 89  Temp: 97.9 F (36.6 C)  TempSrc: Oral  Resp: 16  Height: 5\' 7"  (1.702 m)  Weight: 158 lb (71.668 kg)  SpO2: 97%   Body mass index is 24.74 kg/(m^2).  All right leg incisions are well healed with the exception of right medial thigh incision which has surrounding cellulitis. Bilateral DP pulses are 2+ palpable. Right leg has moderate swelling; pt instructed in proper elevation of right leg to minimize post operative swelling.   Medical Decision Making   Elizabeth Douglas is a 65 y.o. year old female who is s/p right above-the-knee to below-the-knee popliteal bypass with ipsilateral non-reverse greater saphenous vein, and several thrombectomies on 11/30/15 for popliteal artery sub-acute  occlusion.   The patient's bypass incisions are healing appropriately with resolution of pre-operative symptoms.  Both DP pulses are 2+ palpable.   Dr. Bridgett Larsson spoke with pt and husband and examined pt.  The right medial thigh GSV harvest site has cellulitis surrounding the incision.  All incision edges are well proximated.   All staples removed from right thigh medial incision, streri-strips applied.   Keflex 500 mg qid x 2 weeks, follow up with Dr. Bridgett Larsson in 2 weeks for wound check.    Thank you for allowing [place] to participate in this patient's care.  NICKEL, Sharmon Leyden, RN, MSN, FNP-C [facility] [place] Office: [phone]  12/13/2015, 10:22 AM  Clinic MD: Bridgett Larsson

## 2015-12-16 ENCOUNTER — Ambulatory Visit (INDEPENDENT_AMBULATORY_CARE_PROVIDER_SITE_OTHER): Payer: BLUE CROSS/BLUE SHIELD | Admitting: General Practice

## 2015-12-16 DIAGNOSIS — I70201 Unspecified atherosclerosis of native arteries of extremities, right leg: Secondary | ICD-10-CM

## 2015-12-16 DIAGNOSIS — I743 Embolism and thrombosis of arteries of the lower extremities: Secondary | ICD-10-CM | POA: Diagnosis not present

## 2015-12-16 LAB — POCT INR: INR: 2.2

## 2015-12-17 ENCOUNTER — Encounter: Payer: Self-pay | Admitting: Vascular Surgery

## 2015-12-18 ENCOUNTER — Encounter: Payer: BLUE CROSS/BLUE SHIELD | Admitting: Internal Medicine

## 2015-12-18 ENCOUNTER — Encounter: Payer: BLUE CROSS/BLUE SHIELD | Admitting: Family Medicine

## 2015-12-18 ENCOUNTER — Ambulatory Visit: Payer: BLUE CROSS/BLUE SHIELD | Admitting: Family Medicine

## 2015-12-23 ENCOUNTER — Ambulatory Visit (INDEPENDENT_AMBULATORY_CARE_PROVIDER_SITE_OTHER): Payer: BLUE CROSS/BLUE SHIELD | Admitting: General Practice

## 2015-12-23 DIAGNOSIS — I70201 Unspecified atherosclerosis of native arteries of extremities, right leg: Secondary | ICD-10-CM

## 2015-12-23 DIAGNOSIS — I743 Embolism and thrombosis of arteries of the lower extremities: Secondary | ICD-10-CM

## 2015-12-23 LAB — POCT INR: INR: 2

## 2015-12-23 NOTE — Progress Notes (Signed)
Pre visit review using our clinic review tool, if applicable. No additional management support is needed unless otherwise documented below in the visit note. 

## 2015-12-25 ENCOUNTER — Ambulatory Visit (INDEPENDENT_AMBULATORY_CARE_PROVIDER_SITE_OTHER): Payer: BLUE CROSS/BLUE SHIELD | Admitting: Vascular Surgery

## 2015-12-25 ENCOUNTER — Encounter: Payer: Self-pay | Admitting: Vascular Surgery

## 2015-12-25 ENCOUNTER — Other Ambulatory Visit: Payer: Self-pay

## 2015-12-25 VITALS — BP 98/68 | HR 77 | Temp 98.9°F | Resp 16 | Ht 67.0 in | Wt 157.0 lb

## 2015-12-25 DIAGNOSIS — I70201 Unspecified atherosclerosis of native arteries of extremities, right leg: Secondary | ICD-10-CM

## 2015-12-25 DIAGNOSIS — I743 Embolism and thrombosis of arteries of the lower extremities: Secondary | ICD-10-CM

## 2015-12-25 DIAGNOSIS — Z5189 Encounter for other specified aftercare: Secondary | ICD-10-CM

## 2015-12-25 NOTE — Progress Notes (Signed)
    Postoperative Visit   History of Present Illness  Elizabeth Douglas is a 65 y.o. year old female who presents for postoperative follow-up for:  1. Right popliteal thrombectomy 2. Right posterior tibial artery thrombectomy  3. Right anterior tibial artery thrombectomy  4. Right above-the-knee to below-the-knee popliteal bypass with ipsilateral non-reverse greater saphenous vein  5. Open cannulation of right common femoral artery  6. Right leg runoff (Date: 11/30/15).    The patient's wounds are nearly healed.  The patient notes resolution of lower extremity symptoms.  The patient is able to complete their activities of daily living.  The patient's current symptoms are: swelling right leg.  For VQI Use Only  PRE-ADM LIVING: Home  AMB STATUS: Ambulatory  Physical Examination  Filed Vitals:   12/25/15 1609  BP: 98/68  Pulse: 77  Temp: 98.9 F (37.2 C)  Resp: 16   LLE: Right groin incision healed, dermabond still in place; vein harvest incision near healed with steristrips still in place, Above and below knee exposure incision healed with limited black eschar still adherent, AT exposure incision healed with small eschar in [place] is a 65 y.o. year old female who presents s/p R pop and PTA TE, R AT TE, R AK to BK pop bypass with ips NR GSV for at-knee popliteal occlusion of unknown etiology, tibial artery thromboembolism   The patient's bypass incisions are healing appropriately with resolution of pre-operative symptoms. I discussed in depth with the patient the nature of atherosclerosis, and emphasized the importance of maximal medical management including strict control of blood pressure, blood glucose, and lipid levels, obtaining regular exercise, and cessation of smoking.  The patient is aware that without maximal medical management the underlying atherosclerotic disease process will progress, limiting the benefit of any  interventions. The patient's surveillance will included ABI and bypass duplex studies which will be completed in: 3 months, at which time the patient will be re-evaluated.   I emphasized the importance of routine surveillance of the patient's bypass, as the vascular surgery literature emphasize the improved patency possible with assisted primary patency procedures versus secondary patency procedures. The patient agrees to participate in their maximal medical care and routine surveillance. Would continue anticoagulation for 3 months total to give her the best chance at lysing her tibial thrombus.  Coumadin can be discontinued on 02/27/16.  Thank you for allowing [place] to participate in this patient's care.  [name], MD [facility] [place] Office: [phone] Pager: [phone]  12/25/2015, 4:42 PM

## 2015-12-30 ENCOUNTER — Ambulatory Visit: Payer: BLUE CROSS/BLUE SHIELD

## 2015-12-30 ENCOUNTER — Ambulatory Visit (INDEPENDENT_AMBULATORY_CARE_PROVIDER_SITE_OTHER): Payer: BLUE CROSS/BLUE SHIELD | Admitting: General Practice

## 2015-12-30 DIAGNOSIS — I743 Embolism and thrombosis of arteries of the lower extremities: Secondary | ICD-10-CM | POA: Diagnosis not present

## 2015-12-30 DIAGNOSIS — I70201 Unspecified atherosclerosis of native arteries of extremities, right leg: Secondary | ICD-10-CM

## 2015-12-30 LAB — POCT INR: INR: 2.5

## 2015-12-30 NOTE — Progress Notes (Signed)
Pre visit review using our clinic review tool, if applicable. No additional management support is needed unless otherwise documented below in the visit note. 

## 2016-01-09 ENCOUNTER — Ambulatory Visit: Payer: BLUE CROSS/BLUE SHIELD | Attending: Vascular Surgery | Admitting: Physical Therapy

## 2016-01-09 ENCOUNTER — Encounter: Payer: Self-pay | Admitting: Physical Therapy

## 2016-01-09 ENCOUNTER — Other Ambulatory Visit: Payer: Self-pay | Admitting: *Deleted

## 2016-01-09 DIAGNOSIS — M79604 Pain in right leg: Secondary | ICD-10-CM | POA: Insufficient documentation

## 2016-01-09 DIAGNOSIS — R531 Weakness: Secondary | ICD-10-CM | POA: Diagnosis present

## 2016-01-09 DIAGNOSIS — M25661 Stiffness of right knee, not elsewhere classified: Secondary | ICD-10-CM

## 2016-01-09 DIAGNOSIS — R29898 Other symptoms and signs involving the musculoskeletal system: Secondary | ICD-10-CM | POA: Diagnosis present

## 2016-01-09 NOTE — Therapy (Addendum)
[facility] [place], [place], [address] Phone: [phone]   Fax:  [phone]  Physical Therapy Evaluation  Patient Details  Name: Elizabeth Douglas MRN: [id number] Date of Birth: 1951-03-31 Referring Provider: [name], MD  Encounter Date: 01/09/2016      PT End of Session - 01/09/16 1355    Visit Number 1   Number of Visits 4   Date for PT Re-Evaluation 02/20/16   PT Start Time 1310   PT Stop Time 1355   PT Time Calculation (min) 45 min   Activity Tolerance Patient tolerated treatment well   Behavior During Therapy [facility] for tasks assessed/performed      Past Medical History  Diagnosis Date  . Excess or deficiency of vitamin D   . Basal cell carcinoma   . Microhematuria   . GERD (gastroesophageal reflux disease)   . Osteopenia   . Atypical chest pain   . Thrombocytopenia ([place])   . Gallbladder polyp   . Anal fissure   . Hemorrhoids     Past Surgical History  Procedure Laterality Date  . Lung biopsy    . Colonoscopy  2005, 2015  . Upper gastrointestinal endoscopy  2009  . Thrombectomy femoral artery Right 11/29/2015    Procedure:  RIGHT  POPLITEAL to anterior tibial and posterior tibial ARTERY thrombectomy. Right above knee to below knee popliteal artery bypass;  Surgeon: Conrad Freedom, MD;  Location: [facility];  Service: Vascular;  Laterality: Right;  . Intraoperative arteriogram Right 11/29/2015    Procedure: INTRA OPERATIVE ARTERIOGRAM;  Surgeon: Conrad , MD;  Location: [facility];  Service: Vascular;  Laterality: Right;    There were no vitals filed for this visit.  Visit Diagnosis:  Right leg pain - Plan: PT plan of care cert/re-cert  Weakness - Plan: PT plan of care cert/re-cert  Decreased ROM of right knee - Plan: PT plan of care cert/re-cert      Subjective Assessment - 01/09/16 1311    Subjective Patient presents with c/o R lower leg pain and pain at incision following arterial surgery 11/29/15  affecting ADLS. She also reports tightness and swelling mainly from knee to ankle. She reports that she is presently not at risk for other clots.   Pertinent History popliteal artery occlusion 11/29/15   How long can you walk comfortably? 15 min  power walk 35 mn 2-2.5 miles PLOF   Patient Stated Goals improve movement and strength   Currently in Pain? Yes   Pain Score 1    Pain Location Leg   Pain Orientation Right;Lower   Pain Descriptors / Indicators Tightness   Pain Type Surgical pain   Pain Onset More than a month ago   Pain Frequency Intermittent   Aggravating Factors  walk or exercise   Pain Relieving Factors sitting   Effect of Pain on Daily Activities limited with walking and cannot get off floor.            [facility] PT Assessment - 01/09/16 0001    Assessment   Medical Diagnosis Pain in R lower leg (F81.829)   Referring Provider [name] MD   Onset Date/Surgical Date 11/29/15   Next MD Visit 04/01/16   Precautions   Precautions None   Balance Screen   Has the patient fallen in the past 6 months No   Has the patient had a decrease in activity level because of a fear of falling?  No   Is the patient  reluctant to leave their home because of a fear of falling?  No   Home Environment   Additional Comments stairs at home, but no problem   Prior Function   Level of Independence Independent   Observation/Other Assessments-Edema    Edema Circumferential;Figure 8   Circumferential Edema   Circumferential - Right 25.5  measured 5 cm above lateral malleolus   Circumferential - Left  23.5  At knee R 42 cm/L 40 cm; mid thigh R 54 cm; L 52 cm   Figure 8 Edema   Figure 8 - Right  49.5 cm   Figure 8 - Left  49 cm   Functional Tests   Functional tests Floor to Stand  unable to perform   ROM / Strength   AROM / PROM / Strength AROM;Strength   AROM   Overall AROM Comments R ankle DF -4/5 deg A/PROM   AROM Assessment Site Knee   Right/Left Knee Right   Right Knee Flexion  112  133 passive   Strength   Overall Strength Comments unable to bend knee up to full range'   Strength Assessment Site Hip;Knee   Right/Left Hip Right   Right Hip Flexion 4+/5   Right Hip Extension 4+/5   Right Hip External Rotation  4-/5   Right Hip Internal Rotation 4-/5   Right Hip ABduction 4+/5   Right/Left Knee Right   Right Knee Flexion 4-/5   Right Knee Extension --  5-/5                           PT Education - 01/09/16 1521    Education provided Yes   Education Details HEP; also reviewed current HEP   Person(s) Educated Patient   Methods Explanation;Demonstration;Handout   Comprehension Returned demonstration;Verbalized understanding;Need further instruction             PT Long Term Goals - 01/09/16 1530    PT LONG TERM GOAL #1   Title I with HEP   Time 6   Period Weeks   Status New   PT LONG TERM GOAL #2   Title patient able to rise from floor to stand I   Time 6   Period Weeks   Status New   PT LONG TERM GOAL #3   Title patient able to walk at self reported PLOF.   Time 6   Period Weeks   Status New   PT LONG TERM GOAL #4   Title No pain with ADLS   Time 6   Period Weeks   Status New               Plan - 01/09/16 1404    Clinical Impression Statement Patient presents s/p arterial surgery on her R lower leg 11/29/15. She has decreased R knee and ankle ROM and decreased strength affecting ADLs including being unable to rise from floor which she needs to do since she exericses on the floor at times. Additionally, she is unable to walk at her normal gait speed or work out speed at this time.    Pt will benefit from skilled therapeutic intervention in order to improve on the following deficits Impaired flexibility;Decreased range of motion;Pain;Decreased activity tolerance;Decreased strength;Increased edema   Rehab Potential Excellent   PT Frequency 1x / week   PT Duration 6 weeks   PT Treatment/Interventions Therapeutic  exercise;Neuromuscular re-education;Patient/family education;Manual techniques;Passive range of motion   PT Next Visit Plan Functional strengthening (components  for floor to stand transfer ie lunges/squats), hip/knee/ankle. Manual eduation prn for edema management.   PT Home Exercise Plan HS, gastroc/soleus stretch, heel slide for knee ROM   Consulted and Agree with Plan of Care Patient         Problem List Patient Active Problem List   Diagnosis Date Noted  . Popliteal artery occlusion, right ([place]) 11/29/2015  . Lumbar radiculopathy 11/20/2015  . Gluteal tendinitis of left buttock 11/14/2015  . Tensor fascia lata syndrome 10/08/2015  . Hemorrhoids   . Thrombosed external hemorrhoid 10/19/2014  . Anal fissure - posterior 01/29/2014  . THROMBOCYTOPENIA, CHRONIC 11/13/2009  . CHEST PAIN 01/10/2009  . Osteopenia 09/18/2008  . GERD 07/10/2008  . BENIGN POSITIONAL VERTIGO 01/18/2008   Madelyn Flavors PT  01/09/2016, 3:43 PM  [facility] [address] [place], [place], [address] Phone: [phone]   Fax:  [phone]  Name: BRAYLEA BRANCATO MRN: [id number] Date of Birth: Apr 22, 1951  PHYSICAL THERAPY DISCHARGE SUMMARY  Visits from Start of Care: 1.  Current functional level related to goals / functional outcomes: See above.   Remaining deficits: See below.   Education / Equipment: HEP. Plan: Patient agrees to discharge.  Patient goals were not met. Patient is being discharged due to not returning since the last visit.  ?????         Mali Applegate MPT

## 2016-01-09 NOTE — Patient Instructions (Signed)
   Elizabeth Douglas, PT 01/09/2016 3:21 PM [facility] [address] [place], [place], [address] Phone: [phone]   Fax:  [phone]

## 2016-01-13 ENCOUNTER — Ambulatory Visit (INDEPENDENT_AMBULATORY_CARE_PROVIDER_SITE_OTHER): Payer: BLUE CROSS/BLUE SHIELD | Admitting: General Practice

## 2016-01-13 DIAGNOSIS — I70201 Unspecified atherosclerosis of native arteries of extremities, right leg: Secondary | ICD-10-CM

## 2016-01-13 DIAGNOSIS — I743 Embolism and thrombosis of arteries of the lower extremities: Secondary | ICD-10-CM

## 2016-01-13 LAB — POCT INR: INR: 2.3

## 2016-01-13 NOTE — Progress Notes (Signed)
I agree with this plan.

## 2016-01-13 NOTE — Progress Notes (Signed)
Pre visit review using our clinic review tool, if applicable. No additional management support is needed unless otherwise documented below in the visit note. 

## 2016-01-27 ENCOUNTER — Telehealth: Payer: Self-pay

## 2016-01-27 DIAGNOSIS — R200 Anesthesia of skin: Secondary | ICD-10-CM

## 2016-01-27 DIAGNOSIS — M79604 Pain in right leg: Secondary | ICD-10-CM

## 2016-01-27 DIAGNOSIS — Z95828 Presence of other vascular implants and grafts: Secondary | ICD-10-CM

## 2016-01-27 NOTE — Telephone Encounter (Signed)
Phone call from pt.  Reported she has had pain behind the right knee and numbness in sole of right foot, after walking approx. 0.2 miles.  Stated the discomfort is similar to symptoms prior to the thrombectomy in December.  Reported the discomfort only occurs with walking.  Reported the numbness in sole right foot, feels like she is "walking on pebbles."  Does notice the discomfort eases up after rest.  Reported she continues to have swelling in the right ankle and just above right ankle, and some swelling behind the right knee.  Stated the swelling is not worse, but has never resolved after surgery.  Denied any decreased mobility with right lower extremity.  Reported she remains on Coumadin until March 30th, per MD orders.   Will report symptoms to Dr. Bridgett Larsson for further recommendations.  Agreed.

## 2016-01-27 NOTE — Telephone Encounter (Signed)
RE: recommendation  Received: Today    Conrad Magnolia, MD  Joline Salt Patsye Sullivant, RN           She needs arterial duplex to see if bypass is occluded.      Will contact pt. With appt. for right LE art. Duplex and office visit.

## 2016-01-28 NOTE — Telephone Encounter (Signed)
Spoke with pt and offered LAB and NP appt this week. However, pt would prefer to wait and see [facility] on Friday. Scheduled as pt requested. She is scheduled for the labs on Wednesday 03/01 and to see [place] on 01/31/2016 dpm

## 2016-01-29 ENCOUNTER — Ambulatory Visit (INDEPENDENT_AMBULATORY_CARE_PROVIDER_SITE_OTHER)
Admission: RE | Admit: 2016-01-29 | Discharge: 2016-01-29 | Disposition: A | Discharge status: Home / Self Care | Payer: BLUE CROSS/BLUE SHIELD | Source: Ambulatory Visit | Attending: Vascular Surgery | Admitting: Vascular Surgery

## 2016-01-29 ENCOUNTER — Ambulatory Visit (HOSPITAL_COMMUNITY)
Admission: RE | Admit: 2016-01-29 | Discharge: 2016-01-29 | Disposition: A | Discharge status: Home / Self Care | Payer: BLUE CROSS/BLUE SHIELD | Source: Ambulatory Visit | Attending: Vascular Surgery | Admitting: Vascular Surgery

## 2016-01-29 DIAGNOSIS — M79604 Pain in right leg: Secondary | ICD-10-CM | POA: Insufficient documentation

## 2016-01-29 DIAGNOSIS — Z95828 Presence of other vascular implants and grafts: Secondary | ICD-10-CM | POA: Insufficient documentation

## 2016-01-29 DIAGNOSIS — R208 Other disturbances of skin sensation: Secondary | ICD-10-CM

## 2016-01-29 DIAGNOSIS — Z5189 Encounter for other specified aftercare: Secondary | ICD-10-CM | POA: Diagnosis not present

## 2016-01-29 DIAGNOSIS — R0989 Other specified symptoms and signs involving the circulatory and respiratory systems: Secondary | ICD-10-CM | POA: Diagnosis not present

## 2016-01-29 DIAGNOSIS — R200 Anesthesia of skin: Secondary | ICD-10-CM

## 2016-01-30 ENCOUNTER — Ambulatory Visit: Payer: BLUE CROSS/BLUE SHIELD | Admitting: Family

## 2016-01-30 ENCOUNTER — Encounter: Payer: Self-pay | Admitting: Vascular Surgery

## 2016-01-31 ENCOUNTER — Ambulatory Visit (INDEPENDENT_AMBULATORY_CARE_PROVIDER_SITE_OTHER): Payer: Self-pay | Admitting: Vascular Surgery

## 2016-01-31 ENCOUNTER — Encounter: Payer: Self-pay | Admitting: Vascular Surgery

## 2016-01-31 VITALS — BP 120/80 | HR 76 | Temp 98.3°F | Resp 16 | Ht 67.0 in | Wt 156.0 lb

## 2016-01-31 DIAGNOSIS — I70201 Unspecified atherosclerosis of native arteries of extremities, right leg: Secondary | ICD-10-CM

## 2016-01-31 DIAGNOSIS — I743 Embolism and thrombosis of arteries of the lower extremities: Secondary | ICD-10-CM

## 2016-01-31 NOTE — Progress Notes (Signed)
    Postoperative Visit   History of Present Illness  Elizabeth Douglas is a 65 y.o. ([birth year]) female  who presents for postoperative follow-up for:  1. Right popliteal thrombectomy 2. Right posterior tibial artery thrombectomy  3. Right anterior tibial artery thrombectomy  4. Right above-the-knee to below-the-knee popliteal bypass with ipsilateral non-reverse greater saphenous vein  5. Open cannulation of right common femoral artery  6. Right leg runoff (Date: 11/30/15).  The patient returns with recurrence of sx: R calf cramping with walking.  For VQI Use Only  PRE-ADM LIVING: Home  AMB STATUS: Ambulatory  Physical Examination  Filed Vitals:   01/31/16 1440  BP: 120/80  Pulse: 76  Temp: 98.3 F (36.8 C)  TempSrc: Oral  Resp: 16  Height: 5\' 7"  (1.702 m)  Weight: 156 lb (70.761 kg)  SpO2: 98%    RLE: incs all healed, 1+ edema, warm foot with palpable pulses   ABI (Date: 01/29/16)  R:   ABI: 1.28 (1.06),   DP: tri,   PT: tri,   TBI: 0.87  L:   ABI: 1.29 (1.15),   DP: tri,   PT: tri,   TBI: 0.94   RLE arterial duplex: patent R pop-pop bypass with triphasic flow   Medical Decision Making  Elizabeth Douglas is a 65 y.o. 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  I doubt this is vasculogenic claudication given the excellent blood flow.  She has follow up studies this coming month.  Thank you for allowing [place] to participate in this patient's care.   [name], MD, FACS [facility] Office: [phone] Pager: [phone]  01/31/2016, 12:13 AM

## 2016-02-03 ENCOUNTER — Ambulatory Visit (INDEPENDENT_AMBULATORY_CARE_PROVIDER_SITE_OTHER): Payer: BLUE CROSS/BLUE SHIELD | Admitting: General Practice

## 2016-02-03 ENCOUNTER — Ambulatory Visit: Payer: BLUE CROSS/BLUE SHIELD

## 2016-02-03 ENCOUNTER — Other Ambulatory Visit: Payer: Self-pay | Admitting: General Practice

## 2016-02-03 DIAGNOSIS — I743 Embolism and thrombosis of arteries of the lower extremities: Secondary | ICD-10-CM

## 2016-02-03 DIAGNOSIS — I70201 Unspecified atherosclerosis of native arteries of extremities, right leg: Secondary | ICD-10-CM

## 2016-02-03 LAB — POCT INR: INR: 1.9

## 2016-02-03 MED ORDER — WARFARIN SODIUM 5 MG PO TABS
ORAL_TABLET | ORAL | Status: DC
Start: 2016-02-03 — End: 2016-04-22

## 2016-02-03 NOTE — Progress Notes (Signed)
Pre visit review using our clinic review tool, if applicable. No additional management support is needed unless otherwise documented below in the visit note. 

## 2016-03-03 ENCOUNTER — Ambulatory Visit: Payer: Self-pay | Admitting: General Practice

## 2016-03-03 DIAGNOSIS — I70201 Unspecified atherosclerosis of native arteries of extremities, right leg: Secondary | ICD-10-CM

## 2016-03-27 ENCOUNTER — Encounter: Payer: Self-pay | Admitting: Vascular Surgery

## 2016-04-02 ENCOUNTER — Other Ambulatory Visit: Payer: Self-pay | Admitting: *Deleted

## 2016-04-02 DIAGNOSIS — I739 Peripheral vascular disease, unspecified: Secondary | ICD-10-CM

## 2016-04-02 DIAGNOSIS — Z48812 Encounter for surgical aftercare following surgery on the circulatory system: Secondary | ICD-10-CM

## 2016-04-02 NOTE — Progress Notes (Signed)
Established Previous Bypass   History of Present Illness  AHMIRA TRILLO is a 65 y.o. (Nov 04, 1951) female who presents with chief complaint: routine follow-up.  Previous operation(s) include:  1. Right popliteal thrombectomy 2. Right posterior tibial artery thrombectomy  3. Right anterior tibial artery thrombectomy  4. Right above-the-knee to below-the-knee popliteal bypass with ipsilateral non-reverse greater saphenous vein  5. Open cannulation of right common femoral artery  6. Right leg runoff (Date: 11/30/15).  The patient's symptoms have improved.  The patient's symptoms are: mild numbness in above-the-knee and below-the-knee incisions.  Resolution of prior calf pain right leg.  She does note hip pain bilaterally that develops after 2 mi of walking. The patient's treatment regimen currently included: maximal medical management and walking.  The patient's PMH, PSH, SH, and FamHx are unchanged from 01/31/16.  Current Outpatient Prescriptions  Medication Sig Dispense Refill  . acetaminophen (TYLENOL) 500 MG tablet Take 1,000 mg by mouth at bedtime.    . Cholecalciferol (VITAMIN D3) 2000 units capsule Take 2,000 Units by mouth daily.    Marland Kitchen gabapentin (NEURONTIN) 100 MG capsule Reported on 01/31/2016    . omeprazole (PRILOSEC) 20 MG capsule TAKE ONE CAPSULE BY MOUTH TWICE DAILY 180 capsule 3  . tretinoin (RETIN-A) 0.05 % cream Apply 1 application topically at bedtime.     . Vitamin D, Ergocalciferol, (DRISDOL) 50000 units CAPS capsule Take 1 capsule (50,000 Units total) by mouth every 7 (seven) days. (Patient not taking: Reported on 01/31/2016) 8 capsule 0  . warfarin (COUMADIN) 5 MG tablet Take as directed by anticoagulation clinic 30 tablet 0  . Wheat Dextrin (BENEFIBER) POWD Take 1 scoop by mouth daily.     No current facility-administered medications for this visit.    No Known Allergies  On ROS today: no intermittent claudication , no rest pain   Physical  Examination  Filed Vitals:   04/03/16 1349  BP: 115/75  Pulse: 72  Temp: 97.7 F (36.5 C)  Resp: 16  Height: 5\' 7"  (1.702 m)  Weight: 153 lb (69.4 kg)  SpO2: 99%   Body mass index is 23.96 kg/(m^2).  General: A&O x 3, WDWN  Pulmonary: Sym exp, good air movt, CTAB, no rales, rhonchi, & wheezing  Cardiac: RRR, Nl S1, S2, no Murmurs, rubs or gallops  Vascular: Vessel Right Left  Radial Palpable Palpable  Brachial Palpable Palpable  Carotid Palpable, without bruit Palpable, without bruit  Aorta Not palpable N/A  Femoral Palpable Palpable  Popliteal Not palpable Not palpable  PT Palpable Palpable  DP Faintly Palpable Palpable   Gastrointestinal: soft, NTND, no G/R, no HSM, no masses, no CVAT B  Musculoskeletal: M/S 5/5 throughout , Extremities without ischemic changes , incisions are healed  Neurologic: Pain and light touch intact in extremities , Motor exam as listed above   Non-Invasive Vascular Imaging ABI (Date: 04/02/2016)  R:   ABI: 1.15 (1.28),   AT: not detected  Peroneal: tri  PT: tri,   TBI: not done  L:   ABI: 1.18 (1.29),   DP: tri,   PT: tri,   TBI: not done  Bypass Duplex (Date: 04/02/2016)  Triphasic throughout  Proximal to bypass: 76 c/s  With-in bypass: 32-55 c/s  Distal to bypass: 91 c/s   Medical Decision Making  JESSICAANN KOZYRA is a 65 y.o. female who presents with:  s/p R pop, PT, and AT TE, R AK to BK pop bypass for R pop occlusion of unknown etiology (possible atypical  entrapment)   Based on the patient's vascular studies and examination, I have offered the patient: BLE ABI and bypass duplex in 6 months.  I discussed in depth with the patient the nature of atherosclerosis, and emphasized the importance of maximal medical management including strict control of blood pressure, blood glucose, and lipid levels, obtaining regular exercise, and cessation of smoking.    The patient is aware that without maximal medical  management the underlying atherosclerotic disease process will progress, limiting the benefit of any interventions.  I discussed in depth with the patient the need for long term surveillance to improve the primary assisted patency of his bypass.  The patient agrees to cooperate with such. The patient is currently not on a statin: as not medically indicated.  The patient is currently not on an anti-platelet.  The patient will be started on ASA 81 mg PO daily after she completes her hemorrhoidectomy.  Thank you for allowing [place] to participate in this patient's care.   [name], MD [facility] Office: [phone] Pager: [phone]  04/02/2016, 8:04 PM

## 2016-04-03 ENCOUNTER — Encounter (HOSPITAL_COMMUNITY): Payer: BLUE CROSS/BLUE SHIELD

## 2016-04-03 ENCOUNTER — Ambulatory Visit (HOSPITAL_COMMUNITY)
Admission: RE | Admit: 2016-04-03 | Discharge: 2016-04-03 | Disposition: A | Discharge status: Home / Self Care | Payer: BLUE CROSS/BLUE SHIELD | Source: Ambulatory Visit | Attending: Vascular Surgery | Admitting: Vascular Surgery

## 2016-04-03 ENCOUNTER — Ambulatory Visit: Payer: BLUE CROSS/BLUE SHIELD | Admitting: Vascular Surgery

## 2016-04-03 ENCOUNTER — Ambulatory Visit (INDEPENDENT_AMBULATORY_CARE_PROVIDER_SITE_OTHER)
Admission: RE | Admit: 2016-04-03 | Discharge: 2016-04-03 | Disposition: A | Discharge status: Home / Self Care | Payer: BLUE CROSS/BLUE SHIELD | Source: Ambulatory Visit | Attending: Vascular Surgery | Admitting: Vascular Surgery

## 2016-04-03 ENCOUNTER — Encounter: Payer: Self-pay | Admitting: Vascular Surgery

## 2016-04-03 ENCOUNTER — Ambulatory Visit (INDEPENDENT_AMBULATORY_CARE_PROVIDER_SITE_OTHER): Payer: BLUE CROSS/BLUE SHIELD | Admitting: Vascular Surgery

## 2016-04-03 VITALS — BP 115/75 | HR 72 | Temp 97.7°F | Resp 16 | Ht 67.0 in | Wt 153.0 lb

## 2016-04-03 DIAGNOSIS — I743 Embolism and thrombosis of arteries of the lower extremities: Secondary | ICD-10-CM

## 2016-04-03 DIAGNOSIS — I739 Peripheral vascular disease, unspecified: Secondary | ICD-10-CM

## 2016-04-03 DIAGNOSIS — Z5189 Encounter for other specified aftercare: Secondary | ICD-10-CM

## 2016-04-03 DIAGNOSIS — R0989 Other specified symptoms and signs involving the circulatory and respiratory systems: Secondary | ICD-10-CM | POA: Insufficient documentation

## 2016-04-03 DIAGNOSIS — Z48812 Encounter for surgical aftercare following surgery on the circulatory system: Secondary | ICD-10-CM | POA: Diagnosis not present

## 2016-04-03 DIAGNOSIS — K219 Gastro-esophageal reflux disease without esophagitis: Secondary | ICD-10-CM | POA: Insufficient documentation

## 2016-04-03 DIAGNOSIS — I70201 Unspecified atherosclerosis of native arteries of extremities, right leg: Secondary | ICD-10-CM

## 2016-04-15 ENCOUNTER — Other Ambulatory Visit (INDEPENDENT_AMBULATORY_CARE_PROVIDER_SITE_OTHER): Payer: BLUE CROSS/BLUE SHIELD

## 2016-04-15 DIAGNOSIS — Z0000 Encounter for general adult medical examination without abnormal findings: Secondary | ICD-10-CM

## 2016-04-15 LAB — HEPATIC FUNCTION PANEL
ALT: 10 U/L (ref 0–35)
AST: 18 U/L (ref 0–37)
Albumin: 4.1 g/dL (ref 3.5–5.2)
Alkaline Phosphatase: 46 U/L (ref 39–117)
BILIRUBIN DIRECT: 0.1 mg/dL (ref 0.0–0.3)
TOTAL PROTEIN: 6.4 g/dL (ref 6.0–8.3)
Total Bilirubin: 0.6 mg/dL (ref 0.2–1.2)

## 2016-04-15 LAB — BASIC METABOLIC PANEL
BUN: 11 mg/dL (ref 6–23)
CHLORIDE: 104 meq/L (ref 96–112)
CO2: 33 mEq/L — ABNORMAL HIGH (ref 19–32)
Calcium: 9.2 mg/dL (ref 8.4–10.5)
Creatinine, Ser: 0.65 mg/dL (ref 0.40–1.20)
GFR: 97.89 mL/min (ref >60.00)
Glucose, Bld: 86 mg/dL (ref 70–99)
POTASSIUM: 4.5 meq/L (ref 3.5–5.1)
Sodium: 141 mEq/L (ref 135–145)

## 2016-04-15 LAB — POC URINALSYSI DIPSTICK (AUTOMATED)
BILIRUBIN UA: NEGATIVE
Blood, UA: NEGATIVE
GLUCOSE UA: NEGATIVE
KETONES UA: NEGATIVE
Leukocytes, UA: NEGATIVE
Nitrite, UA: NEGATIVE
PH UA: 8
PROTEIN UA: NEGATIVE
Spec Grav, UA: 1.015
Urobilinogen, UA: 0.2

## 2016-04-15 LAB — VITAMIN D 25 HYDROXY (VIT D DEFICIENCY, FRACTURES): VITD: 29.8 ng/mL — ABNORMAL LOW (ref 30.00–100.00)

## 2016-04-15 LAB — CBC WITH DIFFERENTIAL/PLATELET
BASOS PCT: 0.3 % (ref 0.0–3.0)
Basophils Absolute: 0 10*3/uL (ref 0.0–0.1)
EOS ABS: 0.1 10*3/uL (ref 0.0–0.7)
EOS PCT: 2 % (ref 0.0–5.0)
HEMATOCRIT: 41 % (ref 36.0–46.0)
HEMOGLOBIN: 14 g/dL (ref 12.0–15.0)
Lymphocytes Relative: 36.6 % (ref 12.0–46.0)
Lymphs Abs: 1.7 10*3/uL (ref 0.7–4.0)
MCHC: 34.1 g/dL (ref 30.0–36.0)
MCV: 90.9 fl (ref 78.0–100.0)
Monocytes Absolute: 0.3 10*3/uL (ref 0.1–1.0)
Monocytes Relative: 6.2 % (ref 3.0–12.0)
Neutro Abs: 2.5 10*3/uL (ref 1.4–7.7)
Neutrophils Relative %: 54.9 % (ref 43.0–77.0)
Platelets: 169 10*3/uL (ref 150.0–400.0)
RBC: 4.51 Mil/uL (ref 3.87–5.11)
RDW: 13.8 % (ref 11.5–15.5)
WBC: 4.6 10*3/uL (ref 4.0–10.5)

## 2016-04-15 LAB — LIPID PANEL
Cholesterol: 184 mg/dL (ref 0–200)
HDL: 43.3 mg/dL (ref >39.00)
LDL Cholesterol: 124 mg/dL — ABNORMAL HIGH (ref 0–99)
NonHDL: 140.35
TRIGLYCERIDES: 84 mg/dL (ref 0.0–149.0)
Total CHOL/HDL Ratio: 4
VLDL: 16.8 mg/dL (ref 0.0–40.0)

## 2016-04-15 LAB — VITAMIN B12: Vitamin B-12: 378 pg/mL (ref 211–911)

## 2016-04-15 LAB — TSH: TSH: 1.77 u[IU]/mL (ref 0.35–4.50)

## 2016-04-22 ENCOUNTER — Encounter: Payer: Self-pay | Admitting: Family Medicine

## 2016-04-22 ENCOUNTER — Ambulatory Visit (INDEPENDENT_AMBULATORY_CARE_PROVIDER_SITE_OTHER): Payer: BLUE CROSS/BLUE SHIELD | Admitting: Family Medicine

## 2016-04-22 VITALS — BP 130/80 | HR 78 | Temp 98.1°F | Ht 66.54 in | Wt 158.0 lb

## 2016-04-22 DIAGNOSIS — Z23 Encounter for immunization: Secondary | ICD-10-CM | POA: Diagnosis not present

## 2016-04-22 DIAGNOSIS — Z0000 Encounter for general adult medical examination without abnormal findings: Secondary | ICD-10-CM

## 2016-04-22 DIAGNOSIS — E785 Hyperlipidemia, unspecified: Secondary | ICD-10-CM | POA: Insufficient documentation

## 2016-04-22 DIAGNOSIS — Z85828 Personal history of other malignant neoplasm of skin: Secondary | ICD-10-CM | POA: Insufficient documentation

## 2016-04-22 MED ORDER — TETANUS-DIPHTH-ACELL PERTUSSIS 5-2.5-18.5 LF-MCG/0.5 IM SUSP
0.5000 mL | Freq: Once | INTRAMUSCULAR | Status: AC
  Administered 2016-04-22: 0.5 mL via INTRAMUSCULAR

## 2016-04-22 NOTE — Progress Notes (Signed)
Phone: [phone]  Subjective:  Patient presents today for their annual physical. Chief complaint-noted.   See problem oriented charting- ROS- full  review of systems was completed and negative including No chest pain or shortness of breath. No headache or blurry vision. Does have some bilateral hip pain- going ot return to Dr. Tamala Julian.   The following were reviewed and entered/updated in epic: Past Medical History  Diagnosis Date  . Excess or deficiency of vitamin D   . Basal cell carcinoma   . Microhematuria   . GERD (gastroesophageal reflux disease)   . Osteopenia   . Atypical chest pain   . Thrombocytopenia ([place])   . Gallbladder polyp   . Anal fissure   . Hemorrhoids    Patient Active Problem List   Diagnosis Date Noted  . Popliteal artery occlusion, right ([place]) 11/29/2015    Priority: High  . History of skin cancer 04/22/2016    Priority: Medium  . Hyperlipidemia 04/22/2016    Priority: Medium  . THROMBOCYTOPENIA, CHRONIC 11/13/2009    Priority: Medium  . GERD 07/10/2008    Priority: Medium  . Lumbar radiculopathy 11/20/2015    Priority: Low  . Gluteal tendinitis of left buttock 11/14/2015    Priority: Low  . Tensor fascia lata syndrome 10/08/2015    Priority: Low  . Hemorrhoids     Priority: Low  . Thrombosed external hemorrhoid 10/19/2014    Priority: Low  . Anal fissure - posterior 01/29/2014    Priority: Low  . CHEST PAIN 01/10/2009    Priority: Low  . Osteopenia 09/18/2008    Priority: Low  . BENIGN POSITIONAL VERTIGO 01/18/2008    Priority: Low   Past Surgical History  Procedure Laterality Date  . Lung biopsy    . Colonoscopy  2005, 2015  . Upper gastrointestinal endoscopy  2009  . Thrombectomy femoral artery Right 11/29/2015    Procedure:  RIGHT  POPLITEAL to anterior tibial and posterior tibial ARTERY thrombectomy. Right above knee to below knee popliteal artery bypass;  Surgeon: Conrad Macy, MD;  Location: [facility];  Service: Vascular;   Laterality: Right;  . Intraoperative arteriogram Right 11/29/2015    Procedure: INTRA OPERATIVE ARTERIOGRAM;  Surgeon: Conrad [place], MD;  Location: [facility];  Service: Vascular;  Laterality: Right;    Family History  Problem Relation Age of Onset  . COPD Mother   . Osteoporosis Mother   . Prostate cancer Father   . Heart disease Brother 46    Before age 64  . Heart attack Brother 42    Massive  . Lymphoma Brother     non and hodgkins  . Coronary artery disease Brother   . Colon cancer Neg Hx   . Colon polyps Neg Hx   . Rectal cancer Neg Hx   . Stomach cancer Neg Hx     Medications- reviewed and updated Current Outpatient Prescriptions  Medication Sig Dispense Refill  . acetaminophen (TYLENOL) 500 MG tablet Take 1,000 mg by mouth at bedtime.    . Cholecalciferol (VITAMIN D3) 2000 units capsule Take 2,000 Units by mouth daily.    Marland Kitchen omeprazole (PRILOSEC) 20 MG capsule TAKE ONE CAPSULE BY MOUTH TWICE DAILY 180 capsule 3  . tretinoin (RETIN-A) 0.05 % cream Apply 1 application topically at bedtime.     . Wheat Dextrin (BENEFIBER) POWD Take 1 scoop by mouth daily.     No current facility-administered medications for this visit.    Allergies-reviewed and updated No Known Allergies  Social History   Social History  . Marital Status: Married    Spouse Name: N/A  . Number of Children: 1  . Years of Education: N/A   Occupational History  . Retired    Social History Main Topics  . Smoking status: Never Smoker   . Smokeless tobacco: Never Used  . Alcohol Use: No  . Drug Use: No  . Sexual Activity: Not Asked   Other Topics Concern  . None   Social History Narrative    Objective: BP 130/80 mmHg  Pulse 78  Temp(Src) 98.1 F (36.7 C) (Oral)  Ht 5' 6.53" (1.69 m)  Wt 158 lb (71.668 kg)  BMI 25.09 kg/m2  SpO2 98% Gen: NAD, resting comfortably HEENT: Mucous membranes are moist. Oropharynx normal Neck: no thyromegaly CV: RRR no murmurs rubs or gallops Lungs: CTAB no  crackles, wheeze, rhonchi Abdomen: soft/nontender/nondistended/normal bowel sounds. No rebound or guarding.  Ext: no edema, scarring on right lower leg Skin: warm, dry, no rash Neuro: grossly normal, moves all extremities, PERRLA  Assessment/Plan:  65 y.o. female presenting for annual physical.  Health Maintenance counseling: 1. Anticipatory guidance: Patient counseled regarding regular dental exams, eye exams, wearing seatbelts.  2. Risk factor reduction:  Advised patient of need for regular exercise and diet rich and fruits and vegetables to reduce risk of heart attack and stroke. Doing 1200 calories a day, exercises 45 minutes a day and walks 2 miles 5 days a week. Wants to lose 10 lbs.  3. Immunizations/screenings/ancillary studies Immunization History  Administered Date(s) Administered  . Influenza Whole 09/19/2007  . Influenza,inj,Quad PF,36+ Mos 09/26/2013, 08/30/2014, 09/04/2015  . Td 05/12/2004  . Zoster 11/14/2014   Health Maintenance Due  Topic Date Due  . Hepatitis C Screening - next bloodwork [birth year]  . HIV Screening - next bloodwork 06/06/1968  . TETANUS/TDAP - today 05/12/2014   4. Cervical cancer screening- follows with gynecology for pap most recently 10/21/15 normal.  5. Breast cancer screening-  breast exam with GYN and mammogram 10/16/15.  6. Colon cancer screening - 01/08/14 with 10 year follow up 7. Skin cancer screening- dermatologist every 6 months  Status of chronic or acute concerns  Popliteal artery occlusion on right- followed by vascular, states was to be on coumadin only through 02/27/16 then stopped. Not on aspirin yet - waiting for hemorrhoid banding. Has repeat vascular studies in 6 months. Will confirm regimen with Dr. Bridgett Larsson given coumadin was still on med list.   Thrombocytopenia chronic issue for years, previously seen by hematology and no further follow up suggested, she actually is in normal range this visit which is rare for her  GERD- stable  on prilosec 20mg  BID, discussed trial daily dosing   VIt D- hair low- will increase to 4k units for 2 weeks then return to 2k units daily  HLD_ 10 year risk 4.6%, no statin  Return in about 1 year (around 04/22/2017) for physical. Return precautions advised.   Garret Reddish, MD

## 2016-04-22 NOTE — Patient Instructions (Addendum)
Let's reduce the prilosec to once a day before dinner. If not effective, may go back to twice a day.   Tdap today  I will let you know if Dr. Bridgett Larsson says anything different about coumadin.  VIt D- hair low- will increase to 4k units for 2 weeks then return to 2k units daily

## 2016-04-23 ENCOUNTER — Telehealth: Payer: Self-pay

## 2016-04-23 NOTE — Telephone Encounter (Signed)
RE: needs your decision on Coumadin  Received: Today    Conrad [place], MD  Denman George, RN           As noted, start aspirin after recovered from hemorrhoidectomy       Previous Messages     ----- Message -----   From: Denman George, RN   Sent: 04/23/2016 10:13 AM    To: Conrad Haugen, MD  Subject: needs your decision on Coumadin          Rec'd this staff msg. re: whether or not pt. should d/c coumadin. Per your last office note she was to start ASA 81 mg, post hemorrhoidectomy.  Please advise.    Hello Dr. Bridgett Larsson and Arbie Cookey,    I saw patient today for physical. Coumadin was still on her med list. She tells me that she stopped 02/27/16. I looked through last few notes and I may have missed it but could not find this clearly stated. She is not on aspirin yet- I did see you mentioned starting this after hemorrhoid banding and I confirmed this with her. Just wanted to make sure supposed to be off coumadin.    Thanks,    Garret Reddish, MD, PCP

## 2016-05-20 NOTE — Addendum Note (Signed)
Addended by: Peter Minium K on: 05/20/2016 05:00 PM   Modules accepted: Orders

## 2016-06-07 ENCOUNTER — Encounter: Payer: Self-pay | Admitting: Family Medicine

## 2016-07-22 ENCOUNTER — Telehealth: Payer: Self-pay

## 2016-07-22 ENCOUNTER — Telehealth: Payer: Self-pay | Admitting: Family Medicine

## 2016-07-22 NOTE — Telephone Encounter (Signed)
Rec'd phone call from pt.  Reported an episode, earlier today, of sudden onset of right arm pain, in upper arm muscle, and radiation to the fingers.  Stated the episode lasted approx. 30 minutes or more.  Reported she was sitting and working puzzle, at time of pain episode.  Stated the pain subsided after about 30 min., and she hasn't had any more problems with right arm.  Denied any numbness or any change in temperature of right arm.  Denied any swelling of right arm or hand.  Denied chest pain or SOB.  Stated she has good movement of right upper extremity, at this time.  Stated she was concerned about a possible blood clot, given her history.  Questioned about any physical exertion or strain to the right arm; stated she works out everyday, with lifting 3 # weights and doing push-ups; admitted to doing this routine this morning.  Advised that she should continue to monitor, and may want to request an appt. with her PCP, for further evaluation.  Verb. Understanding, and agreed.

## 2016-07-22 NOTE — Telephone Encounter (Signed)
Home care advice was given. No appointment scheduled.

## 2016-07-22 NOTE — Telephone Encounter (Signed)
Patient Name: Elizabeth Douglas  DOB: 1951/06/29    Initial Comment Caller states she was sitting in a chair, working a puzzle. She got a sharp pain in her right arm. It lasted about 45 minutes. Little numbness and tingling down to her fingers. She had a blood clot in December in her right leg- in the artery.   Nurse Assessment  Nurse: Mallie Mussel, RN, Alveta Heimlich Date/Time Eilene Ghazi Time): 07/22/2016 2:47:09 PM  Confirm and document reason for call. If symptomatic, describe symptoms. You must click the next button to save text entered. ---Caller states that she had a sudden sharp pain in her right arm which lasted about 45 minutes. This happened about an hour ago. While she had the pain, she had a tingling sensation in her hand and fingers. Denies the tingling at present. She still has a "little bit" of soreness in her arm. She has a history of DVT in her right leg. Denies swelling of the arm. Denies redness of skin on the right arm. The area does not feel hot to touch.  Has the patient traveled out of the country within the last 30 days? ---No  Does the patient have any new or worsening symptoms? ---Yes  Will a triage be completed? ---Yes  Related visit to physician within the last 2 weeks? ---No  Does the PT have any chronic conditions? (i.e. diabetes, asthma, etc.) ---No  Is this a behavioral health or substance abuse call? ---No     Guidelines    Guideline Title Affirmed Question Affirmed Notes  Arm Pain Arm pain (all triage questions negative)    Final Disposition User   Jacona, RN, Alveta Heimlich    Disagree/Comply: Leta Baptist

## 2016-07-23 ENCOUNTER — Ambulatory Visit (INDEPENDENT_AMBULATORY_CARE_PROVIDER_SITE_OTHER): Payer: BLUE CROSS/BLUE SHIELD | Admitting: Family Medicine

## 2016-07-23 ENCOUNTER — Encounter: Payer: Self-pay | Admitting: Family Medicine

## 2016-07-23 VITALS — BP 102/82 | HR 79 | Temp 97.6°F | Wt 156.4 lb

## 2016-07-23 DIAGNOSIS — S46311A Strain of muscle, fascia and tendon of triceps, right arm, initial encounter: Secondary | ICD-10-CM | POA: Diagnosis not present

## 2016-07-23 DIAGNOSIS — M79601 Pain in right arm: Secondary | ICD-10-CM

## 2016-07-23 NOTE — Progress Notes (Signed)
Subjective:  Elizabeth Douglas is a 65 y.o. year old very pleasant female patient who presents for/with See problem oriented charting ROS- no fever, chills, nausea, no chest pain or shortness of breath, no leg swelling, no persistent pain.see any ROS included in HPI as well.   Past Medical History-  Patient Active Problem List   Diagnosis Date Noted  . Popliteal artery occlusion, right ([place]) 11/29/2015    Priority: High  . History of skin cancer 04/22/2016    Priority: Medium  . Hyperlipidemia 04/22/2016    Priority: Medium  . THROMBOCYTOPENIA, CHRONIC 11/13/2009    Priority: Medium  . GERD 07/10/2008    Priority: Medium  . Lumbar radiculopathy 11/20/2015    Priority: Low  . Gluteal tendinitis of left buttock 11/14/2015    Priority: Low  . Tensor fascia lata syndrome 10/08/2015    Priority: Low  . Hemorrhoids     Priority: Low  . Thrombosed external hemorrhoid 10/19/2014    Priority: Low  . Anal fissure - posterior 01/29/2014    Priority: Low  . CHEST PAIN 01/10/2009    Priority: Low  . Osteopenia 09/18/2008    Priority: Low  . BENIGN POSITIONAL VERTIGO 01/18/2008    Priority: Low    Medications- reviewed and updated Current Outpatient Prescriptions  Medication Sig Dispense Refill  . acetaminophen (TYLENOL) 500 MG tablet Take 1,000 mg by mouth at bedtime.    Marland Kitchen aspirin EC 81 MG tablet Take 81 mg by mouth daily.    . Cholecalciferol (VITAMIN D3) 2000 units capsule Take 2,000 Units by mouth daily.    Marland Kitchen omeprazole (PRILOSEC) 20 MG capsule TAKE ONE CAPSULE BY MOUTH TWICE DAILY 180 capsule 3  . tretinoin (RETIN-A) 0.05 % cream Apply 1 application topically at bedtime.     . Wheat Dextrin (BENEFIBER) POWD Take 1 scoop by mouth daily.     No current facility-administered medications for this visit.     Objective: BP 102/82 (BP Location: Left Arm, Patient Position: Sitting, Cuff Size: Normal)   Pulse 79   Temp 97.6 F (36.4 C) (Oral)   Wt 156 lb 6.4 oz (70.9 kg)   SpO2 97%    BMI 24.84 kg/m  Gen: NAD, resting comfortably CV: RRR no murmurs rubs or gallops Lungs: CTAB no crackles, wheeze, rhonchi Abdomen: soft/nontender/nondistended/normal bowel sounds. Ext: trace pretibial edema on right, none on left bilateral arms with 2+ equal pulses No pain with flexion of arm Mild pain in triceps with extension Some pain with palpation of tricep- pain worsens when squeeze tricep while extends Skin: warm, dry, no rash Neuro: grossly normal, moves all extremities, normal gait  Assessment/Plan:  Right arm pain S: Patient was doing her regular arm exercises yesterday morning. Later in afternoon sitting do do crossword puzzle with right arm. She felt a sudden 5/10 aching pain in right arm mainly tricep area with some radiation to bicep and below elbow. Pulled arm close to her and monitored. Within 30-45 minutes completely resolved. She is a high risk patient with history of popliteal artery occlusion followed by vascular. She is no longer on coumadin but is on chronic aspirin therapy. She admits to some pain with extending or squeezing tricep A/P: high suspicion this is muscular strain and potentially spasm yesterday considering pain with extension and palpation of tricep. Equal pulses reassuring.resolution of pain reassuring. Regardless she is high risk and we discussed if recurs would get US arterial duplex right arm or have her get this at her vascular  office potentially. She will watch closely and update [place] if recurrence and particularly if persistence.    Meds ordered this encounter  Medications  . aspirin EC 81 MG tablet    Sig: Take 81 mg by mouth daily.    Return precautions advised.  Garret Reddish, MD

## 2016-07-23 NOTE — Telephone Encounter (Signed)
She was seen today

## 2016-07-23 NOTE — Progress Notes (Signed)
Pre visit review using our clinic review tool, if applicable. No additional management support is needed unless otherwise documented below in the visit note. 

## 2016-07-23 NOTE — Patient Instructions (Signed)
Strongly suspect this is muscular in origin- seems like a tricep issue.   Pulses are excellent- pain gone except when we agitate the tricep.   Would monitor but have very low threshold to do ultrasound arterial duplex right upper extremity if this recurs

## 2016-07-23 NOTE — Telephone Encounter (Signed)
Dr. Yong Channel, please see message and advise if anything further needs to be done. Thanks

## 2016-08-26 IMAGING — US US TRANSVAGINAL NON-OB
1 series · 13 of 25 positions shown · non-contrast
Comparison: CT of the abdomen and pelvis 07/09/2015

CLINICAL DATA: Postmenopausal bleeding for 4 days.



[Series 1: us transvaginal non-ob · 0.26mm/px · 79 acquisitions, 13 frames shown]
[im 1/79]
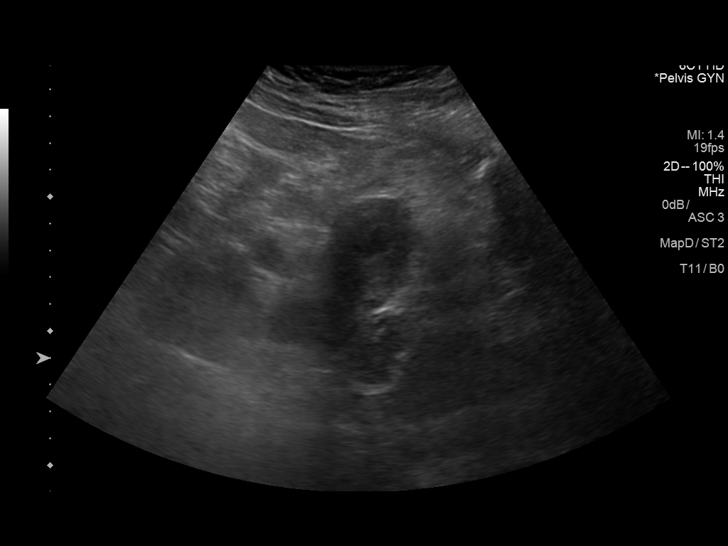
[im 7/79]
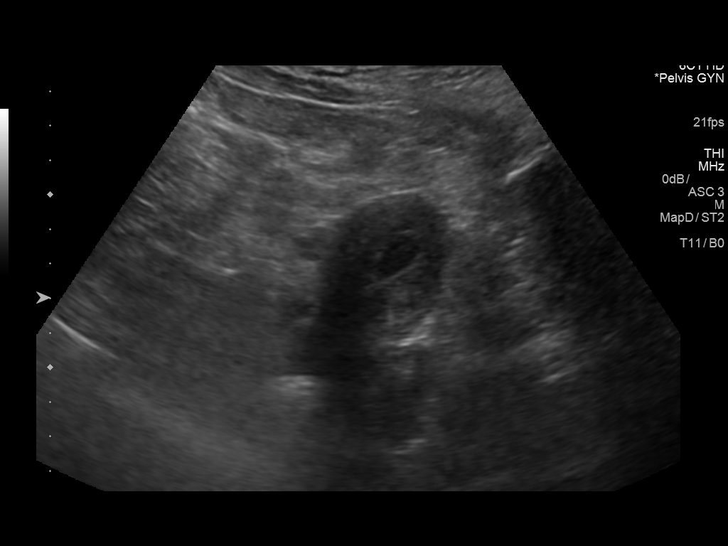
[im 14/79]
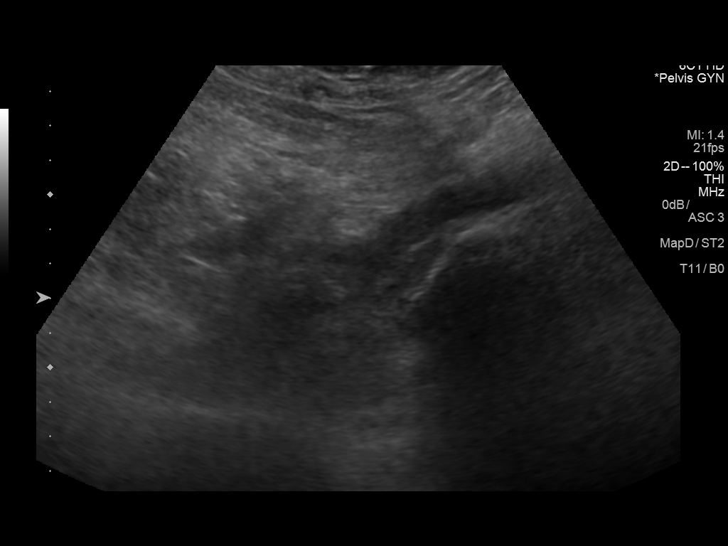
[im 20/79]
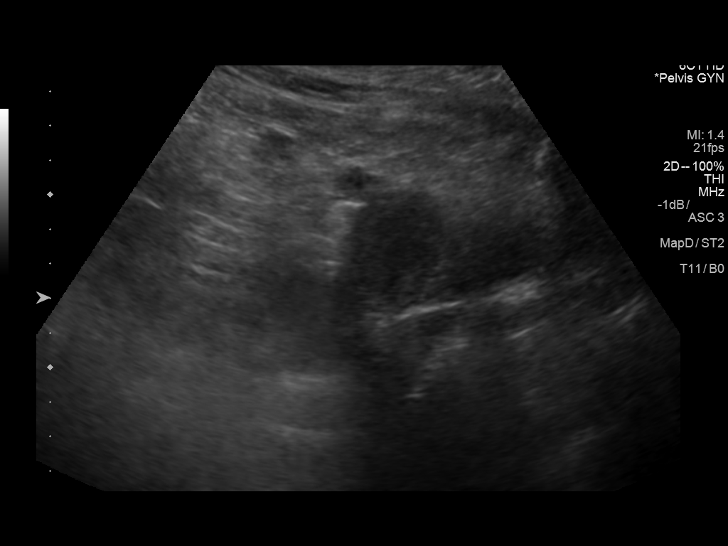
[im 27/79]
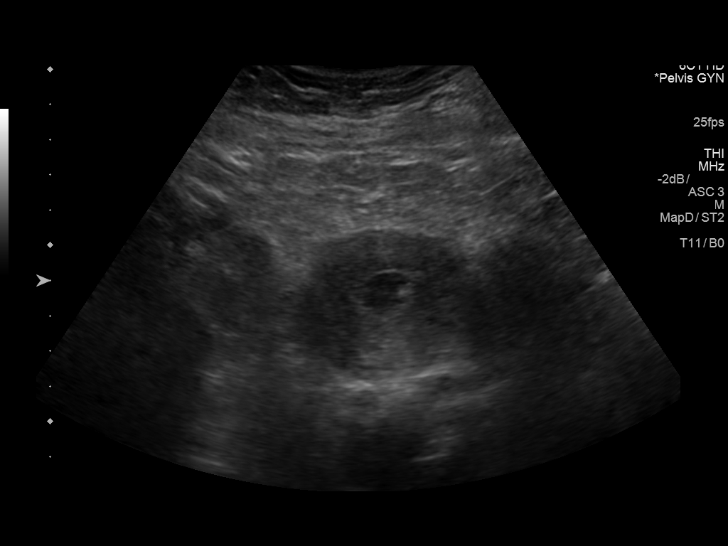
[im 33/79]
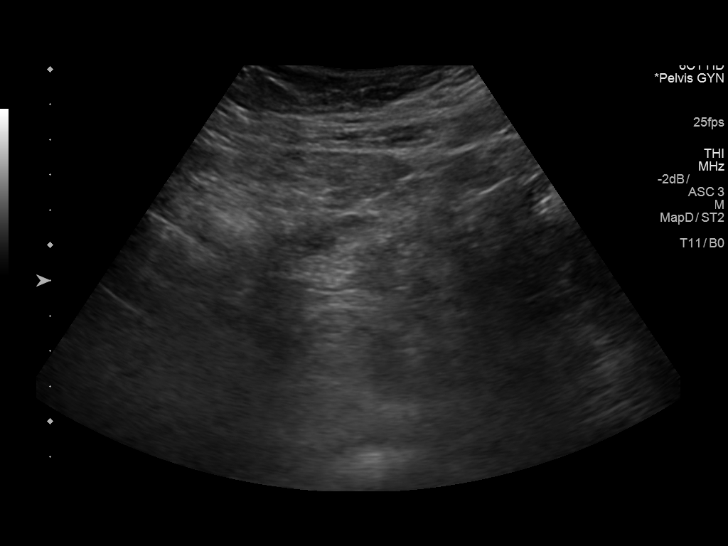
[im 40/79]
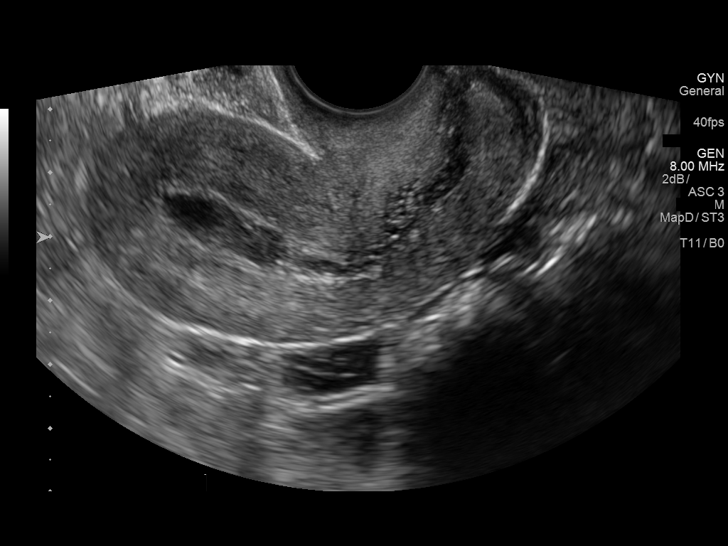
[im 46/79]
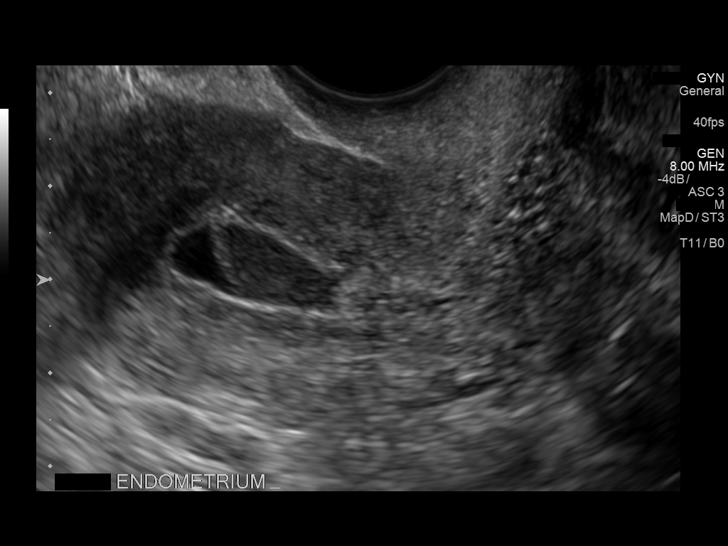
[im 53/79]
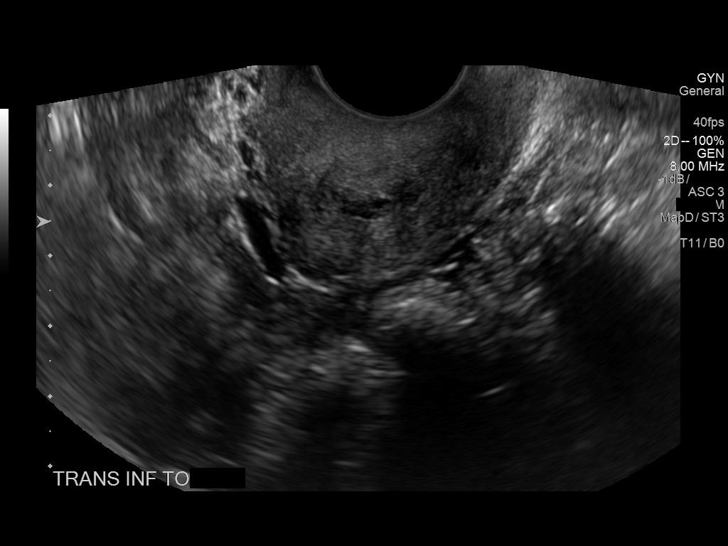
[im 59/79]
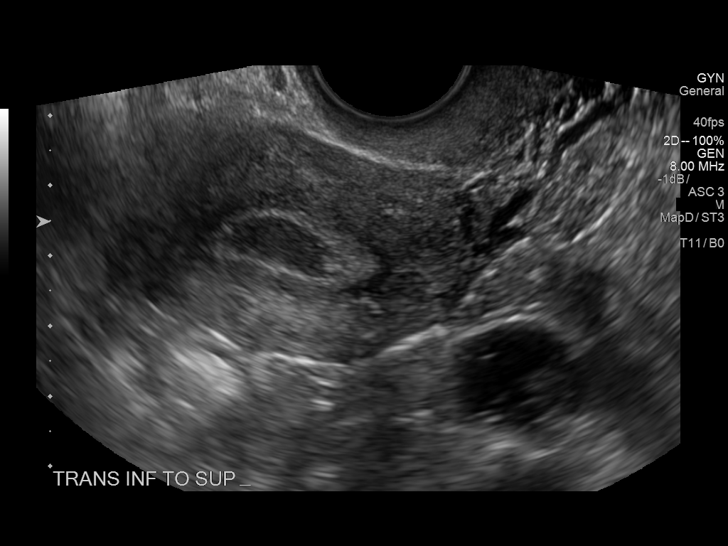
[im 66/79]
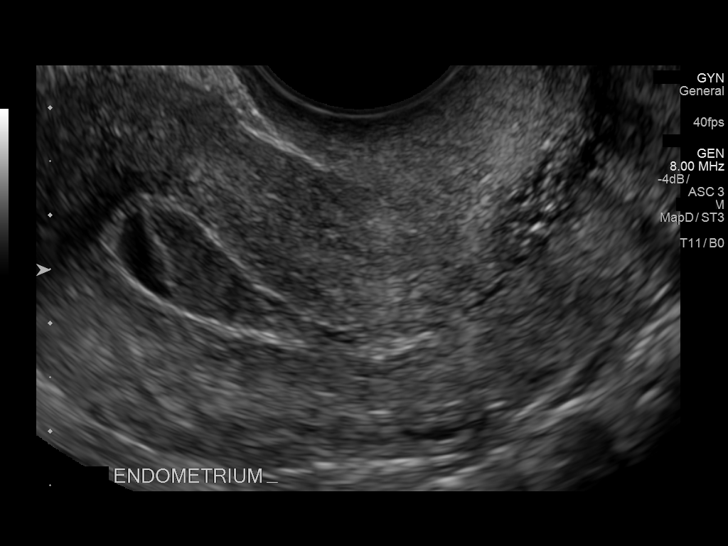
[im 72/79]
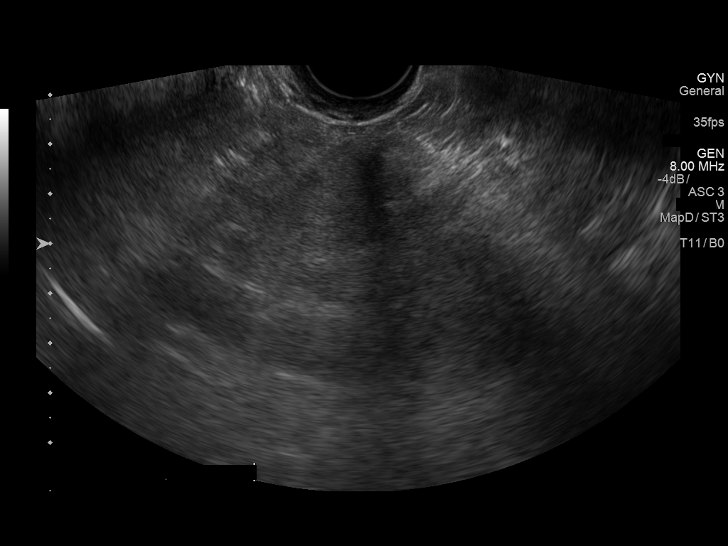
[im 79/79]
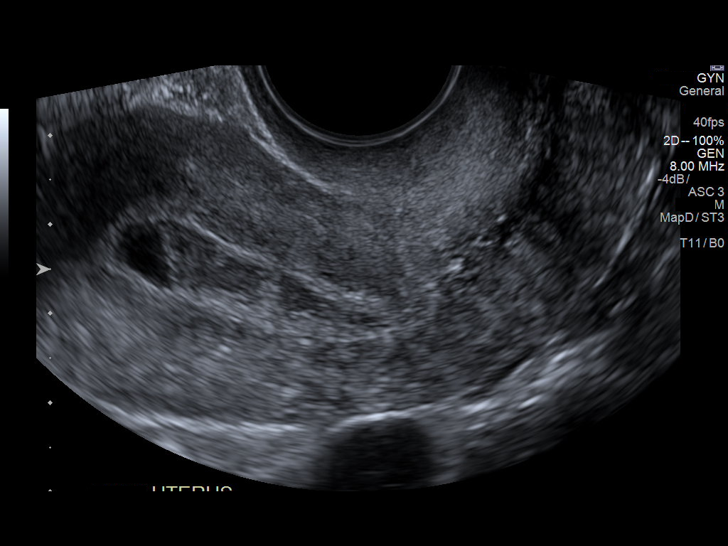

[13 of 25 positions shown; findings below may reference images not displayed]

FINDINGS: Uterus

Measurements: 7.3 x 3.5 x 5.0 cm. No fibroids or other mass
visualized.

Endometrium

Thickness: 3.0 mm. Fluid is identified within the endometrial canal.
A fluid fluid level is identified.

Right ovary

Measurements: The ovary is not visualized, either absent or obscured
. No adnexal mass.

Left ovary

Measurements: The ovary is not visualized, either absent or obscured
. No adnexal mass.

Other findings

No abnormal free fluid.
IMPRESSION: 1. A fluid /fluid level identified within the endometrial canal,
indicating some component of cervical stenosis.
2. Endometrium is 3.0 mm. In the setting of post-menopausal
bleeding, this is consistent with a benign etiology such as
endometrial atrophy. If bleeding remains unresponsive to hormonal or
medical therapy, sonohysterogram should be considered for focal
lesion work-up. (Ref: Radiological Reasoning: Algorithmic Workup of
Abnormal Vaginal Bleeding with Endovaginal Sonography and
Sonohysterography. AJR 6880; 191:S68-73).
3. Nonvisualized ovaries.  No adnexal mass.
4. No uterine mass.

## 2016-09-24 ENCOUNTER — Telehealth: Payer: Self-pay

## 2016-09-24 NOTE — Telephone Encounter (Signed)
Pt. called to report symptoms in right leg.  Stated the right leg had an "unusual feeling yesterday from the ankle up the lateral side of the leg, approx. 2/3 way up."  Reported it felt like it was going to cramp up, but didn't go into a full cramp.  Stated it wasn't really a numb sensation. Pt. Repeated, "it just felt unusual."  Reported today it is better.  Stated she went for her usual 2 mi. Walk, and didn't have any problems.  Denied rest pain.  Denied any nonhealing sores of right LE.  Denied any color or temperature change of the right foot/ lower leg.  Asking if her appt. could be moved to earlier date.  Stated she feels like the symptoms are similar to prior to her surgery.  Verb. Continuing ASA, as advised, previously.  Advised will have a scheduler to look at possible options to move appt. To earlier date.  Also encouraged to call office if symptoms worsen prior to appt.  Verb. Understanding.

## 2016-10-01 NOTE — Telephone Encounter (Signed)
Pt has opted to keep her appt, wants to see Bridgett Larsson and not the NP, states her symptoms have gotten better not worse.

## 2016-10-08 ENCOUNTER — Other Ambulatory Visit: Payer: Self-pay | Admitting: *Deleted

## 2016-10-08 DIAGNOSIS — M79606 Pain in leg, unspecified: Secondary | ICD-10-CM

## 2016-10-08 DIAGNOSIS — I739 Peripheral vascular disease, unspecified: Secondary | ICD-10-CM

## 2016-10-08 DIAGNOSIS — Z48812 Encounter for surgical aftercare following surgery on the circulatory system: Secondary | ICD-10-CM

## 2016-10-09 ENCOUNTER — Ambulatory Visit: Payer: BLUE CROSS/BLUE SHIELD | Admitting: Vascular Surgery

## 2016-10-09 ENCOUNTER — Ambulatory Visit (HOSPITAL_COMMUNITY)
Admission: RE | Admit: 2016-10-09 | Discharge: 2016-10-09 | Disposition: A | Discharge status: Home / Self Care | Payer: BLUE CROSS/BLUE SHIELD | Source: Ambulatory Visit | Attending: Vascular Surgery | Admitting: Vascular Surgery

## 2016-10-09 ENCOUNTER — Encounter: Payer: Self-pay | Admitting: Vascular Surgery

## 2016-10-09 ENCOUNTER — Ambulatory Visit (INDEPENDENT_AMBULATORY_CARE_PROVIDER_SITE_OTHER)
Admission: RE | Admit: 2016-10-09 | Discharge: 2016-10-09 | Disposition: A | Discharge status: Home / Self Care | Payer: BLUE CROSS/BLUE SHIELD | Source: Ambulatory Visit | Attending: Vascular Surgery | Admitting: Vascular Surgery

## 2016-10-09 DIAGNOSIS — I739 Peripheral vascular disease, unspecified: Secondary | ICD-10-CM | POA: Insufficient documentation

## 2016-10-09 DIAGNOSIS — I70201 Unspecified atherosclerosis of native arteries of extremities, right leg: Secondary | ICD-10-CM

## 2016-10-09 DIAGNOSIS — I743 Embolism and thrombosis of arteries of the lower extremities: Secondary | ICD-10-CM | POA: Diagnosis not present

## 2016-10-09 DIAGNOSIS — M79606 Pain in leg, unspecified: Secondary | ICD-10-CM | POA: Diagnosis not present

## 2016-10-09 DIAGNOSIS — Z48812 Encounter for surgical aftercare following surgery on the circulatory system: Secondary | ICD-10-CM | POA: Diagnosis not present

## 2016-10-15 NOTE — Progress Notes (Signed)
Established Previous Bypass   History of Present Illness  Elizabeth Douglas is a 65 y.o. (August 18, 1951) female who presents with chief complaint: routine follow-up.  Previous operation(s) include:   1. Right popliteal thrombectomy 2. Right posterior tibial artery thrombectomy  3. Right anterior tibial artery thrombectomy  4. Right above-the-knee to below-the-knee popliteal bypass with ipsilateral non-reverse greater saphenous vein  5. Open cannulation of right common femoral artery  6. Right leg runoff (Date: 11/30/15).  The patient's symptoms resolved except for c/o "pebbles on right foot" with intermittent numbness.  Pt notes resolution of prior calf pain right leg.  The patient's treatment regimen currently included: maximal medical management and walking.  The patient's PMH, PSH, SH, and FamHx are unchanged from 04/02/16.  Current Outpatient Prescriptions  Medication Sig Dispense Refill  . acetaminophen (TYLENOL) 500 MG tablet Take 1,000 mg by mouth at bedtime.    Marland Kitchen aspirin EC 81 MG tablet Take 81 mg by mouth daily.    . Cholecalciferol (VITAMIN D3) 2000 units capsule Take 2,000 Units by mouth daily.    Marland Kitchen omeprazole (PRILOSEC) 20 MG capsule TAKE ONE CAPSULE BY MOUTH TWICE DAILY 180 capsule 3  . tretinoin (RETIN-A) 0.05 % cream Apply 1 application topically at bedtime.     . Wheat Dextrin (BENEFIBER) POWD Take 1 scoop by mouth daily.     No current facility-administered medications for this visit.    No Known Allergies  On ROS today:no intermittent claudication , no rest pain   Physical Examination Vitals:   10/16/16 1012  BP: 108/76  Pulse: 68  Resp: 14  Temp: 97.6 F (36.4 C)  TempSrc: Oral  SpO2: 98%  Weight: 152 lb (68.9 kg)  Height: 5\' 7"  (1.702 m)   Body mass index is 23.81 kg/m.   General: A&O x 3, WDWN  Pulmonary: Sym exp, good air movt, CTAB, no rales, rhonchi, & wheezing  Cardiac: RRR, Nl S1, S2, no Murmurs, rubs or  gallops  Vascular: Vessel Right Left  Radial Palpable Palpable  Brachial Palpable Palpable  Carotid Palpable, without bruit Palpable, without bruit  Aorta Not palpable N/A  Femoral Palpable Palpable  Popliteal Not palpable Not palpable  PT Palpable Palpable  DP Faintly Palpable Palpable   Gastrointestinal: soft, NTND, no G/R, no HSM, no masses, no CVAT B  Musculoskeletal: M/S 5/5 throughout , Extremities without ischemic changes , incisions are healed  Neurologic: Pain and light touch intact in extremities , Motor exam as listed above   Non-Invasive Vascular Imaging ABI (Date: 10/09/16)  R:   ABI: 1.16 (1.1),   DP: tri  PT: tri  TBI:  0.92  L:   ABI: 1.11 (1.1),   DP: tri  PT: tri  TBI: 0.45  Bypass Duplex (Date: 10/09/16)  Triphasic throughout  Proximal to bypass: 64 c/s  With-in bypass: 22-68 c/s  Distal to bypass: 56 c/s   Medical Decision Making  Elizabeth Douglas is a 65 y.o. (1951/07/28) female who presents with:  s/p R pop, PT, and AT TE, R AK to BK pop bypass for R pop occlusion of unknown etiology (possible atypical entrapment)   Based on the patient's vascular studies and examination, I have offered the patient: BLE ABI and bypass duplex in 6 months.  I discussed in depth with the patient the nature of atherosclerosis, and emphasized the importance of maximal medical management including strict control of blood pressure, blood glucose, and lipid levels, obtaining regular exercise, and cessation of smoking.  The patient is aware that without maximal medical management the underlying atherosclerotic disease process will progress, limiting the benefit of any interventions.  I discussed in depth with the patient the need for long term surveillance to improve the primary assisted patency of his bypass.  The patient agrees to cooperate with such.  The patient is currently not on a statin: as not medically indicated.  The patient is  currently not on an anti-platelet.  The patient will be restarted on ASA 81 mg PO dailyas she has recovered from her hemorrhoidectomy  Thank you for allowing [place] to participate in this patient's care.   [name], MD [facility] [place] Office: [phone] Pager: [phone]

## 2016-10-16 ENCOUNTER — Other Ambulatory Visit: Payer: Self-pay

## 2016-10-16 ENCOUNTER — Encounter: Payer: Self-pay | Admitting: Vascular Surgery

## 2016-10-16 ENCOUNTER — Ambulatory Visit (INDEPENDENT_AMBULATORY_CARE_PROVIDER_SITE_OTHER): Payer: BLUE CROSS/BLUE SHIELD | Admitting: Vascular Surgery

## 2016-10-16 VITALS — BP 108/76 | HR 68 | Temp 97.6°F | Resp 14 | Ht 67.0 in | Wt 152.0 lb

## 2016-10-16 DIAGNOSIS — I743 Embolism and thrombosis of arteries of the lower extremities: Secondary | ICD-10-CM | POA: Diagnosis not present

## 2016-10-16 DIAGNOSIS — I70201 Unspecified atherosclerosis of native arteries of extremities, right leg: Secondary | ICD-10-CM

## 2016-10-16 DIAGNOSIS — I739 Peripheral vascular disease, unspecified: Secondary | ICD-10-CM

## 2016-11-16 LAB — HM MAMMOGRAPHY

## 2016-11-17 ENCOUNTER — Encounter: Payer: Self-pay | Admitting: Family Medicine

## 2016-11-25 ENCOUNTER — Other Ambulatory Visit: Payer: Self-pay | Admitting: Family Medicine

## 2016-11-25 ENCOUNTER — Encounter: Payer: Self-pay | Admitting: Family Medicine

## 2017-03-26 ENCOUNTER — Encounter (HOSPITAL_COMMUNITY): Payer: BLUE CROSS/BLUE SHIELD

## 2017-03-26 ENCOUNTER — Ambulatory Visit: Payer: BLUE CROSS/BLUE SHIELD | Admitting: Vascular Surgery

## 2017-04-21 ENCOUNTER — Encounter: Payer: Self-pay | Admitting: Vascular Surgery

## 2017-04-22 ENCOUNTER — Other Ambulatory Visit: Payer: BLUE CROSS/BLUE SHIELD

## 2017-04-27 NOTE — Progress Notes (Signed)
Established Previous Bypass   History of Present Illness  Elizabeth Douglas is a 66 y.o. ([birth year]) female who presents with chief complaint: routine follow-up. Previous operation(s) include:   1. Right popliteal thrombectomy 2. Right posterior tibial artery thrombectomy  3. Right anterior tibial artery thrombectomy  4. Right above-the-knee to below-the-knee popliteal bypass with ipsilateral non-reverse greater saphenous vein  5. Open cannulation of right common femoral artery  6. Right leg runoff (Date: 11/30/15).  The patient's symptoms resolved except for c/o cramping in her right calf when she starts to walk, but this does improve as she walks.  She tries to walk 2.5 miles per day.  She does not have any issues with short distances such as the grocery store.    Pt notes resolution of prior calf pain right leg.  She states that when she is bending her knee to stand up it still "catches".  She states that it is not worrisome and has improved but is still not back to normal.    The patient's treatment regimen currently included: maximal medical management and walking.  She takes a daily baby aspirin.  She is not on a statin as her cholesterol is normal.  Her blood pressure is well controlled and normal.   The patient's PMH, PSH, SH, and FamHx are unchanged from 10/16/16.  She does have dizziness and has benign positional vertigo.    Current Outpatient Prescriptions  Medication Sig Dispense Refill  . acetaminophen (TYLENOL) 500 MG tablet Take 1,000 mg by mouth at bedtime.    Marland Kitchen aspirin EC 81 MG tablet Take 81 mg by mouth daily.    . Cholecalciferol (VITAMIN D3) 2000 units capsule Take 2,000 Units by mouth daily.    Marland Kitchen omeprazole (PRILOSEC) 20 MG capsule TAKE ONE CAPSULE BY MOUTH TWICE DAILY 180 capsule 3  . tretinoin (RETIN-A) 0.05 % cream Apply 1 application topically at bedtime.     . Wheat Dextrin (BENEFIBER) POWD Take 1 scoop by mouth daily.     No current  facility-administered medications for this visit.    On ROS today: see HPI, otherwise negative on 10 point review.   Physical Examination  Vitals:   04/30/17 1527  BP: 118/79  Pulse: 73  Resp: 18  SpO2: 97%  Weight: 149 lb (67.6 kg)  Height: 5\' 7"  (1.702 m)     Body mass index is 23.34 kg/m.  General: A&O x 3, WDWN  Pulmonary: Sym exp, good air movt, CTAB, no rales, rhonchi, & wheezing  Cardiac: RRR, Nl S1, S2, no Murmurs, rubs or gallops  Vascular: Vessel Right Left  Radial Palpable Palpable  Brachial Palpable Palpable  Carotid Palpable, without bruit Palpable, without bruit  Aorta Not palpable N/A  Popliteal Not palpable Not palpable  PT Palpable Palpable  DP Palpable Palpable   Gastrointestinal: soft, NTND, no G/R, no HSM, no masses, no CVAT B  Musculoskeletal: M/S 5/5 throughout , Extremities without ischemic changes , incisions are healed  Neurologic: Pain and light touch intact in extremities , Motor exam as listed above  Skin:  Incisions are well healed.    Non-Invasive Vascular Imaging ABI (04/30/2017)  R:   ABI: 1.04 (1.16),   PT: tri  DP: tri  TBI:  1.04  L:   ABI: 1.25 (1.11),   PT: tri  DP: tri  TBI: 0.79   Bypass Duplex(04/30/2017 )  Triphasic throughout  Proximal to bypass: 80 c/s  With-in bypass: 34-113 c/s  Distal to bypass: 39  c/s   Medical Decision Making  Elizabeth Douglas is a 66 y.o. (06-17-1951) female who presents with: s/p R pop, PT, and AT TE, R AK to BK pop bypass for R pop occlusion of unknown etiology (possible atypical entrapment)   Based on the patient's vascular studies and examination, I have offered the patient: BLE ABI and bypass duplex in 6 months.  I discussed in depth with the patient the nature of atherosclerosis, and emphasized the importance of maximal medical management including strict control of blood pressure, blood glucose, and lipid levels, obtaining regular exercise, and  cessation of smoking.   The patient is aware that without maximal medical management the underlying atherosclerotic disease process will progress, limiting the benefit of any interventions.  I discussed in depth with the patient the need for long term surveillance to improve the primary assisted patency of his bypass. The patient agrees to cooperate with such.  The patient is currently not on a statin: as not medically indicated.  The patient is currently not on an anti-platelet. The patient is on ASA 81 mg PO daily as she has recovered from her hemorrhoidectomy  She is a non smoker and is okay for her gynecologist to prescribe vaginal estrogen as she is at low risk for thrombosis  Thank you for allowing [place] to participate in this patient's care.   Leontine Locket, [facility] 04/30/2017 3:51 PM   [name], MD [facility]: [phone] Pager: [phone]  Addendum  I have independently interviewed and examined the patient, and I agree with the physician assistant's findings.  Continue q6 month surveillance studies.   [name], MD, FACS [facility] [place] Office: [phone] Pager: [phone]  04/30/2017, 3:53 PM

## 2017-04-29 ENCOUNTER — Encounter: Payer: Self-pay | Admitting: Family Medicine

## 2017-04-29 ENCOUNTER — Ambulatory Visit (INDEPENDENT_AMBULATORY_CARE_PROVIDER_SITE_OTHER): Payer: BLUE CROSS/BLUE SHIELD | Admitting: Family Medicine

## 2017-04-29 VITALS — BP 102/68 | HR 75 | Temp 97.9°F | Ht 67.0 in | Wt 149.0 lb

## 2017-04-29 DIAGNOSIS — M85852 Other specified disorders of bone density and structure, left thigh: Secondary | ICD-10-CM

## 2017-04-29 DIAGNOSIS — H8110 Benign paroxysmal vertigo, unspecified ear: Secondary | ICD-10-CM | POA: Diagnosis not present

## 2017-04-29 DIAGNOSIS — Z114 Encounter for screening for human immunodeficiency virus [HIV]: Secondary | ICD-10-CM | POA: Diagnosis not present

## 2017-04-29 DIAGNOSIS — Z79899 Other long term (current) drug therapy: Secondary | ICD-10-CM | POA: Diagnosis not present

## 2017-04-29 DIAGNOSIS — Z1159 Encounter for screening for other viral diseases: Secondary | ICD-10-CM

## 2017-04-29 DIAGNOSIS — Z0000 Encounter for general adult medical examination without abnormal findings: Secondary | ICD-10-CM

## 2017-04-29 DIAGNOSIS — K219 Gastro-esophageal reflux disease without esophagitis: Secondary | ICD-10-CM | POA: Diagnosis not present

## 2017-04-29 DIAGNOSIS — I70201 Unspecified atherosclerosis of native arteries of extremities, right leg: Secondary | ICD-10-CM

## 2017-04-29 DIAGNOSIS — D696 Thrombocytopenia, unspecified: Secondary | ICD-10-CM

## 2017-04-29 DIAGNOSIS — E785 Hyperlipidemia, unspecified: Secondary | ICD-10-CM | POA: Diagnosis not present

## 2017-04-29 LAB — LIPID PANEL
CHOLESTEROL: 191 mg/dL (ref 0–200)
HDL: 57.6 mg/dL (ref >39.00)
LDL Cholesterol: 119 mg/dL — ABNORMAL HIGH (ref 0–99)
NONHDL: 133.08
Total CHOL/HDL Ratio: 3
Triglycerides: 68 mg/dL (ref 0.0–149.0)
VLDL: 13.6 mg/dL (ref 0.0–40.0)

## 2017-04-29 LAB — VITAMIN B12: Vitamin B-12: 407 pg/mL (ref 211–911)

## 2017-04-29 LAB — COMPREHENSIVE METABOLIC PANEL
ALK PHOS: 56 U/L (ref 39–117)
ALT: 8 U/L (ref 0–35)
AST: 18 U/L (ref 0–37)
Albumin: 4.4 g/dL (ref 3.5–5.2)
BUN: 12 mg/dL (ref 6–23)
CO2: 32 mEq/L (ref 19–32)
Calcium: 9.5 mg/dL (ref 8.4–10.5)
Chloride: 104 mEq/L (ref 96–112)
Creatinine, Ser: 0.66 mg/dL (ref 0.40–1.20)
GFR: 95.86 mL/min (ref >60.00)
GLUCOSE: 85 mg/dL (ref 70–99)
POTASSIUM: 4.6 meq/L (ref 3.5–5.1)
SODIUM: 141 meq/L (ref 135–145)
TOTAL PROTEIN: 6.7 g/dL (ref 6.0–8.3)
Total Bilirubin: 0.6 mg/dL (ref 0.2–1.2)

## 2017-04-29 LAB — CBC
HEMATOCRIT: 42.4 % (ref 36.0–46.0)
Hemoglobin: 14.3 g/dL (ref 12.0–15.0)
MCHC: 33.8 g/dL (ref 30.0–36.0)
MCV: 95.2 fl (ref 78.0–100.0)
Platelets: 174 10*3/uL (ref 150.0–400.0)
RBC: 4.46 Mil/uL (ref 3.87–5.11)
RDW: 12.8 % (ref 11.5–15.5)
WBC: 4.6 10*3/uL (ref 4.0–10.5)

## 2017-04-29 LAB — POC URINALSYSI DIPSTICK (AUTOMATED)
BILIRUBIN UA: NEGATIVE
Blood, UA: NEGATIVE
Glucose, UA: NEGATIVE
KETONES UA: NEGATIVE
LEUKOCYTES UA: NEGATIVE
Nitrite, UA: NEGATIVE
PROTEIN UA: NEGATIVE
SPEC GRAV UA: 1.01 (ref 1.010–1.025)
Urobilinogen, UA: 0.2 E.U./dL
pH, UA: 6.5 (ref 5.0–8.0)

## 2017-04-29 LAB — VITAMIN D 25 HYDROXY (VIT D DEFICIENCY, FRACTURES): VITD: 40.35 ng/mL (ref 30.00–100.00)

## 2017-04-29 LAB — HEPATITIS C ANTIBODY: HCV AB: NEGATIVE

## 2017-04-29 MED ORDER — MECLIZINE HCL 25 MG PO TABS: 25.0000 mg | ORAL_TABLET | Freq: Three times a day (TID) | ORAL | 0 refills | Status: DC | PRN

## 2017-04-29 NOTE — Patient Instructions (Addendum)
Refill meclizine. Option of vestibular rehabilitation if needed in the future. Can trial home exercises as well  No changes today  Schedule your bone density test at check out desk

## 2017-04-29 NOTE — Assessment & Plan Note (Signed)
Last scan 2015- update today. Taking vitamin D- works on getting calcium through food.

## 2017-04-29 NOTE — Progress Notes (Signed)
Phone: [phone]  Subjective:  Patient presents today for their annual physical. Chief complaint-noted.   See problem oriented charting- ROS- full  review of systems was completed and negative including No chest pain or shortness of breath. No headache or blurry vision. No pain in right leg. Prior right arm pain resolved.   The following were reviewed and entered/updated in epic: Past Medical History:  Diagnosis Date  . Anal fissure   . Atypical chest pain   . Basal cell carcinoma   . Excess or deficiency of vitamin D   . Gallbladder polyp   . GERD (gastroesophageal reflux disease)   . Hemorrhoids   . Microhematuria   . Osteopenia   . Thrombocytopenia [facility])    Patient Active Problem List   Diagnosis Date Noted  . Popliteal artery occlusion, right ([place]) 11/29/2015    Priority: High  . History of skin cancer 04/22/2016    Priority: Medium  . Hyperlipidemia 04/22/2016    Priority: Medium  . THROMBOCYTOPENIA, CHRONIC 11/13/2009    Priority: Medium  . GERD 07/10/2008    Priority: Medium  . Lumbar radiculopathy 11/20/2015    Priority: Low  . Gluteal tendinitis of left buttock 11/14/2015    Priority: Low  . Tensor fascia lata syndrome 10/08/2015    Priority: Low  . Hemorrhoids     Priority: Low  . Thrombosed external hemorrhoid 10/19/2014    Priority: Low  . Anal fissure - posterior 01/29/2014    Priority: Low  . CHEST PAIN 01/10/2009    Priority: Low  . Osteopenia of left femoral neck 09/18/2008    Priority: Low  . BENIGN POSITIONAL VERTIGO 01/18/2008    Priority: Low   Past Surgical History:  Procedure Laterality Date  . COLONOSCOPY  2005, 2015  . INTRAOPERATIVE ARTERIOGRAM Right 11/29/2015   Procedure: INTRA OPERATIVE ARTERIOGRAM;  Surgeon: Conrad Broadwater, MD;  Location: [place];  Service: Vascular;  Laterality: Right;  . LUNG BIOPSY    . THROMBECTOMY FEMORAL ARTERY Right 11/29/2015   Procedure:  RIGHT  POPLITEAL to anterior tibial and posterior tibial ARTERY  thrombectomy. Right above knee to below knee popliteal artery bypass;  Surgeon: Conrad , MD;  Location: [place];  Service: Vascular;  Laterality: Right;  . UPPER GASTROINTESTINAL ENDOSCOPY  2009    Family History  Problem Relation Age of Onset  . COPD Mother   . Osteoporosis Mother   . Prostate cancer Father   . Heart disease Brother 74       Before age 21  . Heart attack Brother 42       Massive  . Lymphoma Brother        non and hodgkins  . Coronary artery disease Brother   . Colon cancer Neg Hx   . Colon polyps Neg Hx   . Rectal cancer Neg Hx   . Stomach cancer Neg Hx     Medications- reviewed and updated Current Outpatient Prescriptions  Medication Sig Dispense Refill  . acetaminophen (TYLENOL) 500 MG tablet Take 1,000 mg by mouth at bedtime.    Marland Kitchen aspirin EC 81 MG tablet Take 81 mg by mouth daily.    . calcium elemental as carbonate (ANTACID MAXIMUM) 400 MG chewable tablet Chew 2,000 mg by mouth daily.    . Cholecalciferol (VITAMIN D3) 2000 units capsule Take 2,000 Units by mouth daily.    Marland Kitchen omeprazole (PRILOSEC) 20 MG capsule TAKE ONE CAPSULE BY MOUTH TWICE DAILY 180 capsule 3  . tretinoin (RETIN-A)  0.05 % cream Apply 1 application topically at bedtime.     . Wheat Dextrin (BENEFIBER) POWD Take 1 scoop by mouth daily.    . meclizine (ANTIVERT) 25 MG tablet Take 1 tablet (25 mg total) by mouth 3 (three) times daily as needed for dizziness. 30 tablet 0   No current facility-administered medications for this visit.     Allergies-reviewed and updated No Known Allergies  Social History   Social History  . Marital status: Married    Spouse name: N/A  . Number of children: 1  . Years of education: N/A   Occupational History  . Retired    Social History Main Topics  . Smoking status: Never Smoker  . Smokeless tobacco: Never Used  . Alcohol use No  . Drug use: No  . Sexual activity: Not Asked   Other Topics Concern  . None   Social History Narrative  .  None    Objective: BP 102/68 (BP Location: Left Arm, Patient Position: Sitting, Cuff Size: Large)   Pulse 75   Temp 97.9 F (36.6 C) (Oral)   Ht 5\' 7"  (1.702 m)   Wt 149 lb (67.6 kg)   SpO2 97%   BMI 23.34 kg/m  Gen: NAD, resting comfortably HEENT: Mucous membranes are moist. Oropharynx normal Neck: no thyromegaly CV: RRR no murmurs rubs or gallops Lungs: CTAB no crackles, wheeze, rhonchi Abdomen: soft/nontender/nondistended/normal bowel sounds. No rebound or guarding.  Ext: no edema Skin: warm, dry Neuro: grossly normal, moves all extremities, PERRLA  Assessment/Plan:  66 y.o. female presenting for annual physical.  Health Maintenance counseling: 1. Anticipatory guidance: Patient counseled regarding regular dental exams q54months, eye exams -yearly, wearing seatbelts.  2. Risk factor reduction:  Advised patient of need for regular exercise and diet rich and fruits and vegetables to reduce risk of heart attack and stroke. Exercise- walking 2.5 miles 5 days a week and also does home exercises. Diet-reasonable- reasonable plant base- has been losing weight. Nice steady weight loss over last year.  Wt Readings from Last 3 Encounters:  04/29/17 149 lb (67.6 kg)  10/16/16 152 lb (68.9 kg)  07/23/16 156 lb 6.4 oz (70.9 kg)  3. Immunizations/screenings/ancillary studies- offered shingrix - wants to wait until next year. Offered HIV and HCV screen Immunization History  Administered Date(s) Administered  . Influenza Whole 09/19/2007  . Influenza,inj,Quad PF,36+ Mos 09/26/2013, 08/30/2014, 09/04/2015  . Td 05/12/2004  . Tdap 04/22/2016  . Zoster 11/14/2014  4. Cervical cancer screening-  Sees Dr. Benjie Karvonen yearly. Pap 2016 plan 3 years.  5. Breast cancer screening-  breast exam with Dr. Benjie Karvonen and mammogram 11/16/16 and repeating yearly 6. Colon cancer screening - 01/08/14 with 10 year follow up 7. Skin cancer screening- sees dermatology specialists Dr. Delman Cheadle every 6 months. History of skin  cancer  Status of chronic or acute concerns   Popliteal artery occlusion on right on chronic aspirin at this point. Last year had been on coumadin. Seeing Dr. Bridgett Larsson for studies and visit tomorrow.   Thrombocytopenia for years- has seen hematology in past with no further work up suggested. Last 2 CBC actually in normal range- monitor again today  Vitamin D- on 2000 units a day. Update vitamin D  Seeing chiropractor for pelvic tilt. Still walking 2.5 miles a day  Slight suprapubic discomfort over the weekend- has resolved- wants UA  BENIGN POSITIONAL VERTIGO history of this but recurrence. Restarted 3 weeks ago. Has taken 3 meclizine total at 25mg . Discussed option vestibular rehab  but she states symptoms are rather minor- will refill meclizine   Osteopenia of left femoral neck Last scan 2015- update today. Taking vitamin D- works on getting calcium through food.   GERD GERD- on prilosec 20mg  BID last year, trial daily didn't work- back to twice a day.   Hyperlipidemia Hyperlipidemia- update lipids- 4.6% 10 year risk last year. On aspirin for primary prevention and popliteal artery issue  Return in about 1 year (around 04/29/2018) for physical.  Orders Placed This Encounter  Procedures  . DG Bone Density    Standing Status:   Future    Standing Expiration Date:   06/29/2018    Order Specific Question:   Reason for Exam (SYMPTOM  OR DIAGNOSIS REQUIRED)    Answer:   osteopenia follow up 2015    Order Specific Question:   Preferred imaging location?    Answer:   [facility]  . VITAMIN D 25 Hydroxy (Vit-D Deficiency, Fractures)    [place]  . CBC    [place]  . Comprehensive metabolic panel    [place]    Order Specific Question:   Has the patient fasted?    Answer:   No  . Lipid panel    [place]    Order Specific Question:   Has the patient fasted?    Answer:   No  . Vitamin B12  . HIV antibody  . Hepatitis C antibody  . POCT Urinalysis Dipstick (Automated)    Meds  ordered this encounter  Medications  . DISCONTD: calcium elemental as carbonate (ANTACID MAXIMUM) 400 MG chewable tablet    Sig: Chew 2,000 mg by mouth daily.  . calcium elemental as carbonate (ANTACID MAXIMUM) 400 MG chewable tablet    Sig: Chew 2,000 mg by mouth daily.  . meclizine (ANTIVERT) 25 MG tablet    Sig: Take 1 tablet (25 mg total) by mouth 3 (three) times daily as needed for dizziness.    Dispense:  30 tablet    Refill:  0    Return precautions advised.   Garret Reddish, MD

## 2017-04-29 NOTE — Assessment & Plan Note (Signed)
history of this but recurrence. Restarted 3 weeks ago. Has taken 3 meclizine total at 25mg . Discussed option vestibular rehab but she states symptoms are rather minor- will refill meclizine

## 2017-04-29 NOTE — Assessment & Plan Note (Signed)
Hyperlipidemia- update lipids- 4.6% 10 year risk last year. On aspirin for primary prevention and popliteal artery issue

## 2017-04-29 NOTE — Assessment & Plan Note (Signed)
GERD- on prilosec 20mg  BID last year, trial daily didn't work- back to twice a day.

## 2017-04-30 ENCOUNTER — Other Ambulatory Visit: Payer: Self-pay | Admitting: Vascular Surgery

## 2017-04-30 ENCOUNTER — Ambulatory Visit (INDEPENDENT_AMBULATORY_CARE_PROVIDER_SITE_OTHER)
Admission: RE | Admit: 2017-04-30 | Discharge: 2017-04-30 | Disposition: A | Discharge status: Home / Self Care | Payer: BLUE CROSS/BLUE SHIELD | Source: Ambulatory Visit | Attending: Vascular Surgery | Admitting: Vascular Surgery

## 2017-04-30 ENCOUNTER — Encounter: Payer: Self-pay | Admitting: Vascular Surgery

## 2017-04-30 ENCOUNTER — Ambulatory Visit (HOSPITAL_COMMUNITY)
Admission: RE | Admit: 2017-04-30 | Discharge: 2017-04-30 | Disposition: A | Discharge status: Home / Self Care | Payer: BLUE CROSS/BLUE SHIELD | Source: Ambulatory Visit | Attending: Vascular Surgery | Admitting: Vascular Surgery

## 2017-04-30 ENCOUNTER — Ambulatory Visit (INDEPENDENT_AMBULATORY_CARE_PROVIDER_SITE_OTHER): Payer: BLUE CROSS/BLUE SHIELD | Admitting: Vascular Surgery

## 2017-04-30 VITALS — BP 118/79 | HR 73 | Resp 18 | Ht 67.0 in | Wt 149.0 lb

## 2017-04-30 DIAGNOSIS — I70201 Unspecified atherosclerosis of native arteries of extremities, right leg: Secondary | ICD-10-CM | POA: Insufficient documentation

## 2017-04-30 DIAGNOSIS — I739 Peripheral vascular disease, unspecified: Secondary | ICD-10-CM | POA: Diagnosis not present

## 2017-04-30 DIAGNOSIS — Z95828 Presence of other vascular implants and grafts: Secondary | ICD-10-CM | POA: Diagnosis not present

## 2017-04-30 LAB — HIV ANTIBODY (ROUTINE TESTING W REFLEX): HIV 1&2 Ab, 4th Generation: NONREACTIVE

## 2017-05-05 ENCOUNTER — Encounter: Payer: Self-pay | Admitting: Family Medicine

## 2017-05-07 NOTE — Addendum Note (Signed)
Addended by: Lianne Cure A on: 05/07/2017 03:34 PM   Modules accepted: Orders

## 2017-05-14 ENCOUNTER — Telehealth: Payer: Self-pay | Admitting: Family Medicine

## 2017-05-14 NOTE — Telephone Encounter (Signed)
She can schedule her meclizine but should not drive. We could get her plugged into vestibular rehab but doubt we could get that done today. Please verify symptoms are similar to the past- with head movement, better when still, under a minute

## 2017-05-14 NOTE — Telephone Encounter (Signed)
Pt states her vertigo is worse and more frequent  today.  Pt is leaving town in the am and would like to know how to get through this..   [id number] - [place], [place]

## 2017-05-14 NOTE — Telephone Encounter (Signed)
Spoke to patient who states she will schedule her medication. She did say the last couple of days that the episodes can take 3-5 minutes to stop spinning. She just lays down when the episode occurs but she did state that it can take her an hour to recover. She is going out of town and will call and schedule an appointment if she is not feeling better by the time she comes back.

## 2017-05-18 ENCOUNTER — Telehealth: Payer: Self-pay | Admitting: Family Medicine

## 2017-05-18 NOTE — Telephone Encounter (Signed)
°  Pt is on vacation and went to Urgent care because of vertigo and they gave her some medicine and she would like a call back to make sure the medicine that she was given is ok for her to take    [phone]

## 2017-05-18 NOTE — Telephone Encounter (Signed)
Yes thanks, I dont expect her to have issues with that regimen

## 2017-05-18 NOTE — Telephone Encounter (Signed)
Called and spoke to patient who verbalized understanding  

## 2017-05-18 NOTE — Telephone Encounter (Signed)
Called and spoke to patient. She is on vacation and went to Urgent Care. They prescribed her Zofran for nausea, Meclizine 50mg  TID, and a Z pack. She wanted to make sure this was ok? She is planning on following up with [place] next week when she gets back.

## 2017-05-19 ENCOUNTER — Telehealth: Payer: Self-pay | Admitting: Family Medicine

## 2017-05-19 NOTE — Telephone Encounter (Signed)
Daughter called because she is at the beach with pt is  and pt had to go to UC in the day and ED last night.   The hospital is suggesting pt either go to vestibular rehab or see an  Otolaryngologist.  Pt states you are aware of her issues, and whichever dr you feel she should see is what she wants to do.  Pt will be home Saturday, and hopes she can get into someone next week asap. Pt having a difficult time. Will await your call.

## 2017-05-21 NOTE — Telephone Encounter (Signed)
I faxed over the referral to [facility] PT

## 2017-05-21 NOTE — Telephone Encounter (Signed)
Elizabeth Douglas can you get her set up with vestibular rehab on Monday? Under BPV then inform patient. I would like to see her sometime next week as well to follow up and consider ENT referral but would want to evaluate her first.

## 2017-05-21 NOTE — Telephone Encounter (Signed)
Spoke with patient. I have her scheduled for Monday to go to to [facility] Physical therapy for Vestibular Therapy. She is aware. I also scheduled her for follow up with Dr. Yong Channel on Tuesday.

## 2017-05-25 ENCOUNTER — Ambulatory Visit (INDEPENDENT_AMBULATORY_CARE_PROVIDER_SITE_OTHER): Payer: BLUE CROSS/BLUE SHIELD | Admitting: Family Medicine

## 2017-05-25 ENCOUNTER — Encounter: Payer: Self-pay | Admitting: Family Medicine

## 2017-05-25 DIAGNOSIS — H8110 Benign paroxysmal vertigo, unspecified ear: Secondary | ICD-10-CM | POA: Diagnosis not present

## 2017-05-25 NOTE — Patient Instructions (Addendum)
Glad you are doing better  Lets get you into ear nose and throat if not doing better next week  Continue with therapy on Friday. I like your idea of trialing off diazepam tomorrow

## 2017-05-25 NOTE — Progress Notes (Signed)
Subjective:  SHUNTAVIA YERBY is a 66 y.o. year old very pleasant female patient who presents for/with See problem oriented charting ROS- no headache or blurry vision. Dealing with vertigo. No facial or extremity weakness. No slurred words or trouble swallowing. no blurry vision or double vision. No paresthesias. No confusion or word finding difficulties.   Past Medical History-  Patient Active Problem List   Diagnosis Date Noted  . Popliteal artery occlusion, right ([place]) 11/29/2015    Priority: High  . History of skin cancer 04/22/2016    Priority: Medium  . Hyperlipidemia 04/22/2016    Priority: Medium  . THROMBOCYTOPENIA, CHRONIC 11/13/2009    Priority: Medium  . GERD 07/10/2008    Priority: Medium  . Lumbar radiculopathy 11/20/2015    Priority: Low  . Gluteal tendinitis of left buttock 11/14/2015    Priority: Low  . Tensor fascia lata syndrome 10/08/2015    Priority: Low  . Hemorrhoids     Priority: Low  . Thrombosed external hemorrhoid 10/19/2014    Priority: Low  . Anal fissure - posterior 01/29/2014    Priority: Low  . CHEST PAIN 01/10/2009    Priority: Low  . Osteopenia of left femoral neck 09/18/2008    Priority: Low  . BENIGN POSITIONAL VERTIGO 01/18/2008    Priority: Low    Medications- reviewed and updated Current Outpatient Prescriptions  Medication Sig Dispense Refill  . acetaminophen (TYLENOL) 500 MG tablet Take 1,000 mg by mouth at bedtime.    Marland Kitchen aspirin EC 81 MG tablet Take 81 mg by mouth daily.    . calcium elemental as carbonate (ANTACID MAXIMUM) 400 MG chewable tablet Chew 2,000 mg by mouth daily.    . Cholecalciferol (VITAMIN D3) 2000 units capsule Take 2,000 Units by mouth daily.    . diazepam (VALIUM) 5 MG tablet Take 1 tablet by mouth 3 (three) times daily.  0  . meclizine (ANTIVERT) 25 MG tablet Take 1 tablet (25 mg total) by mouth 3 (three) times daily as needed for dizziness. 30 tablet 0  . omeprazole (PRILOSEC) 20 MG capsule TAKE ONE CAPSULE BY  MOUTH TWICE DAILY 180 capsule 3  . tretinoin (RETIN-A) 0.05 % cream Apply 1 application topically at bedtime.     . Wheat Dextrin (BENEFIBER) POWD Take 1 scoop by mouth daily.     Objective: BP (!) 122/92 (BP Location: Left Arm, Patient Position: Sitting, Cuff Size: Normal)   Pulse 69   Temp 98.3 F (36.8 C) (Oral)   Ht 5\' 7"  (1.702 m)   Wt 151 lb 6.4 oz (68.7 kg)   SpO2 98%   BMI 23.71 kg/m  Gen: NAD, resting comfortably Did not examine TM CV: RRR no murmurs rubs or gallops Lungs: CTAB no crackles, wheeze, rhonchi Ext: no edema Skin: warm, dry Neuro: CN II-XII intact, sensation and reflexes normal throughout, 5/5 muscle strength in bilateral upper and lower extremities. Normal finger to nose. Normal rapid alternating movements. No pronator drift. Normal romberg. Normal gait.    Assessment/Plan:  Patient Instructions  Glad you are doing better  Lets get you into ear nose and throat if not doing better next week  Continue with therapy on Friday. I like your idea of trialing off diazepam tomorrow   BENIGN POSITIONAL VERTIGO S: vertigo history with recent flare up starting about 6 weeks ago. Had offered vestibular rehab 3 weeks ago but she declined at that time stating symptoms rather mild. Refilled meclizine. Patient then out of town and scheduled meclizine  but with continue dissues. Episodes 3-5 minutes but then just felt off for up to an hour afterwards. She went ot urgent care when out of town around the 18th and was given Zofran (never took this), meclizine 50mg  TID and prednisone taper. Then had to go to ER as symptoms worsened- was told to stop the prednisone. Felt really shaky on 50mg  of meclizine. Felt jittery on prednisone as well. 5mg  of valium given from ER significantly helped her. Next day took valium 5mg  (finally felt like she could stand up)- came home on Friday and started with 2.5mg  valium (saturday, Sunday, Monday- because meclizine made her sick)- much less  dizziness.   Noted some improvement today after vestibular rehab on Friday.   We were able to get her into PT yesterday and she states she had significant improvement A/P: Strongly suspect BPV with good response to vestibular rehab. No tinnitus or hearing loss to suggest meniere's. No recent viral illness and symptoms worse with head movement so less likely vestibular neuritis (though still in differential). Doubt cva with 6 weeks of symptoms and no findings on CT.   Follow up rehab on Friday- if symptoms persist into next week and not continuing to improve agrees to referral to ENT   Meds ordered this encounter  Medications  . diazepam (VALIUM) 5 MG tablet    Sig: Take 1 tablet by mouth 3 (three) times daily.    Refill:  0   The duration of face-to-face time during this visit was greater than 15 minutes. Greater than 50% of this time was spent in counseling, explanation of diagnosis, planning of further management, and/or coordination of care including discussion of difficulties of last few weeks, planning for future steps.    Return precautions advised.  Garret Reddish, MD

## 2017-05-25 NOTE — Assessment & Plan Note (Signed)
S: vertigo history with recent flare up starting about 6 weeks ago. Had offered vestibular rehab 3 weeks ago but she declined at that time stating symptoms rather mild. Refilled meclizine. Patient then out of town and scheduled meclizine but with continue dissues. Episodes 3-5 minutes but then just felt off for up to an hour afterwards. She went ot urgent care when out of town around the 18th and was given Zofran (never took this), meclizine 50mg  TID and prednisone taper. Then had to go to ER as symptoms worsened- was told to stop the prednisone. Felt really shaky on 50mg  of meclizine. Felt jittery on prednisone as well. 5mg  of valium given from ER significantly helped her. Next day took valium 5mg  (finally felt like she could stand up)- came home on Friday and started with 2.5mg  valium (saturday, Sunday, Monday- because meclizine made her sick)- much less dizziness.   Noted some improvement today after vestibular rehab on Friday.   We were able to get her into PT yesterday and she states she had significant improvement A/P: Strongly suspect BPV with good response to vestibular rehab. No tinnitus or hearing loss to suggest meniere's. No recent viral illness and symptoms worse with head movement so less likely vestibular neuritis (though still in differential). Doubt cva with 6 weeks of symptoms and no findings on CT.   Follow up rehab on Friday- if symptoms persist into next week and not continuing to improve agrees to referral to ENT

## 2017-05-26 ENCOUNTER — Encounter: Payer: Self-pay | Admitting: Family Medicine

## 2017-06-08 IMAGING — DX DG HIP (WITH OR WITHOUT PELVIS) 2-3V*L*
2 series · 2 of 2 positions shown · non-contrast
Comparison: None.

CLINICAL DATA: Chronic pain.  Worsening for 3 months.

EXAM:
DG HIP (WITH OR WITHOUT PELVIS) 2-3V LEFT

[hip ap]
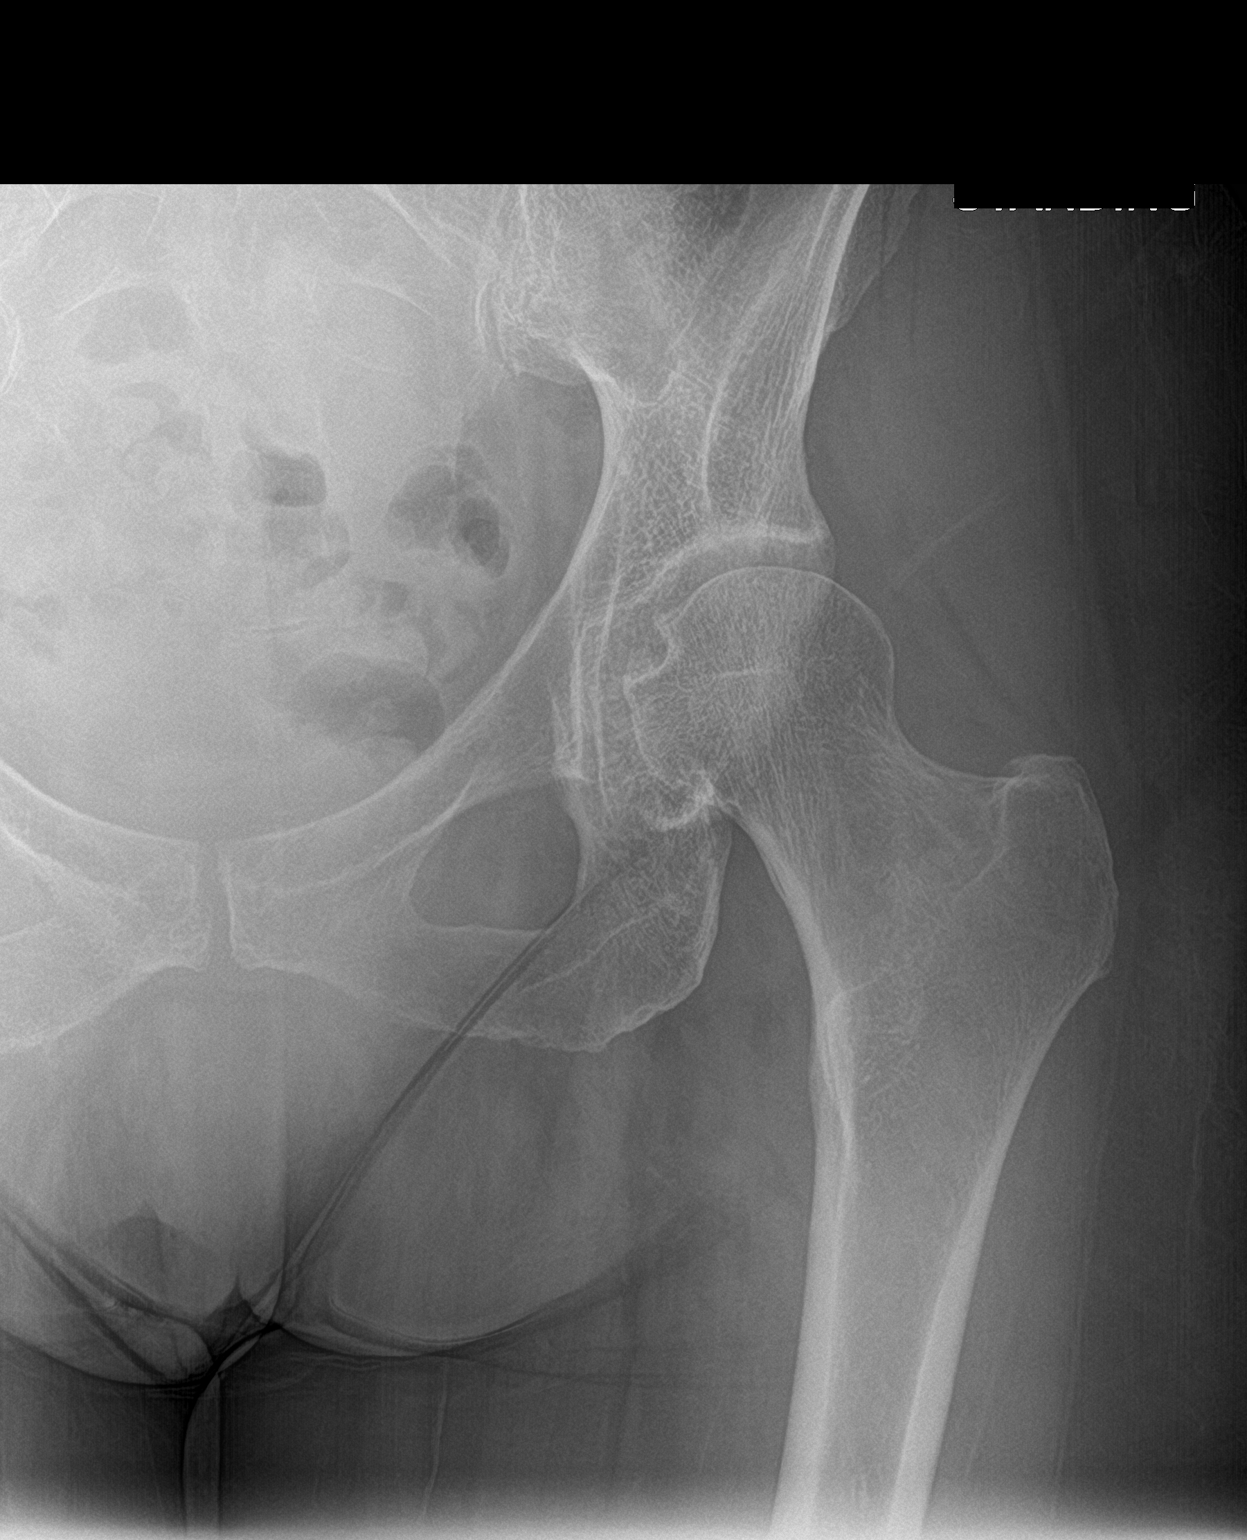

[hip lat]
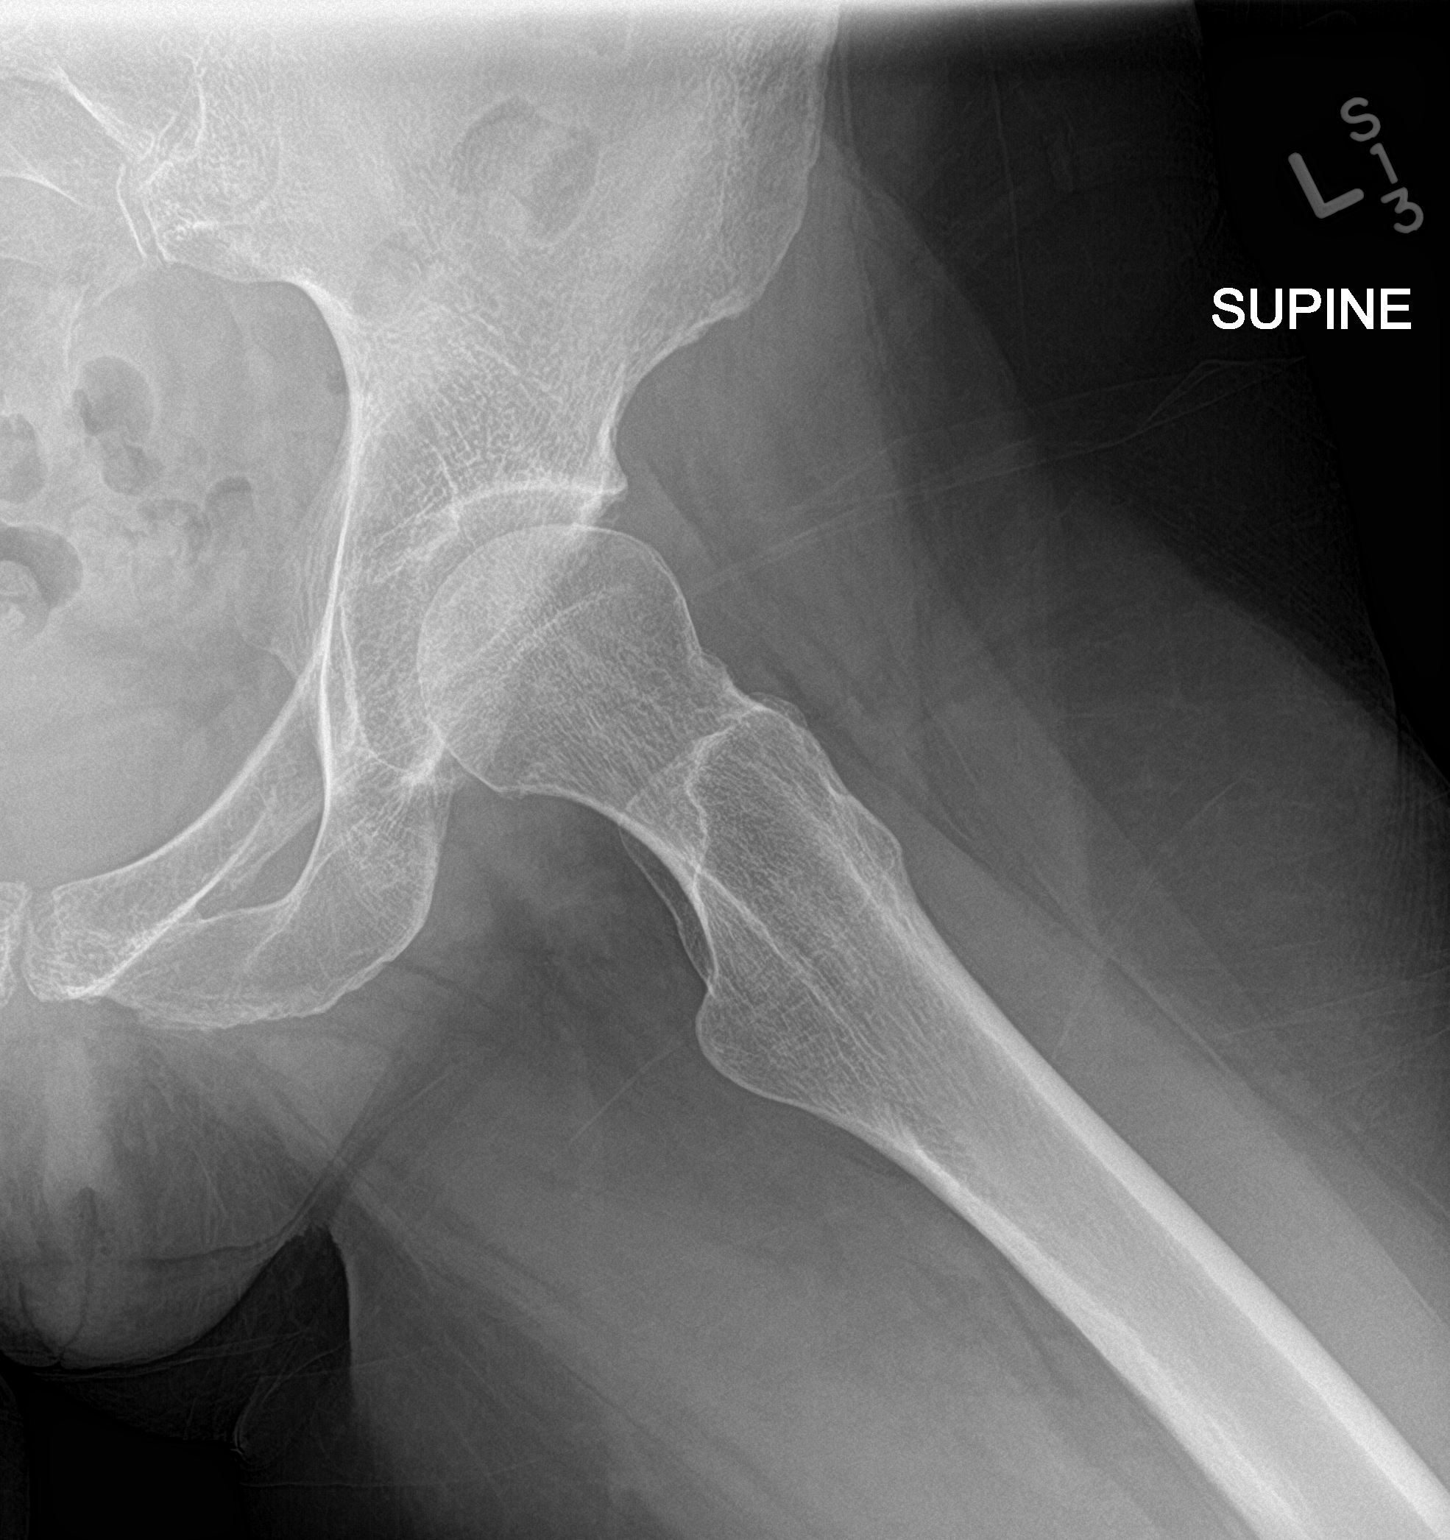

[2 of 2 positions shown; findings below may reference images not displayed]

FINDINGS: There is no evidence of hip fracture or dislocation. There is no
evidence of arthropathy or other focal bone abnormality.
IMPRESSION: No acute osseous injury of the left hip.

## 2017-08-12 ENCOUNTER — Encounter: Payer: Self-pay | Admitting: Family Medicine

## 2017-08-19 ENCOUNTER — Ambulatory Visit (INDEPENDENT_AMBULATORY_CARE_PROVIDER_SITE_OTHER): Payer: BLUE CROSS/BLUE SHIELD

## 2017-08-19 ENCOUNTER — Ambulatory Visit (INDEPENDENT_AMBULATORY_CARE_PROVIDER_SITE_OTHER): Payer: BLUE CROSS/BLUE SHIELD | Admitting: Orthopaedic Surgery

## 2017-08-19 ENCOUNTER — Encounter (INDEPENDENT_AMBULATORY_CARE_PROVIDER_SITE_OTHER): Payer: Self-pay | Admitting: Orthopaedic Surgery

## 2017-08-19 VITALS — BP 111/67 | HR 78 | Ht 67.0 in | Wt 145.0 lb

## 2017-08-19 DIAGNOSIS — M545 Low back pain: Secondary | ICD-10-CM

## 2017-08-19 DIAGNOSIS — M542 Cervicalgia: Secondary | ICD-10-CM | POA: Diagnosis not present

## 2017-08-19 NOTE — Progress Notes (Signed)
Office Visit Note   Patient: Elizabeth Douglas           Date of Birth: 26-Feb-1951           MRN: [id number] Visit Date: 08/19/2017              Requested by: Marin Olp, MD [address] [place], [place] [address] PCP: Marin Olp, MD   Assessment & Plan: Visit Diagnoses:  1. Acute bilateral low back pain, with sciatica presence unspecified   2. Neck pain     Plan: Patient has good arterial exam. She has some mild x-ray changes suggesting minimal disc degeneration. Restriction of hip range of motion suggesting hip osteoarthritis not terribly symptomatic on physical exam. We discussed exercise program I recommend she avoid impact loading lower extremity she can use the bike, treadmill, elliptical as before and gradually increase her exercise program. No indication at this point proceeding with the MRI scan based on her current symptoms. Office follow-up if she develops radicular symptoms or claudication symptoms which we discussed in detail. X-ray were reviewed with her gave her copies of the images.  Follow-Up Instructions: No Follow-up on file.   Orders:  Orders Placed This Encounter  Procedures  . XR Cervical Spine 2 or 3 views  . XR Lumbar Spine 2-3 Views   No orders of the defined types were placed in this encounter.     Procedures: No procedures performed   Clinical Data: No additional findings.   Subjective: Chief Complaint  Patient presents with  . Lower Back - Pain  . Neck - Pain    HPI 66 year old female seen with history of low back pain and neck pain has been chronic. She been on a walking program started to do a little bit of jogging. Walking was a fast walk. She started jogging she started having increased pain in her groin and radiated in her thighs and some down her right leg more than left on the lateral side of her foot. She sees a chiropractor regularly does icing stretching and use Tylenol. She said history of popliteal clot and ended  up having popliteal bypass. ABIs since that time have been good. She has as reflux avoids anti-inflammatories. She uses a treadmill elliptical and bike at home. She denies associated bowel or bladder symptoms no previous lumbar surgery. Back pain seems to worsen her neck pain.  Review of Systems positive for previous lung surgery 2007. Acute left artery occlusion 2016 with femoropopliteal bypass. Distress or reflux. Negative for DVT. Negative for PE. Otherwise negative as it pertains to her history of present illness. 14 point systems reviewed. She's never smoked. Past history of vertigo that was short-lived.   Objective: Vital Signs: BP 111/67   Pulse 78   Ht 5\' 7"  (1.702 m)   Wt 145 lb (65.8 kg)   BMI 22.71 kg/m   Physical Exam  Constitutional: She is oriented to person, place, and time. She appears well-developed.  HENT:  Head: Normocephalic.  Right Ear: External ear normal.  Left Ear: External ear normal.  Eyes: Pupils are equal, round, and reactive to light.  Neck: No tracheal deviation present. No thyromegaly present.  Cardiovascular: Normal rate.   Pulmonary/Chest: Effort normal.  Abdominal: Soft.  Neurological: She is alert and oriented to person, place, and time.  Skin: Skin is warm and dry.  Psychiatric: She has a normal mood and affect. Her behavior is normal.    Ortho Exam patient has good cervical  flexion-extension of brachial plexus tenderness negative Spurling negative for meat or subclavicular lymphadenopathy no rash or expose skin upper extremity reflexes 2+ symmetrical. She has limitation of right hip internal rotation of only 15. Opposite left has 30. Difficulty with figure for this not particularly painful. Negative straight leg raising to 90. Reflexes are 2+ distal pulses are 2+. Well-healed saphenous donor scar and femoropopliteal. Sensory testing is normal negative popped of compression test no Baker's cyst no knee effusion knee exam is stable. Specialty  Comments:  No specialty comments available.  Imaging: No results found.   PMFS History: Patient Active Problem List   Diagnosis Date Noted  . History of skin cancer 04/22/2016  . Hyperlipidemia 04/22/2016  . Popliteal artery occlusion, right ([place]) 11/29/2015  . Lumbar radiculopathy 11/20/2015  . Gluteal tendinitis of left buttock 11/14/2015  . Tensor fascia lata syndrome 10/08/2015  . Hemorrhoids   . Thrombosed external hemorrhoid 10/19/2014  . Anal fissure - posterior 01/29/2014  . THROMBOCYTOPENIA, CHRONIC 11/13/2009  . CHEST PAIN 01/10/2009  . Osteopenia of left femoral neck 09/18/2008  . GERD 07/10/2008  . BENIGN POSITIONAL VERTIGO 01/18/2008   Past Medical History:  Diagnosis Date  . Anal fissure   . Atypical chest pain   . Basal cell carcinoma   . Excess or deficiency of vitamin D   . Gallbladder polyp   . GERD (gastroesophageal reflux disease)   . Hemorrhoids   . Microhematuria   . Osteopenia   . Thrombocytopenia ([place])     Family History  Problem Relation Age of Onset  . COPD Mother   . Osteoporosis Mother   . Prostate cancer Father   . Heart disease Brother 70       Before age 34  . Heart attack Brother 42       Massive  . Lymphoma Brother        non and hodgkins  . Coronary artery disease Brother   . Colon cancer Neg Hx   . Colon polyps Neg Hx   . Rectal cancer Neg Hx   . Stomach cancer Neg Hx     Past Surgical History:  Procedure Laterality Date  . COLONOSCOPY  2005, 2015  . INTRAOPERATIVE ARTERIOGRAM Right 11/29/2015   Procedure: INTRA OPERATIVE ARTERIOGRAM;  Surgeon: Conrad Riverton, MD;  Location: [facility];  Service: Vascular;  Laterality: Right;  . LUNG BIOPSY    . THROMBECTOMY FEMORAL ARTERY Right 11/29/2015   Procedure:  RIGHT  POPLITEAL to anterior tibial and posterior tibial ARTERY thrombectomy. Right above knee to below knee popliteal artery bypass;  Surgeon: Conrad Opal, MD;  Location: [facility];  Service: Vascular;  Laterality: Right;  .  UPPER GASTROINTESTINAL ENDOSCOPY  2009   Social History   Occupational History  . Retired    Social History Main Topics  . Smoking status: Never Smoker  . Smokeless tobacco: Never Used  . Alcohol use No  . Drug use: No  . Sexual activity: Not on file

## 2017-09-02 ENCOUNTER — Encounter: Payer: Self-pay | Admitting: Family Medicine

## 2017-09-04 ENCOUNTER — Encounter (INDEPENDENT_AMBULATORY_CARE_PROVIDER_SITE_OTHER): Payer: Self-pay | Admitting: Orthopaedic Surgery

## 2017-09-14 ENCOUNTER — Encounter: Payer: Self-pay | Admitting: Vascular Surgery

## 2017-09-16 ENCOUNTER — Ambulatory Visit (INDEPENDENT_AMBULATORY_CARE_PROVIDER_SITE_OTHER): Payer: BLUE CROSS/BLUE SHIELD | Admitting: Orthopaedic Surgery

## 2017-09-16 ENCOUNTER — Ambulatory Visit (INDEPENDENT_AMBULATORY_CARE_PROVIDER_SITE_OTHER): Payer: BLUE CROSS/BLUE SHIELD

## 2017-09-16 ENCOUNTER — Encounter (INDEPENDENT_AMBULATORY_CARE_PROVIDER_SITE_OTHER): Payer: Self-pay | Admitting: Orthopaedic Surgery

## 2017-09-16 VITALS — BP 115/81 | HR 83 | Ht 67.0 in | Wt 145.0 lb

## 2017-09-16 DIAGNOSIS — M25551 Pain in right hip: Secondary | ICD-10-CM

## 2017-09-16 DIAGNOSIS — M25552 Pain in left hip: Secondary | ICD-10-CM

## 2017-09-16 MED ORDER — LIDOCAINE HCL 1 % IJ SOLN
0.5000 mL | INTRAMUSCULAR | Status: AC | PRN
  Administered 2017-09-16: .5 mL

## 2017-09-16 MED ORDER — BUPIVACAINE HCL 0.5 % IJ SOLN
2.0000 mL | INTRAMUSCULAR | Status: AC | PRN
  Administered 2017-09-16: 2 mL via INTRA_ARTICULAR

## 2017-09-16 MED ORDER — METHYLPREDNISOLONE ACETATE 40 MG/ML IJ SUSP
40.0000 mg | INTRAMUSCULAR | Status: AC | PRN
  Administered 2017-09-16: 40 mg via INTRA_ARTICULAR

## 2017-09-16 NOTE — Progress Notes (Signed)
Office Visit Note   Patient: Elizabeth Douglas           Date of Birth: 09-15-1951           MRN: [id number] Visit Date: 09/16/2017              Requested by: Marin Olp, MD [place], [place] [id number] PCP: Marin Olp, MD   Assessment & Plan: Visit Diagnoses:  1. Bilateral hip pain        With early hip osteoarthritis worse on the right than left and associated synovitis.  Plan: Intra-articular injection right hip worked well and she is able ambulate up and down the hall with no pain postinjection.  Follow-Up Instructions: Return in about 6 weeks (around 10/28/2017).   Orders:  Orders Placed This Encounter  Procedures  . Large Joint Injection/Arthrocentesis  . XR HIPS BILAT W OR W/O PELVIS 3-4 VIEWS   No orders of the defined types were placed in this encounter.     Procedures: Large Joint Inj Date/Time: 09/16/2017 9:55 AM Performed by: Marybelle Killings Authorized by: Rodell Perna C   Location:  Hip Site:  R hip joint Needle Size:  25 G Approach:  Anterior Ultrasound Guidance: No   Fluoroscopic Guidance: No   Arthrogram: No   Medications:  0.5 mL lidocaine 1 %; 2 mL bupivacaine 0.5 %; 40 mg methylPREDNISolone acetate 40 MG/ML Aspiration Attempted: No       Clinical Data: No additional findings.   Subjective: Chief Complaint  Patient presents with  . Right Hip - Pain  . Left Hip - Pain    HPI patient returns with ongoing problems with right groin pain started a few months ago when she started doing some faster walking. Pain is persisted she knows some limitation of the range of motion despite stretching, chiropractic treatments and massage treatments. She has problems with figure-of-four positioning worse on the right than left. Pain is primarily in the groin and radiates down to the thigh rarely below the knee worse with activity aching at the end of the day. No bowel bladder symptoms no chills or fever.  Review of Systems updated  and unchanged from last office visit 14 point other than as mentioned in the history of present illness   Objective: Vital Signs: BP 115/81   Pulse 83   Ht 5\' 7"  (1.702 m)   Wt 145 lb (65.8 kg)   BMI 22.71 kg/m   Physical Exam  Constitutional: She is oriented to person, place, and time. She appears well-developed.  HENT:  Head: Normocephalic.  Right Ear: External ear normal.  Left Ear: External ear normal.  Eyes: Pupils are equal, round, and reactive to light.  Neck: No tracheal deviation present. No thyromegaly present.  Cardiovascular: Normal rate.   Pulmonary/Chest: Effort normal.  Abdominal: Soft.  Neurological: She is alert and oriented to person, place, and time.  Skin: Skin is warm and dry.  Psychiatric: She has a normal mood and affect. Her behavior is normal.    Ortho Exam well-healed the arterial bypass incisions right lower extremity. Distal pulses palpable. Limitation of right hip internal rotation only 20 with pain.  With figure-of-four pain with hip flexion. No lymphadenopathy in the groin. Knees reach full extension quad strength is good she has some pain in the groin with straight leg raising. Opposite left hip has 10 more internal/external rotation she can figure-of-four on the left without pain. Minimal sciatic notch tenderness mild trochanteric  bursal tenderness.  Specialty Comments:  No specialty comments available.  Imaging: Xr Hips Bilat W Or W/o Pelvis 3-4 Views  Result Date: 09/16/2017 Standing AP pelvis and lateral right and left frog-leg hip x-rays are obtained and reviewed. This shows mild hip joint narrowing. Mild uncoverage of femoral head. No soft tissue calcification. Bony architecture is otherwise normal. Impression mild hip osteoarthritis right and left.    PMFS History: Patient Active Problem List   Diagnosis Date Noted  . History of skin cancer 04/22/2016  . Hyperlipidemia 04/22/2016  . Popliteal artery occlusion, right ([place])  11/29/2015  . Lumbar radiculopathy 11/20/2015  . Gluteal tendinitis of left buttock 11/14/2015  . Tensor fascia lata syndrome 10/08/2015  . Hemorrhoids   . Thrombosed external hemorrhoid 10/19/2014  . Anal fissure - posterior 01/29/2014  . THROMBOCYTOPENIA, CHRONIC 11/13/2009  . CHEST PAIN 01/10/2009  . Osteopenia of left femoral neck 09/18/2008  . GERD 07/10/2008  . BENIGN POSITIONAL VERTIGO 01/18/2008   Past Medical History:  Diagnosis Date  . Anal fissure   . Atypical chest pain   . Basal cell carcinoma   . Excess or deficiency of vitamin D   . Gallbladder polyp   . GERD (gastroesophageal reflux disease)   . Hemorrhoids   . Microhematuria   . Osteopenia   . Thrombocytopenia ([place])     Family History  Problem Relation Age of Onset  . COPD Mother   . Osteoporosis Mother   . Prostate cancer Father   . Heart disease Brother 80       Before age 60  . Heart attack Brother 42       Massive  . Lymphoma Brother        non and hodgkins  . Coronary artery disease Brother   . Colon cancer Neg Hx   . Colon polyps Neg Hx   . Rectal cancer Neg Hx   . Stomach cancer Neg Hx     Past Surgical History:  Procedure Laterality Date  . COLONOSCOPY  2005, 2015  . INTRAOPERATIVE ARTERIOGRAM Right 11/29/2015   Procedure: INTRA OPERATIVE ARTERIOGRAM;  Surgeon: Conrad West Logan, MD;  Location: [facility];  Service: Vascular;  Laterality: Right;  . LUNG BIOPSY    . THROMBECTOMY FEMORAL ARTERY Right 11/29/2015   Procedure:  RIGHT  POPLITEAL to anterior tibial and posterior tibial ARTERY thrombectomy. Right above knee to below knee popliteal artery bypass;  Surgeon: Conrad Arrington, MD;  Location: [facility];  Service: Vascular;  Laterality: Right;  . UPPER GASTROINTESTINAL ENDOSCOPY  2009   Social History   Occupational History  . Retired    Social History Main Topics  . Smoking status: Never Smoker  . Smokeless tobacco: Never Used  . Alcohol use No  . Drug use: No  . Sexual activity: Not on file

## 2017-09-20 ENCOUNTER — Telehealth: Payer: Self-pay | Admitting: *Deleted

## 2017-09-20 NOTE — Telephone Encounter (Signed)
Patient call to c/o pain at scar at upper thigh. 2-3/10 intermittent numbness on bottom of foot . Claims both feet equal in temp and color and ROM.Denies any redness, puffiness or any sign of infection at scar area is soft to touch Saw ortho doctor on 10/18 who assess leg and pulses was treated with "an injection into my hip and Dx. Of osteoarthritis. Claims leg and hip pain has improved since this treatment. Has an apptment. To see [facility] doctor in Dec. 7th. Will keep that appointment and call us back for any worsening symptoms. She verbalizes she understand and will call back if pain does not get better.

## 2017-09-28 ENCOUNTER — Ambulatory Visit (INDEPENDENT_AMBULATORY_CARE_PROVIDER_SITE_OTHER): Payer: BLUE CROSS/BLUE SHIELD | Admitting: Orthopaedic Surgery

## 2017-10-19 ENCOUNTER — Telehealth: Payer: Self-pay | Admitting: *Deleted

## 2017-10-19 NOTE — Telephone Encounter (Signed)
Call from patient c/o painful knot at right groin x 1 month. Denies any sign of infection,no diminished color or temp, but claims "hard knot pain with walking and desires earlier appointment with Dr. Bridgett Larsson to have this checked. Offered option or making appointment with NP to have this looked at. [place] office to call patient with dates and times.

## 2017-10-20 ENCOUNTER — Ambulatory Visit: Payer: BLUE CROSS/BLUE SHIELD | Admitting: Family

## 2017-10-20 ENCOUNTER — Encounter: Payer: Self-pay | Admitting: Family

## 2017-10-20 VITALS — BP 115/78 | HR 83 | Temp 98.5°F | Resp 19 | Wt 155.7 lb

## 2017-10-20 DIAGNOSIS — I70201 Unspecified atherosclerosis of native arteries of extremities, right leg: Secondary | ICD-10-CM

## 2017-10-20 DIAGNOSIS — M5431 Sciatica, right side: Secondary | ICD-10-CM | POA: Diagnosis not present

## 2017-10-20 DIAGNOSIS — Z95828 Presence of other vascular implants and grafts: Secondary | ICD-10-CM | POA: Diagnosis not present

## 2017-10-20 NOTE — Progress Notes (Signed)
[facility] [place]   CC: soft mass at proximal right thigh x 4 weeks, right groin to knee pain x 2 months, hx of peripheral artery occlusive disease  History of Present Illness Elizabeth Douglas is a 66 y.o. female who is s/p right popliteal thrombectomy, right posterior tibial artery thrombectomy, right anterior tibial artery thrombectomy, right above-the-knee to below-the-knee popliteal bypass with ipsilateral non-reverse greater saphenous vein, open cannulation of right common femoral artery, and right leg runoff on 12/31/16by Dr. Bridgett Larsson for right popliteal occlusion of unknown etiology (possible atypical entrapment).  Dr. Bridgett Larsson and S. Rhyne PA-C last evaluated pt on 04-30-17. At that time advised BLE ABI and bypass duplex in 6 months.  She returns today with c/o soft knot just distal to her right groin that she noticed about 3-4 weeks ago, pain in right groin that radiates to medial thigh and knee started about September 2018, pain starts immediately on standing, gets worse as the day progresses, limits her ability to exercise and her daily life. Sitting stops the pain.   Pt was evaluated by ortho in September 2018 for sciatica.   She is scheduled to return on 11-04-17 for ABI's and right LE arterial duplex, and see Dr. Bridgett Larsson.   She denies any hx of stroke or TIA.    Pt Diabetic: No Pt smoker: non-smoker, second hand smoke exposure for the first 19 years of her life, her father and older siblings smoked  Pt meds include: Statin :No, states her cholesterol is good Betablocker: No ASA: Yes Other anticoagulants/antiplatelets: no  Past Medical History:  Diagnosis Date  . Anal fissure   . Atypical chest pain   . Basal cell carcinoma   . Excess or deficiency of vitamin D   . Gallbladder polyp   . GERD (gastroesophageal reflux disease)   . Hemorrhoids   . Microhematuria   . Osteopenia   . Thrombocytopenia [facility])     Social History Social History   Tobacco Use   . Smoking status: Never Smoker  . Smokeless tobacco: Never Used  Substance Use Topics  . Alcohol use: No    Alcohol/week: 0.0 oz  . Drug use: No    Family History Family History  Problem Relation Age of Onset  . COPD Mother   . Osteoporosis Mother   . Prostate cancer Father   . Heart disease Brother 68       Before age 53  . Heart attack Brother 42       Massive  . Lymphoma Brother        non and hodgkins  . Coronary artery disease Brother   . Colon cancer Neg Hx   . Colon polyps Neg Hx   . Rectal cancer Neg Hx   . Stomach cancer Neg Hx     Past Surgical History:  Procedure Laterality Date  . COLONOSCOPY  2005, 2015  . INTRAOPERATIVE ARTERIOGRAM Right 11/29/2015   Procedure: INTRA OPERATIVE ARTERIOGRAM;  Surgeon: Conrad Pine [place], MD;  Location: [place];  Service: Vascular;  Laterality: Right;  . LUNG BIOPSY    . THROMBECTOMY FEMORAL ARTERY Right 11/29/2015   Procedure:  RIGHT  POPLITEAL to anterior tibial and posterior tibial ARTERY thrombectomy. Right above knee to below knee popliteal artery bypass;  Surgeon: Conrad Mineola, MD;  Location: [place];  Service: Vascular;  Laterality: Right;  . UPPER GASTROINTESTINAL ENDOSCOPY  2009    No Known Allergies  Current Outpatient Medications  Medication Sig Dispense Refill  .  acetaminophen (TYLENOL) 500 MG tablet Take 1,000 mg by mouth at bedtime.    Marland Kitchen aspirin EC 81 MG tablet Take 81 mg by mouth daily.    . calcium elemental as carbonate (ANTACID MAXIMUM) 400 MG chewable tablet Chew 2,000 mg by mouth daily.    . Cholecalciferol (VITAMIN D3) 2000 units capsule Take 2,000 Units by mouth daily.    Marland Kitchen FLUARIX QUADRIVALENT 0.5 ML injection ADM 0.5ML IM UTD  0  . omeprazole (PRILOSEC) 20 MG capsule TAKE ONE CAPSULE BY MOUTH TWICE DAILY 180 capsule 3  . tretinoin (RETIN-A) 0.05 % cream Apply 1 application topically at bedtime.     . Wheat Dextrin (BENEFIBER) POWD Take 1 scoop by mouth daily.     No current facility-administered  medications for this visit.     ROS: See HPI for pertinent positives and negatives.   Physical Examination  Vitals:   10/20/17 1002  BP: 115/78  Pulse: 83  Resp: 19  Temp: 98.5 F (36.9 C)  TempSrc: Oral  SpO2: 99%  Weight: 155 lb 11.2 oz (70.6 kg)   Body mass index is 24.39 kg/m.  General: A&O x 3, WDWN female  Pulmonary: Sym exp, respirations are non labored, good air movt, CTAB, no rales, rhonchi, or wheezing  Cardiac: RRR, no detected murmur  Vascular: Vessel Right Left  Radial Palpable Palpable  Brachial Palpable Palpable  Carotid Palpable, without bruit Palpable, without bruit  Aorta Not palpable N/A  Femoral 2-3+ palpable 2-3+ palpable  Popliteal 2+ palpable 1+ palpable  PT 1+Palpable 1+Palpable  DP 2+Palpable 2+Palpable   Gastrointestinal: soft, NTND, no G/R, no HSM, no masses, no CVAT B  Musculoskeletal: M/S 5/5 throughout , Extremities without ischemic changes, incisions are healed. Soft flat mass just distal to right groin that is not noticeable, barely palpable, unless pt points it out.   Neurologic: Pain and light touch intact in extremities , Motor exam as listed above. Pain elicited in right hip with right straight leg raise against resistance. No pain elicited in left hip with the same test.   Skin:  Incisions are well healed.     ASSESSMENT: Elizabeth Douglas is a 66 y.o. female who c/o soft knot just distal to her right groin that she noticed about 3-4 weeks ago, pain in right groin that radiates to medial thigh and knee started about September 2018, pain starts immediately on standing, gets worse as the day progresses, limits her ability to exercise and her daily life. Sitting stops the pain.   She is s/p is s/p right popliteal thrombectomy, right posterior tibial artery thrombectomy, right anterior tibial artery thrombectomy, right above-the-knee to below-the-knee popliteal bypass with ipsilateral non-reverse greater saphenous vein, open  cannulation of right common femoral artery, and right leg runoff on 12/31/16by Dr. Bridgett Larsson for right popliteal occlusion of unknown etiology (possible atypical entrapment).   Pt was evaluated by ortho in September 2018 for sciatica.  Her bilateral pedal pulses are palpable. The mass of concern to pt is barely visible or palpable at her anterior proximal thigh, is soft and flat, non pulsatile, no discoloration. Pain elicited in right hip with right straight leg raise against resistance, no pain in the left hip or back with this maneuver.  This does not seem like claudication, mass is barely noticeable or palpable compared to surrounding tissue.  I advised her to see her spine specialist re her right medial thigh pain, which is consistent with sciatic pain.     DATA None today  PLAN:  Based on the patient's vascular studies and examination, pt will return to clinic on 11-04-17 as scheduled with ABI's, right LE arterial Duplex, and see Dr. Bridgett Larsson.   I discussed in depth with the patient the nature of atherosclerosis, and emphasized the importance of maximal medical management including strict control of blood pressure, blood glucose, and lipid levels, obtaining regular exercise, and continued cessation of smoking.  The patient is aware that without maximal medical management the underlying atherosclerotic disease process will progress, limiting the benefit of any interventions.  The patient was given information about PAD including signs, symptoms, treatment, what symptoms should prompt the patient to seek immediate medical care, and risk reduction measures to take.  Clemon Chambers, RN, MSN, FNP-C [facility] Phone: [phone]  Clinic MD: Scot Dock  10/20/17 10:21 AM

## 2017-11-02 NOTE — Progress Notes (Signed)
Established Previous Bypass   History of Present Illness   Elizabeth Douglas is a 66 y.o. (08/16/1951) female who presents with chief complaint: bilateral hip and thigh pain.  Previous operation(s) include:   1. Right popliteal thrombectomy 2. Right posterior tibial artery thrombectomy  3. Right anterior tibial artery thrombectomy  4. Right above-the-knee to below-the-knee popliteal bypass with ipsilateral non-reverse greater saphenous vein  5. Open cannulation of right common femoral artery  6. Right leg runoff (Date: 11/30/15).  The patient's prior R leg symptoms are unchanged.  She now as a radiating pain from the hip down the medial right leg.  She was also concerned with a possible "knot" in right groin.  The patient notes a sensation of instability when she walks.  The patient's treatment regimen currently included: maximal medical management.  Pt had R hip injected in Oct 2018 with Dr. Lorin Mercy for early hip OA.  The patient's PMH, PSH, SH, and FamHx are unchanged from 10/20/17.  Current Outpatient Medications  Medication Sig Dispense Refill  . acetaminophen (TYLENOL) 500 MG tablet Take 1,000 mg by mouth at bedtime.    Marland Kitchen aspirin EC 81 MG tablet Take 81 mg by mouth daily.    . calcium elemental as carbonate (ANTACID MAXIMUM) 400 MG chewable tablet Chew 2,000 mg by mouth daily.    . Cholecalciferol (VITAMIN D3) 2000 units capsule Take 2,000 Units by mouth daily.    Marland Kitchen FLUARIX QUADRIVALENT 0.5 ML injection ADM 0.5ML IM UTD  0  . omeprazole (PRILOSEC) 20 MG capsule TAKE ONE CAPSULE BY MOUTH TWICE DAILY 180 capsule 3  . tretinoin (RETIN-A) 0.05 % cream Apply 1 application topically at bedtime.     . Wheat Dextrin (BENEFIBER) POWD Take 1 scoop by mouth daily.     No current facility-administered medications for this visit.     On ROS today: gait instability, bilateral hip and thigh pain   Physical Examination   Vitals:   11/05/17 1347  BP: 111/78  Pulse: 80  Resp: 18   SpO2: 96%  Weight: 156 lb (70.8 kg)  Height: 5\' 7"  (1.702 m)   Body mass index is 24.43 kg/m.  General Alert, O x 3, WD, NAD  Pulmonary Sym exp, good B air movt, CTA B  Cardiac RRR, Nl S1, S2, no Murmurs, No rubs, No S3,S4  Vascular Vessel Right Left  Radial Palpable Palpable  Brachial Palpable Palpable  Carotid Palpable, No Bruit Palpable, No Bruit  Aorta Not palpable N/A  Femoral Palpable Palpable  Popliteal Not palpable Not palpable  PT Palpable Palpable  DP Palpable Palpable    Gastro- intestinal soft, non-distended, non-tender to palpation, No guarding or rebound, no HSM, no masses, no CVAT B, No palpable prominent aortic pulse,    Musculo- skeletal M/S 5/5 throughout  , Extremities without ischemic changes  , No edema present, No visible varicosities , No Lipodermatosclerosis present, R groin incision with some asx without ballotable or firm mass  Neurologic Pain and light touch intact in extremities , Motor exam as listed above    Non-Invasive Vascular Imaging ABI (11/05/2017)  R:   ABI: 1.22 (1.04),   PT: tri  DP: tri  TBI:  1.00  L:   ABI: 1.17 (1.25),   PT: tri  DP: tri  TBI: 0.76  RLE Arterial Duplex (11/05/2017)  Triphasic flow throughout   Medical Decision Making   KARTER HAIRE is a 66 y.o. female who presents with: s/p R pop, PTA  and ATA  TE, R AK to BK pop bypass with ips NR GSV, B hip and thigh pain (non-vasculogenic)   Pt's ABI are normal which suggests normal aortoiliac arterial flow, so I doubt any arterial disease as the source of the hip and thigh pain.  The "knot" the patient is concerned with looks like some fat involution along with subcutaneous scarring that has resulted in asymmetry.  I don't have a clinical concern with this.  The patient report decline in functional status appears to have a more global etiology, so further rheumatologic work-up might be in order.  Based on the patient's vascular studies and examination, I  have offered the patient: BLE ABI and bypass duplex in 6 months per pt's preference  I discussed in depth with the patient the nature of atherosclerosis, and emphasized the importance of maximal medical management including strict control of blood pressure, blood glucose, and lipid levels, obtaining regular exercise, and cessation of smoking.    The patient is aware that without maximal medical management the underlying atherosclerotic disease process will progress, limiting the benefit of any interventions.  I discussed in depth with the patient the need for long term surveillance to improve the primary assisted patency of his bypass.  The patient agrees to cooperate with such. The patient is currently not on a statin though she might benefit given her LDL >70.  Currently with this patient's sx, I doubt she would be willing to start such.  The patient is currently on an anti-platelet: ASA.  Thank you for allowing [place] to participate in this patient's care.   [name], MD, FACS [facility] [place] Office: [phone] Pager: [phone]

## 2017-11-05 ENCOUNTER — Ambulatory Visit (HOSPITAL_COMMUNITY)
Admission: RE | Admit: 2017-11-05 | Discharge: 2017-11-05 | Disposition: A | Discharge status: Home / Self Care | Payer: BLUE CROSS/BLUE SHIELD | Source: Ambulatory Visit | Attending: Vascular Surgery | Admitting: Vascular Surgery

## 2017-11-05 ENCOUNTER — Encounter: Payer: Self-pay | Admitting: Vascular Surgery

## 2017-11-05 ENCOUNTER — Ambulatory Visit (INDEPENDENT_AMBULATORY_CARE_PROVIDER_SITE_OTHER)
Admission: RE | Admit: 2017-11-05 | Discharge: 2017-11-05 | Disposition: A | Discharge status: Home / Self Care | Payer: BLUE CROSS/BLUE SHIELD | Source: Ambulatory Visit | Attending: Vascular Surgery | Admitting: Vascular Surgery

## 2017-11-05 ENCOUNTER — Ambulatory Visit (INDEPENDENT_AMBULATORY_CARE_PROVIDER_SITE_OTHER): Payer: BLUE CROSS/BLUE SHIELD | Admitting: Vascular Surgery

## 2017-11-05 VITALS — BP 111/78 | HR 80 | Resp 18 | Ht 67.0 in | Wt 156.0 lb

## 2017-11-05 DIAGNOSIS — I70201 Unspecified atherosclerosis of native arteries of extremities, right leg: Secondary | ICD-10-CM | POA: Insufficient documentation

## 2017-11-05 DIAGNOSIS — Z95828 Presence of other vascular implants and grafts: Secondary | ICD-10-CM | POA: Insufficient documentation

## 2017-11-12 NOTE — Addendum Note (Signed)
Addended by: Lianne Cure A on: 11/12/2017 11:29 AM   Modules accepted: Orders

## 2017-11-25 LAB — HM MAMMOGRAPHY

## 2017-11-26 ENCOUNTER — Encounter: Payer: Self-pay | Admitting: Family Medicine

## 2017-12-13 ENCOUNTER — Encounter: Payer: Self-pay | Admitting: Family Medicine

## 2017-12-29 ENCOUNTER — Ambulatory Visit: Payer: BLUE CROSS/BLUE SHIELD | Admitting: Family Medicine

## 2018-01-06 ENCOUNTER — Ambulatory Visit: Payer: BLUE CROSS/BLUE SHIELD | Admitting: Family Medicine

## 2018-01-06 ENCOUNTER — Encounter: Payer: Self-pay | Admitting: Family Medicine

## 2018-01-06 VITALS — BP 110/80 | HR 79 | Temp 98.5°F | Ht 67.0 in | Wt 156.0 lb

## 2018-01-06 DIAGNOSIS — E785 Hyperlipidemia, unspecified: Secondary | ICD-10-CM

## 2018-01-06 DIAGNOSIS — I70201 Unspecified atherosclerosis of native arteries of extremities, right leg: Secondary | ICD-10-CM

## 2018-01-06 DIAGNOSIS — K219 Gastro-esophageal reflux disease without esophagitis: Secondary | ICD-10-CM | POA: Diagnosis not present

## 2018-01-06 DIAGNOSIS — D696 Thrombocytopenia, unspecified: Secondary | ICD-10-CM | POA: Diagnosis not present

## 2018-01-06 NOTE — Assessment & Plan Note (Signed)
S: prilosec 20mg  BID- breakthrough when not on regimen. No exertional chest pain A/P: continue current medication. Consider b12 with future labs

## 2018-01-06 NOTE — Progress Notes (Signed)
Subjective:  Elizabeth Douglas is a 67 y.o. year old very pleasant female patient who presents for/with See problem oriented charting ROS- No chest pain or shortness of breath. No headache or blurry vision.  No exertional chest pain or dyspnea on exertion even with going up stairs briskly or going up significant incline - though hip has limited her lately with this  Past Medical History-  Patient Active Problem List   Diagnosis Date Noted  . Popliteal artery occlusion, right ([facility]) 11/29/2015    Priority: High  . History of skin cancer 04/22/2016    Priority: Medium  . Hyperlipidemia 04/22/2016    Priority: Medium  . THROMBOCYTOPENIA, CHRONIC 11/13/2009    Priority: Medium  . GERD 07/10/2008    Priority: Medium  . Lumbar radiculopathy 11/20/2015    Priority: Low  . Gluteal tendinitis of left buttock 11/14/2015    Priority: Low  . Tensor fascia lata syndrome 10/08/2015    Priority: Low  . Hemorrhoids     Priority: Low  . Thrombosed external hemorrhoid 10/19/2014    Priority: Low  . Anal fissure - posterior 01/29/2014    Priority: Low  . CHEST PAIN 01/10/2009    Priority: Low  . Osteopenia of left femoral neck 09/18/2008    Priority: Low  . BENIGN POSITIONAL VERTIGO 01/18/2008    Priority: Low    Medications- reviewed and updated Current Outpatient Medications  Medication Sig Dispense Refill  . acetaminophen (TYLENOL) 500 MG tablet Take 1,000 mg by mouth at bedtime.    Marland Kitchen aspirin EC 81 MG tablet Take 81 mg by mouth daily.    . calcium elemental as carbonate (ANTACID MAXIMUM) 400 MG chewable tablet Chew 2,000 mg by mouth daily.    . Cholecalciferol (VITAMIN D3) 2000 units capsule Take 2,000 Units by mouth daily.    Marland Kitchen FLUARIX QUADRIVALENT 0.5 ML injection ADM 0.5ML IM UTD  0  . omeprazole (PRILOSEC) 20 MG capsule TAKE ONE CAPSULE BY MOUTH TWICE DAILY 180 capsule 3  . tretinoin (RETIN-A) 0.05 % cream Apply 1 application topically at bedtime.     . Wheat Dextrin (BENEFIBER) POWD  Take 1 scoop by mouth daily.     No current facility-administered medications for this visit.     Objective: BP 110/80 (BP Location: Left Arm, Patient Position: Sitting, Cuff Size: Normal)   Pulse 79   Temp 98.5 F (36.9 C) (Oral)   Ht 5\' 7"  (1.702 m)   Wt 156 lb (70.8 kg)   SpO2 97%   BMI 24.43 kg/m  Gen: NAD, resting comfortably CV: RRR no murmurs rubs or gallops Lungs: CTAB no crackles, wheeze, rhonchi Abdomen: soft/nontender/nondistended/normal bowel sounds. Healthy weight Ext: no edema Skin: warm, dry  Assessment/Plan:  Patient presents for evaluation for surgical clearance for right hip replacement. She is able to complete 4 mets without chest pain or shortness of breath. Other medical conditions are controlled- we discussed she is cleared for right hip surgery. She states the plan is for anticoagulation for 3 months and with history popliteal artery occlusion this is reasonable  Hyperlipidemia S: mild poorly controlled on no rx but with that being said 10 year risk is only 3.6%. Dr. Bridgett Larsson suggested considering- statin given LDL >70 though with history popliteal artery occlusion- though this was thought to be due to abnormal vasculature in leg Lab Results  Component Value Date   CHOL 191 04/29/2017   HDL 57.60 04/29/2017   LDLCALC 119 (H) 04/29/2017   TRIG 68.0 04/29/2017  CHOLHDL 3 04/29/2017   A/P:  Is on aspirin for primary prevention. With history popliteal artery disease we discussed possibly placing on statin both 1 . Long term and 2. Around surgery. With overall risk being low based off lipids (since popliteal artery issues thought due to abnormal vasculature primarily)- we opted out of both of these today  GERD S: prilosec 20mg  BID- breakthrough when not on regimen. No exertional chest pain A/P: continue current medication. Consider b12 with future labs  THROMBOCYTOPENIA, CHRONIC S: thrombocytopenia for years in past- has seen hematology in past who did not  suggest further workup. Last few CBC in normal range Lab Results  Component Value Date   WBC 4.6 04/29/2017   HGB 14.3 04/29/2017   HCT 42.4 04/29/2017   MCV 95.2 04/29/2017   PLT 174.0 04/29/2017  A/P: mild thrombocytopenia even if it were present should not prevent surgery  Popliteal artery occlusion, right ([facility]) S: follows with Dr. Bridgett Larsson for history right popliteal artery occlusion s/p bypass. She had normal ABIs late 2018.  A/P: recent evaluation and reassuring ABIs- does not need repeat visit with Dr. Bridgett Larsson prior to surgery- has follow up planned in June   Future Appointments  Date Time Provider [facility]  05/11/2018 12:00 PM [facility]  05/11/2018  1:00 PM [facility]  05/11/2018  1:30 PM Conrad Metz, MD [facility]   Return precautions advised.  Garret Reddish, MD

## 2018-01-06 NOTE — Assessment & Plan Note (Signed)
S: thrombocytopenia for years in past- has seen hematology in past who did not suggest further workup. Last few CBC in normal range Lab Results  Component Value Date   WBC 4.6 04/29/2017   HGB 14.3 04/29/2017   HCT 42.4 04/29/2017   MCV 95.2 04/29/2017   PLT 174.0 04/29/2017  A/P: mild thrombocytopenia even if it were present should not prevent surgery

## 2018-01-06 NOTE — Assessment & Plan Note (Signed)
S: mild poorly controlled on no rx but with that being said 10 year risk is only 3.6%. Dr. Bridgett Larsson suggested considering- statin given LDL >70 though with history popliteal artery occlusion- though this was thought to be due to abnormal vasculature in leg Lab Results  Component Value Date   CHOL 191 04/29/2017   HDL 57.60 04/29/2017   LDLCALC 119 (H) 04/29/2017   TRIG 68.0 04/29/2017   CHOLHDL 3 04/29/2017   A/P:  Is on aspirin for primary prevention. With history popliteal artery disease we discussed possibly placing on statin both 1 . Long term and 2. Around surgery. With overall risk being low based off lipids (since popliteal artery issues thought due to abnormal vasculature primarily)- we opted out of both of these today

## 2018-01-06 NOTE — Assessment & Plan Note (Signed)
S: follows with Dr. Bridgett Larsson for history right popliteal artery occlusion s/p bypass. She had normal ABIs late 2018.  A/P: recent evaluation and reassuring ABIs- does not need repeat visit with Dr. Bridgett Larsson prior to surgery- has follow up planned in June

## 2018-01-06 NOTE — Patient Instructions (Signed)
Best of luck with your surgery! I certainly hope you feel better overall after your recovery and are able to get more active again

## 2018-01-21 ENCOUNTER — Ambulatory Visit: Payer: Self-pay | Admitting: Orthopedic Surgery

## 2018-01-21 NOTE — H&P (Signed)
Elizabeth Douglas, Elizabeth Douglas [phone], F) DOB 04/01/1951   Chief Complaint Right Hip Pain H&P right THA on 02/16/2018 at [facility]  Patient's Pharmacies [id number]): [address] [place], [place] [address], Ph [phone], Fax [phone]   Vitals Ht: 5 ft 7 in  Wt: 148 lbs  BMI: 23.2 BP - 118/68 Pulse - 76   Allergies Reviewed Allergies NKDA   Medications Reviewed Medications Anti Gas 01/21/18   entered Dominica Cumine Aspir-81 01/21/18   entered Lorriane Shire Cumine Fiber (psyllium husk) 01/21/18   entered Vanessa Cumine omeprazole 20 mg capsule,delayed release 09/19/17   filled PRIME Premarin 0.625 mg/gram vaginal cream 11/16/17   filled PRIME Vitamin D3 1,000 unit capsule Take by oral route. 01/21/18   entered Lorriane Shire Cumine    Reviewed Problems History of Deep vein thrombosis Osteoarthritis   Family History Reviewed Family History Father - Father deceased   - Familial prostate cancer   - Malignant tumor of kidney Mother - Chronic obstructive lung disease   - Mother deceased Unspecified Relation - Cirrhosis of liver - Sibling   - Myocardial infarction - Sibling   Social History Reviewed Social History Smoking Status: Never smoker Non-smoker Chewing tobacco: none Hand Dominance: Right Work related injury?: N Advance directive: N Medical Power of Attorney: N Live alone or with others?: (Notes: Lives with spouse) Marital status: Married   Surgical History Reviewed Surgical History Operation on lung - 2007 Vascular Surgery - 11/29/2015 - Arterial Clot Right Leg   GYN History Most Recent Bone Density: 12/01/2011. Most Recent Mammogram: 10/30/2017.   Past Medical History Reviewed Past Medical History Blood Clots: Y GERD/Reflux: Y Joint Pain: Y Previous Cortisone Injection(s): Y Notes: Arterial Blood Clot,  Vertigo,  Hemorrhoids,  Postmenopausal,  Osteopenia    HPI The patient comes in for an H&P for her right THA  on 02/16/2018 by Dr. Wynelle Link at [facility]. The patient is a 67 year old female who was seen for a second opinion. The patient reports right hip (worse than left) problems including pain, swelling and stiffness symptoms that have been present for 7 months. The symptoms began in association with a change in activity. She started becoming a little more aggressive with her exercise program. The patient reports symptoms radiating to the: right groin, right thigh, left groin, left thigh and lower back. The patient describes the hip problem as aching. The patient feels as if their symptoms are does feel they are worsening. Current treatment includes application of ice, restricted activity and non-opioid analgesics (Tylenol, prn at bedtime). Previous workup for this problem has included hip x-rays. Previous treatment for this problem has included corticosteroid injection in October. She states that the injection really only helped that day. She has also had some chiropractic treatment for her low back. She has pain with weightbearing, especially the first few steps after sitting for a while. She alos has pain when trying to rotate her hip. She states that her RIGHT hip is bothering her much more than her LEFT. Symptoms began constantly about 7 months ago and have gotten progressively worse since the onset. The pain is occurring at all times and is now limiting what she can and cannot do. She has lost a lot of motion in the RIGHT hip which is also having a very negative effect on her lifestyle. The previous intra-articular injection helped for a very short amount of time. She is at a stage now where she feels as though she needs to do  something about the hip as it is essentially taking over her life. AP pelvis and lateral of both hips show significant arthritic change in the RIGHT worse than LEFT hip. She has focal bone-on-bone changes in the superolateral hip on the RIGHT side and near bone-on-bone on the  LEFT. She has a rapidly progressive arthritis of the RIGHT hip with significant pain and dysfunction.The patient has significant pain and dysfunction in their hip. They have significant functional limitations and have failed nonoperative management. At this point the most predictable means of improving pain and function is total hip arthroplasty. Risks and benefits have been discussed with the patient and she elects to proceed with surgery.   ROS Constitutional: Constitutional: no significant weight gain or loss and no fever.  HEENT: Eyes: no irritation, dry eyes, vision change, or sore throat.  Cardiovascular: Cardiovascular: no palpitations or chest pain.  Respiratory: Respiratory: no cough or shortness of breath and No COPD.  Gastrointestinal: Gastrointestinal: heartburn and indigestion, hemorrhoids.  Genitourinary: Genitourinary: no blood in urine or difficulty urinating.  Musculoskeletal: Musculoskeletal: Joint Pain; stiffness, decreased range of motion, swelling.  Hematologic/Lymphatic: Hematologic/Lymphatic Bleed Easily; history of arterial clot.   Physical Exam Patient is a 67 year old female. Accompanied by her husband General Mental Status - Alert, cooperative and good historian. General Appearance - pleasant, Not in acute distress. Orientation - Oriented X3. Build & Nutrition - Well nourished and Well developed.  Head and Neck Head - normocephalic, atraumatic . Neck Global Assessment - supple, no bruit auscultated on the right, no bruit auscultated on the left.  Eye Wears contacts Pupil - Bilateral - PERR Motion - Bilateral - EOMI.  Chest and Lung Exam Auscultation Breath sounds - clear at anterior chest wall and clear at posterior chest wall. Adventitious sounds - No Adventitious sounds.  Cardiovascular Auscultation Rhythm - Regular rate and rhythm. Heart Sounds - S1 WNL and S2 WNL. Murmurs & Other Heart Sounds - Auscultation of the heart reveals - No  Murmurs.  Abdomen Palpation/Percussion Tenderness - Abdomen is non-tender to palpation. Abdomen is soft. Auscultation Auscultation of the abdomen reveals - Bowel sounds normal.  Genitourinary Note: Not done, not pertinent to present illness  Musculoskeletal RIGHT hip can be flexed to 100 with minimal internal rotation, 10 of external rotation and 10 of abduction. She has no trochanteric tenderness. LEFT hip can be flexed to 110 with 10 of internal rotation, 20 of external rotation and 20 of abduction. There is no trochanteric tenderness. Right knee shows no effusion. Range of motion is 0-125. There is no crepitus on range of motion of the knee. There is no medial or lateral joint line tenderness. There is no instability noted. Left knee shows no effusion. Range of motion 0-125. No medial or lateral joint line tenderness. No instability. No crepitus on range of motion. She has a significantly antalgic gait pattern on the RIGHT.  Radiographs-AP pelvis and lateral of both hips show significant arthritic change in the RIGHT worse than LEFT hip. She has focal bone-on-bone changes in the superolateral hip on the RIGHT side and near bone-on-bone on the LEFT.   Assessment / Plan 1. Osteoarthritis of hip M16.11: Unilateral primary osteoarthritis, right hip  Patient Instructions Surgical Plans: Right Total Hip Replacement - Anterior Approach Disposition: Home, HHPT PCP: Dr. Yong Channel - cleared to proceed with surgery CV: Dr. Bridgett Larsson - pending clearance Topical TXA Anesthesia Issues: Nausea and Fainting Patient was instructed on what medications to stop prior to surgery. - Follow up visit in 2  weeks with Dr. Wynelle Link - Begin physical therapy following surgery - Pre-operative lab work as pre Pre-Surgical Testing - Prescriptions will be provided in hospital at time of discharge  Return to [place], MD for [place] at [facility] on 03/01/2018 at 01:15 PM  Encounter  signed-off by Mickel Crow, PA-C

## 2018-02-07 NOTE — Patient Instructions (Addendum)
NEITA LANDRIGAN  02/07/2018   Your procedure is scheduled on: 02-16-18  Report to [facility] Main  Entrance               Report to admitting at    0600 AM   Call this number if you have problems the morning of surgery [phone]    Remember: Do not eat food or drink liquids :After Midnight.     Take these medicines the morning of surgery with A SIP OF WATER: omeprazole, eye drops                                You may not have any metal on your body including hair pins and              piercings  Do not wear jewelry, make-up, lotions, powders or perfumes, deodorant             Do not wear nail polish.  Do not shave  48 hours prior to surgery.              Do not bring valuables to the hospital. [place].  Contacts, dentures or bridgework may not be worn into surgery.  Leave suitcase in the car. After surgery it may be brought to your room.                Please read over the following fact sheets you were given: _____________________________________________________________________           [facility] - Preparing for Surgery Before surgery, you can play an important role.  Because skin is not sterile, your skin needs to be as free of germs as possible.  You can reduce the number of germs on your skin by washing with CHG (chlorahexidine gluconate) soap before surgery.  CHG is an antiseptic cleaner which kills germs and bonds with the skin to continue killing germs even after washing. Please DO NOT use if you have an allergy to CHG or antibacterial soaps.  If your skin becomes reddened/irritated stop using the CHG and inform your nurse when you arrive at Short Stay. Do not shave (including legs and underarms) for at least 48 hours prior to the first CHG shower.  You may shave your face/neck. Please follow these instructions carefully:  1.  Shower with CHG Soap the night before surgery and the  morning of  Surgery.  2.  If you choose to wash your hair, wash your hair first as usual with your  normal  shampoo.  3.  After you shampoo, rinse your hair and body thoroughly to remove the  shampoo.                           4.  Use CHG as you would any other liquid soap.  You can apply chg directly  to the skin and wash                       Gently with a scrungie or clean washcloth.  5.  Apply the CHG Soap to your body ONLY FROM THE NECK DOWN.   Do not use on face/ open  Wound or open sores. Avoid contact with eyes, ears mouth and genitals (private parts).                       Wash face,  Genitals (private parts) with your normal soap.             6.  Wash thoroughly, paying special attention to the area where your surgery  will be performed.  7.  Thoroughly rinse your body with warm water from the neck down.  8.  DO NOT shower/wash with your normal soap after using and rinsing off  the CHG Soap.                9.  Pat yourself dry with a clean towel.            10.  Wear clean pajamas.            11.  Place clean sheets on your bed the night of your first shower and do not  sleep with pets. Day of Surgery : Do not apply any lotions/deodorants the morning of surgery.  Please wear clean clothes to the hospital/surgery center.  FAILURE TO FOLLOW THESE INSTRUCTIONS MAY RESULT IN THE CANCELLATION OF YOUR SURGERY PATIENT SIGNATURE_________________________________  NURSE SIGNATURE__________________________________  ________________________________________________________________________  WHAT IS A BLOOD TRANSFUSION? Blood Transfusion Information  A transfusion is the replacement of blood or some of its parts. Blood is made up of multiple cells which provide different functions.  Red blood cells carry oxygen and are used for blood loss replacement.  White blood cells fight against infection.  Platelets control bleeding.  Plasma helps clot blood.  Other blood products are  available for specialized needs, such as hemophilia or other clotting disorders. BEFORE THE TRANSFUSION  Who gives blood for transfusions?   Healthy volunteers who are fully evaluated to make sure their blood is safe. This is blood bank blood. Transfusion therapy is the safest it has ever been in the practice of medicine. Before blood is taken from a donor, a complete history is taken to make sure that person has no history of diseases nor engages in risky social behavior (examples are intravenous drug use or sexual activity with multiple partners). The donor's travel history is screened to minimize risk of transmitting infections, such as malaria. The donated blood is tested for signs of infectious diseases, such as HIV and hepatitis. The blood is then tested to be sure it is compatible with you in order to minimize the chance of a transfusion reaction. If you or a relative donates blood, this is often done in anticipation of surgery and is not appropriate for emergency situations. It takes many days to process the donated blood. RISKS AND COMPLICATIONS Although transfusion therapy is very safe and saves many lives, the main dangers of transfusion include:   Getting an infectious disease.  Developing a transfusion reaction. This is an allergic reaction to something in the blood you were given. Every precaution is taken to prevent this. The decision to have a blood transfusion has been considered carefully by your caregiver before blood is given. Blood is not given unless the benefits outweigh the risks. AFTER THE TRANSFUSION  Right after receiving a blood transfusion, you will usually feel much better and more energetic. This is especially true if your red blood cells have gotten low (anemic). The transfusion raises the level of the red blood cells which carry oxygen, and this usually causes an energy increase.  The  nurse administering the transfusion will monitor you carefully for  complications. HOME CARE INSTRUCTIONS  No special instructions are needed after a transfusion. You may find your energy is better. Speak with your caregiver about any limitations on activity for underlying diseases you may have. SEEK MEDICAL CARE IF:   Your condition is not improving after your transfusion.  You develop redness or irritation at the intravenous (IV) site. SEEK IMMEDIATE MEDICAL CARE IF:  Any of the following symptoms occur over the next 12 hours:  Shaking chills.  You have a temperature by mouth above 102 F (38.9 C), not controlled by medicine.  Chest, back, or muscle pain.  People around you feel you are not acting correctly or are confused.  Shortness of breath or difficulty breathing.  Dizziness and fainting.  You get a rash or develop hives.  You have a decrease in urine output.  Your urine turns a dark color or changes to pink, red, or brown. Any of the following symptoms occur over the next 10 days:  You have a temperature by mouth above 102 F (38.9 C), not controlled by medicine.  Shortness of breath.  Weakness after normal activity.  The white part of the eye turns yellow (jaundice).  You have a decrease in the amount of urine or are urinating less often.  Your urine turns a dark color or changes to pink, red, or brown. Document Released: 11/13/2000 Document Revised: 02/08/2012 Document Reviewed: 07/02/2008 ExitCare Patient Information 2014 [place].  _______________________________________________________________________  Incentive Spirometer  An incentive spirometer is a tool that can help keep your lungs clear and active. This tool measures how well you are filling your lungs with each breath. Taking long deep breaths may help reverse or decrease the chance of developing breathing (pulmonary) problems (especially infection) following:  A long period of time when you are unable to move or be active. BEFORE THE PROCEDURE   If  the spirometer includes an indicator to show your best effort, your nurse or respiratory therapist will set it to a desired goal.  If possible, sit up straight or lean slightly forward. Try not to slouch.  Hold the incentive spirometer in an upright position. INSTRUCTIONS FOR USE  1. Sit on the edge of your bed if possible, or sit up as far as you can in bed or on a chair. 2. Hold the incentive spirometer in an upright position. 3. Breathe out normally. 4. Place the mouthpiece in your mouth and seal your lips tightly around it. 5. Breathe in slowly and as deeply as possible, raising the piston or the ball toward the top of the column. 6. Hold your breath for 3-5 seconds or for as long as possible. Allow the piston or ball to fall to the bottom of the column. 7. Remove the mouthpiece from your mouth and breathe out normally. 8. Rest for a few seconds and repeat Steps 1 through 7 at least 10 times every 1-2 hours when you are awake. Take your time and take a few normal breaths between deep breaths. 9. The spirometer may include an indicator to show your best effort. Use the indicator as a goal to work toward during each repetition. 10. After each set of 10 deep breaths, practice coughing to be sure your lungs are clear. If you have an incision (the cut made at the time of surgery), support your incision when coughing by placing a pillow or rolled up towels firmly against it. Once you are able to get  out of bed, walk around indoors and cough well. You may stop using the incentive spirometer when instructed by your caregiver.  RISKS AND COMPLICATIONS  Take your time so you do not get dizzy or light-headed.  If you are in pain, you may need to take or ask for pain medication before doing incentive spirometry. It is harder to take a deep breath if you are having pain. AFTER USE  Rest and breathe slowly and easily.  It can be helpful to keep track of a log of your progress. Your caregiver can  provide you with a simple table to help with this. If you are using the spirometer at home, follow these instructions: [place] IF:   You are having difficultly using the spirometer.  You have trouble using the spirometer as often as instructed.  Your pain medication is not giving enough relief while using the spirometer.  You develop fever of 100.5 F (38.1 C) or higher. SEEK IMMEDIATE MEDICAL CARE IF:   You cough up bloody sputum that had not been present before.  You develop fever of 102 F (38.9 C) or greater.  You develop worsening pain at or near the incision site. MAKE SURE YOU:   Understand these instructions.  Will watch your condition.  Will get help right away if you are not doing well or get worse. Document Released: 03/29/2007 Document Revised: 02/08/2012 Document Reviewed: 05/30/2007 [facility] Patient Information 2014 [place], [place].   ________________________________________________________________________

## 2018-02-07 NOTE — Progress Notes (Signed)
Clearance Dr. Bridgett Larsson Vascular 12-2017 chart clearance Dr. Yong Channel 12-23-17 chart  Echo 12-11-2015 epic

## 2018-02-08 ENCOUNTER — Other Ambulatory Visit: Payer: Self-pay

## 2018-02-08 ENCOUNTER — Encounter (HOSPITAL_COMMUNITY): Payer: Self-pay

## 2018-02-08 ENCOUNTER — Encounter (HOSPITAL_COMMUNITY)
Admission: RE | Admit: 2018-02-08 | Discharge: 2018-02-08 | Disposition: A | Discharge status: Home / Self Care | Payer: BLUE CROSS/BLUE SHIELD | Source: Ambulatory Visit | Attending: Orthopedic Surgery | Admitting: Orthopedic Surgery

## 2018-02-08 ENCOUNTER — Ambulatory Visit: Payer: Self-pay | Admitting: Orthopedic Surgery

## 2018-02-08 DIAGNOSIS — Z01812 Encounter for preprocedural laboratory examination: Secondary | ICD-10-CM | POA: Diagnosis not present

## 2018-02-08 DIAGNOSIS — M1611 Unilateral primary osteoarthritis, right hip: Secondary | ICD-10-CM | POA: Diagnosis not present

## 2018-02-08 HISTORY — DX: Other specified postprocedural states: R11.2

## 2018-02-08 HISTORY — DX: Other specified postprocedural states: Z98.890

## 2018-02-08 HISTORY — DX: Unspecified osteoarthritis, unspecified site: M19.90

## 2018-02-08 HISTORY — DX: Other complications of anesthesia, initial encounter: T88.59XA

## 2018-02-08 HISTORY — DX: Adverse effect of unspecified anesthetic, initial encounter: T41.45XA

## 2018-02-08 LAB — TYPE AND SCREEN
ABO/RH(D): O POS
ANTIBODY SCREEN: POSITIVE
PT AG TYPE: NEGATIVE

## 2018-02-08 LAB — COMPREHENSIVE METABOLIC PANEL
ALBUMIN: 3.9 g/dL (ref 3.5–5.0)
ALT: 12 U/L — ABNORMAL LOW (ref 14–54)
AST: 19 U/L (ref 15–41)
Alkaline Phosphatase: 53 U/L (ref 38–126)
Anion gap: 8 (ref 5–15)
BILIRUBIN TOTAL: 0.5 mg/dL (ref 0.3–1.2)
BUN: 15 mg/dL (ref 6–20)
CO2: 29 mmol/L (ref 22–32)
Calcium: 9.2 mg/dL (ref 8.9–10.3)
Chloride: 104 mmol/L (ref 101–111)
Creatinine, Ser: 0.61 mg/dL (ref 0.44–1.00)
GFR calc Af Amer: >60 mL/min (ref >60)
GFR calc non Af Amer: >60 mL/min (ref >60)
GLUCOSE: 88 mg/dL (ref 65–99)
POTASSIUM: 4.2 mmol/L (ref 3.5–5.1)
SODIUM: 141 mmol/L (ref 135–145)
TOTAL PROTEIN: 6.8 g/dL (ref 6.5–8.1)

## 2018-02-08 LAB — SURGICAL PCR SCREEN
MRSA, PCR: NEGATIVE
Staphylococcus aureus: NEGATIVE

## 2018-02-08 LAB — CBC
HEMATOCRIT: 41.8 % (ref 36.0–46.0)
HEMOGLOBIN: 14.2 g/dL (ref 12.0–15.0)
MCH: 32.4 pg (ref 26.0–34.0)
MCHC: 34 g/dL (ref 30.0–36.0)
MCV: 95.4 fL (ref 78.0–100.0)
Platelets: 152 10*3/uL (ref 150–400)
RBC: 4.38 MIL/uL (ref 3.87–5.11)
RDW: 12.5 % (ref 11.5–15.5)
WBC: 5.1 10*3/uL (ref 4.0–10.5)

## 2018-02-08 LAB — PROTIME-INR
INR: 0.98
Prothrombin Time: 12.9 seconds (ref 11.4–15.2)

## 2018-02-08 LAB — APTT: APTT: 30 s (ref 24–36)

## 2018-02-11 ENCOUNTER — Ambulatory Visit: Payer: Self-pay | Admitting: Orthopedic Surgery

## 2018-02-11 NOTE — H&P (Signed)
Elizabeth Douglas, Elizabeth Douglas [phone], F) DOB    October 14, 1951              Chief Complaint Right Hip Pain H&P right THA on 02/16/2018 at [facility]  Patient's Pharmacies [id number]): [address] [place], [place] [address], Ph [phone], Fax [phone]   Vitals Ht:        5 ft 7 in  Wt:       148 lbs  BMI:     23.2 BP - 118/68 Pulse - 76   Allergies Reviewed Allergies NKDA   Medications Reviewed Medications Anti Gas 01/21/18   entered       Dominica Cumine Aspir-81 01/21/18   entered       Lorriane Shire Cumine Fiber (psyllium husk) 01/21/18   entered       Vanessa Cumine omeprazole 20 mg capsule,delayed release 09/19/17   filled           PRIME Premarin 0.625 mg/gram vaginal cream 11/16/17   filled           PRIME Vitamin D3 1,000 unit capsule Take by oral route. 01/21/18   entered       Lorriane Shire Cumine    Reviewed Problems History of Deep vein thrombosis Osteoarthritis   Family History Reviewed Family History Father  - Father deceased             - Familial prostate cancer             - Malignant tumor of kidney Mother - Chronic obstructive lung disease             - Mother deceased Unspecified Relation   - Cirrhosis of liver - Sibling             - Myocardial infarction - Sibling   Social History Reviewed Social History Smoking Status: Never smoker Non-smoker Chewing tobacco: none Hand Dominance: Right Work related injury?: N Advance directive: N Medical Power of Attorney: N Live alone or with others?: (Notes: Lives with spouse) Marital status: Married   Surgical History Reviewed Surgical History Operation on lung - 2007 Vascular Surgery - 11/29/2015 - Arterial Clot Right Leg   GYN History Most Recent Bone Density: 12/01/2011. Most Recent Mammogram: 10/30/2017.   Past Medical History Reviewed Past Medical History Blood Clots: Y GERD/Reflux: Y Joint Pain: Y Previous Cortisone Injection(s): Y Notes:  Arterial Blood Clot,  Vertigo,  Hemorrhoids,  Postmenopausal,  Osteopenia    HPI The patient comes in for an H&P for her right THA on 02/16/2018 by Dr. Wynelle Link at [facility]. The patient is a 67 year old female who was seen for a second opinion. The patient reports right hip (worse than left) problems including pain, swelling and stiffness symptoms that have been present for 7 months. The symptoms began in association with a change in activity. She started becoming a little more aggressive with her exercise program. The patient reports symptoms radiating to the: right groin, right thigh, left groin, left thigh and lower back. The patient describes the hip problem as aching. The patient feels as if their symptoms are does feel they are worsening. Current treatment includes application of ice, restricted activity and non-opioid analgesics (Tylenol, prn at bedtime). Previous workup for this problem has included hip x-rays. Previous treatment for this problem has included corticosteroid injection in October. She states that the injection really only helped that day. She has also had some chiropractic treatment for her low back. She has  pain with weightbearing, especially the first few steps after sitting for a while. She alos has pain when trying to rotate her hip. She states that her RIGHT hip is bothering her much more than her LEFT. Symptoms began constantly about 7 months ago and have gotten progressively worse since the onset. The pain is occurring at all times and is now limiting what she can and cannot do. She has lost a lot of motion in the RIGHT hip which is also having a very negative effect on her lifestyle. The previous intra-articular injection helped for a very short amount of time. She is at a stage now where she feels as though she needs to do something about the hip as it is essentially taking over her life. AP pelvis and lateral of both hips show significant arthritic change in  the RIGHT worse than LEFT hip. She has focal bone-on-bone changes in the superolateral hip on the RIGHT side and near bone-on-bone on the LEFT. She has a rapidly progressive arthritis of the RIGHT hip with significant pain and dysfunction.The patient has significant pain and dysfunction in their hip. They have significant functional limitations and have failed nonoperative management. At this point the most predictable means of improving pain and function is total hip arthroplasty. Risks and benefits have been discussed with the patient and she elects to proceed with surgery.   ROS Constitutional: Constitutional: no significant weight gain or loss and no fever.  HEENT: Eyes: no irritation, dry eyes, vision change, or sore throat.  Cardiovascular: Cardiovascular: no palpitations or chest pain.  Respiratory: Respiratory: no cough or shortness of breath and No COPD.  Gastrointestinal: Gastrointestinal: heartburn and indigestion, hemorrhoids.  Genitourinary: Genitourinary: no blood in urine or difficulty urinating.  Musculoskeletal: Musculoskeletal: Joint Pain; stiffness, decreased range of motion, swelling.  Hematologic/Lymphatic: Hematologic/Lymphatic Bleed Easily; history of arterial clot.   Physical Exam Patient is a 67 year old female. Accompanied by her husband General Mental Status - Alert, cooperative and good historian. General Appearance - pleasant, Not in acute distress. Orientation - Oriented X3. Build & Nutrition - Well nourished and Well developed.  Head and Neck Head - normocephalic, atraumatic . Neck Global Assessment - supple, no bruit auscultated on the right, no bruit auscultated on the left.  Eye Wears contacts Pupil - Bilateral - PERR Motion - Bilateral - EOMI.  Chest and Lung Exam Auscultation Breath sounds - clear at anterior chest wall and clear at posterior chest wall. Adventitious sounds - No Adventitious  sounds.  Cardiovascular Auscultation Rhythm - Regular rate and rhythm. Heart Sounds - S1 WNL and S2 WNL. Murmurs & Other Heart Sounds - Auscultation of the heart reveals - No Murmurs.  Abdomen Palpation/Percussion Tenderness - Abdomen is non-tender to palpation. Abdomen is soft. Auscultation Auscultation of the abdomen reveals - Bowel sounds normal.  Genitourinary Note: Not done, not pertinent to present illness  Musculoskeletal RIGHT hip can be flexed to 100 with minimal internal rotation, 10 of external rotation and 10 of abduction. She has no trochanteric tenderness. LEFT hip can be flexed to 110 with 10 of internal rotation, 20 of external rotation and 20 of abduction. There is no trochanteric tenderness. Right knee shows no effusion. Range of motion is 0-125. There is no crepitus on range of motion of the knee. There is no medial or lateral joint line tenderness. There is no instability noted. Left knee shows no effusion. Range of motion 0-125. No medial or lateral joint line tenderness. No instability. No crepitus on range of  motion. She has a significantly antalgic gait pattern on the RIGHT.  Radiographs-AP pelvis and lateral of both hips show significant arthritic change in the RIGHT worse than LEFT hip. She has focal bone-on-bone changes in the superolateral hip on the RIGHT side and near bone-on-bone on the LEFT.   Assessment / Plan 1. Osteoarthritis of hip M16.11: Unilateral primary osteoarthritis, right hip  Patient Instructions Surgical Plans: Right Total Hip Replacement - Anterior Approach Disposition: Home, HHPT PCP: Dr. Yong Channel - cleared to proceed with surgery CV: Dr. Bridgett Larsson - pending clearance Topical TXA Anesthesia Issues: Nausea and Fainting Patient was instructed on what medications to stop prior to surgery. - Follow up visit in 2 weeks with Dr. Wynelle Link - Begin physical therapy following surgery - Pre-operative lab work as pre Pre-Surgical  Testing - Prescriptions will be provided in hospital at time of discharge  Return to [place], MD for [place] at [facility] on 03/01/2018 at 01:15 PM  Encounter signed-off by Mickel Crow, PA-C

## 2018-02-11 NOTE — H&P (View-Only) (Signed)
Elizabeth Douglas, Elizabeth Douglas [phone], F) DOB    1951-09-15              Chief Complaint Right Hip Pain H&P right THA on 02/16/2018 at [facility]  Patient's Pharmacies [id number]): [address] [place], [place] [address], Ph [phone], Fax [phone]   Vitals Ht:        5 ft 7 in  Wt:       148 lbs  BMI:     23.2 BP - 118/68 Pulse - 76   Allergies Reviewed Allergies NKDA   Medications Reviewed Medications Anti Gas 01/21/18   entered       Dominica Cumine Aspir-81 01/21/18   entered       Lorriane Shire Cumine Fiber (psyllium husk) 01/21/18   entered       Vanessa Cumine omeprazole 20 mg capsule,delayed release 09/19/17   filled           PRIME Premarin 0.625 mg/gram vaginal cream 11/16/17   filled           PRIME Vitamin D3 1,000 unit capsule Take by oral route. 01/21/18   entered       Lorriane Shire Cumine    Reviewed Problems History of Deep vein thrombosis Osteoarthritis   Family History Reviewed Family History Father  - Father deceased             - Familial prostate cancer             - Malignant tumor of kidney Mother - Chronic obstructive lung disease             - Mother deceased Unspecified Relation   - Cirrhosis of liver - Sibling             - Myocardial infarction - Sibling   Social History Reviewed Social History Smoking Status: Never smoker Non-smoker Chewing tobacco: none Hand Dominance: Right Work related injury?: N Advance directive: N Medical Power of Attorney: N Live alone or with others?: (Notes: Lives with spouse) Marital status: Married   Surgical History Reviewed Surgical History Operation on lung - 2007 Vascular Surgery - 11/29/2015 - Arterial Clot Right Leg   GYN History Most Recent Bone Density: 12/01/2011. Most Recent Mammogram: 10/30/2017.   Past Medical History Reviewed Past Medical History Blood Clots: Y GERD/Reflux: Y Joint Pain: Y Previous Cortisone Injection(s): Y Notes:  Arterial Blood Clot,  Vertigo,  Hemorrhoids,  Postmenopausal,  Osteopenia    HPI The patient comes in for an H&P for her right THA on 02/16/2018 by Dr. Wynelle Link at [facility]. The patient is a 67 year old female who was seen for a second opinion. The patient reports right hip (worse than left) problems including pain, swelling and stiffness symptoms that have been present for 7 months. The symptoms began in association with a change in activity. She started becoming a little more aggressive with her exercise program. The patient reports symptoms radiating to the: right groin, right thigh, left groin, left thigh and lower back. The patient describes the hip problem as aching. The patient feels as if their symptoms are does feel they are worsening. Current treatment includes application of ice, restricted activity and non-opioid analgesics (Tylenol, prn at bedtime). Previous workup for this problem has included hip x-rays. Previous treatment for this problem has included corticosteroid injection in October. She states that the injection really only helped that day. She has also had some chiropractic treatment for her low back. She has  pain with weightbearing, especially the first few steps after sitting for a while. She alos has pain when trying to rotate her hip. She states that her RIGHT hip is bothering her much more than her LEFT. Symptoms began constantly about 7 months ago and have gotten progressively worse since the onset. The pain is occurring at all times and is now limiting what she can and cannot do. She has lost a lot of motion in the RIGHT hip which is also having a very negative effect on her lifestyle. The previous intra-articular injection helped for a very short amount of time. She is at a stage now where she feels as though she needs to do something about the hip as it is essentially taking over her life. AP pelvis and lateral of both hips show significant arthritic change in  the RIGHT worse than LEFT hip. She has focal bone-on-bone changes in the superolateral hip on the RIGHT side and near bone-on-bone on the LEFT. She has a rapidly progressive arthritis of the RIGHT hip with significant pain and dysfunction.The patient has significant pain and dysfunction in their hip. They have significant functional limitations and have failed nonoperative management. At this point the most predictable means of improving pain and function is total hip arthroplasty. Risks and benefits have been discussed with the patient and she elects to proceed with surgery.   ROS Constitutional: Constitutional: no significant weight gain or loss and no fever.  HEENT: Eyes: no irritation, dry eyes, vision change, or sore throat.  Cardiovascular: Cardiovascular: no palpitations or chest pain.  Respiratory: Respiratory: no cough or shortness of breath and No COPD.  Gastrointestinal: Gastrointestinal: heartburn and indigestion, hemorrhoids.  Genitourinary: Genitourinary: no blood in urine or difficulty urinating.  Musculoskeletal: Musculoskeletal: Joint Pain; stiffness, decreased range of motion, swelling.  Hematologic/Lymphatic: Hematologic/Lymphatic Bleed Easily; history of arterial clot.   Physical Exam Patient is a 67 year old female. Accompanied by her husband General Mental Status - Alert, cooperative and good historian. General Appearance - pleasant, Not in acute distress. Orientation - Oriented X3. Build & Nutrition - Well nourished and Well developed.  Head and Neck Head - normocephalic, atraumatic . Neck Global Assessment - supple, no bruit auscultated on the right, no bruit auscultated on the left.  Eye Wears contacts Pupil - Bilateral - PERR Motion - Bilateral - EOMI.  Chest and Lung Exam Auscultation Breath sounds - clear at anterior chest wall and clear at posterior chest wall. Adventitious sounds - No Adventitious  sounds.  Cardiovascular Auscultation Rhythm - Regular rate and rhythm. Heart Sounds - S1 WNL and S2 WNL. Murmurs & Other Heart Sounds - Auscultation of the heart reveals - No Murmurs.  Abdomen Palpation/Percussion Tenderness - Abdomen is non-tender to palpation. Abdomen is soft. Auscultation Auscultation of the abdomen reveals - Bowel sounds normal.  Genitourinary Note: Not done, not pertinent to present illness  Musculoskeletal RIGHT hip can be flexed to 100 with minimal internal rotation, 10 of external rotation and 10 of abduction. She has no trochanteric tenderness. LEFT hip can be flexed to 110 with 10 of internal rotation, 20 of external rotation and 20 of abduction. There is no trochanteric tenderness. Right knee shows no effusion. Range of motion is 0-125. There is no crepitus on range of motion of the knee. There is no medial or lateral joint line tenderness. There is no instability noted. Left knee shows no effusion. Range of motion 0-125. No medial or lateral joint line tenderness. No instability. No crepitus on range of  motion. She has a significantly antalgic gait pattern on the RIGHT.  Radiographs-AP pelvis and lateral of both hips show significant arthritic change in the RIGHT worse than LEFT hip. She has focal bone-on-bone changes in the superolateral hip on the RIGHT side and near bone-on-bone on the LEFT.   Assessment / Plan 1. Osteoarthritis of hip M16.11: Unilateral primary osteoarthritis, right hip  Patient Instructions Surgical Plans: Right Total Hip Replacement - Anterior Approach Disposition: Home, HHPT PCP: Dr. Yong Channel - cleared to proceed with surgery CV: Dr. Bridgett Larsson - pending clearance Topical TXA Anesthesia Issues: Nausea and Fainting Patient was instructed on what medications to stop prior to surgery. - Follow up visit in 2 weeks with Dr. Wynelle Link - Begin physical therapy following surgery - Pre-operative lab work as pre Pre-Surgical  Testing - Prescriptions will be provided in hospital at time of discharge  Return to [place], MD for [place] at [facility] on 03/01/2018 at 01:15 PM  Encounter signed-off by Mickel Crow, PA-C

## 2018-02-15 MED ORDER — TRANEXAMIC ACID 1000 MG/10ML IV SOLN
2000.0000 mg | INTRAVENOUS | Status: DC
  Filled 2018-02-15: qty 20

## 2018-02-15 NOTE — Anesthesia Preprocedure Evaluation (Addendum)
Anesthesia Evaluation  Patient identified by MRN, date of birth, ID band Patient awake    Reviewed: Allergy & Precautions, NPO status , Patient's Chart, lab work & pertinent test results  History of Anesthesia Complications (+) PONV  Airway Mallampati: II  TM Distance: >3 FB Neck ROM: Full    Dental   Pulmonary neg pulmonary ROS,    breath sounds clear to auscultation       Cardiovascular + Peripheral Vascular Disease   Rhythm:Regular Rate:Normal     Neuro/Psych negative neurological ROS     GI/Hepatic Neg liver ROS, GERD  ,  Endo/Other  negative endocrine ROS  Renal/GU negative Renal ROS     Musculoskeletal  (+) Arthritis ,   Abdominal   Peds  Hematology negative hematology ROS (+)   Anesthesia Other Findings   Reproductive/Obstetrics                            Lab Results  Component Value Date   WBC 5.1 02/08/2018   HGB 14.2 02/08/2018   HCT 41.8 02/08/2018   MCV 95.4 02/08/2018   PLT 152 02/08/2018   Lab Results  Component Value Date   CREATININE 0.61 02/08/2018   BUN 15 02/08/2018   NA 141 02/08/2018   K 4.2 02/08/2018   CL 104 02/08/2018   CO2 29 02/08/2018    Anesthesia Physical Anesthesia Plan  ASA: II  Anesthesia Plan: Spinal and MAC   Post-op Pain Management:    Induction: Intravenous  PONV Risk Score and Plan: 3 and Propofol infusion, Dexamethasone, Ondansetron and Treatment may vary due to age or medical condition  Airway Management Planned: Natural Airway and Simple Face Mask  Additional Equipment:   Intra-op Plan:   Post-operative Plan:   Informed Consent: I have reviewed the patients History and Physical, chart, labs and discussed the procedure including the risks, benefits and alternatives for the proposed anesthesia with the patient or authorized representative who has indicated his/her understanding and acceptance.     Plan Discussed with:  CRNA  Anesthesia Plan Comments:        Anesthesia Quick Evaluation

## 2018-02-16 ENCOUNTER — Other Ambulatory Visit: Payer: Self-pay

## 2018-02-16 ENCOUNTER — Inpatient Hospital Stay (HOSPITAL_COMMUNITY): Payer: BLUE CROSS/BLUE SHIELD | Admitting: Certified Registered Nurse Anesthetist

## 2018-02-16 ENCOUNTER — Inpatient Hospital Stay (HOSPITAL_COMMUNITY): Payer: BLUE CROSS/BLUE SHIELD

## 2018-02-16 ENCOUNTER — Encounter (HOSPITAL_COMMUNITY): Payer: Self-pay | Admitting: Emergency Medicine

## 2018-02-16 ENCOUNTER — Encounter (HOSPITAL_COMMUNITY)
Admission: RE | Disposition: A | Discharge status: Home Health | Payer: Self-pay | Source: Ambulatory Visit | Attending: Orthopedic Surgery

## 2018-02-16 ENCOUNTER — Inpatient Hospital Stay (HOSPITAL_COMMUNITY)
Admission: RE | Admit: 2018-02-16 | Discharge: 2018-02-18 | DRG: 470 | Disposition: A | Discharge status: Home Health | Payer: BLUE CROSS/BLUE SHIELD | Source: Ambulatory Visit | Attending: Orthopedic Surgery | Admitting: Orthopedic Surgery

## 2018-02-16 DIAGNOSIS — K219 Gastro-esophageal reflux disease without esophagitis: Secondary | ICD-10-CM | POA: Diagnosis present

## 2018-02-16 DIAGNOSIS — M8580 Other specified disorders of bone density and structure, unspecified site: Secondary | ICD-10-CM | POA: Diagnosis present

## 2018-02-16 DIAGNOSIS — M169 Osteoarthritis of hip, unspecified: Secondary | ICD-10-CM

## 2018-02-16 DIAGNOSIS — M1611 Unilateral primary osteoarthritis, right hip: Secondary | ICD-10-CM | POA: Diagnosis present

## 2018-02-16 DIAGNOSIS — Z86718 Personal history of other venous thrombosis and embolism: Secondary | ICD-10-CM

## 2018-02-16 DIAGNOSIS — Z96649 Presence of unspecified artificial hip joint: Secondary | ICD-10-CM

## 2018-02-16 DIAGNOSIS — M25551 Pain in right hip: Secondary | ICD-10-CM | POA: Diagnosis present

## 2018-02-16 HISTORY — PX: TOTAL HIP ARTHROPLASTY: SHX124

## 2018-02-16 LAB — TYPE AND SCREEN
ABO/RH(D): O POS
ANTIBODY SCREEN: POSITIVE

## 2018-02-16 SURGERY — ARTHROPLASTY, HIP, TOTAL, ANTERIOR APPROACH
Anesthesia: Monitor Anesthesia Care | Site: Hip | Laterality: Right

## 2018-02-16 MED ORDER — PHENYLEPHRINE 40 MCG/ML (10ML) SYRINGE FOR IV PUSH (FOR BLOOD PRESSURE SUPPORT)
PREFILLED_SYRINGE | INTRAVENOUS | Status: DC | PRN
  Administered 2018-02-16 (×5): 80 ug via INTRAVENOUS

## 2018-02-16 MED ORDER — PHENOL 1.4 % MT LIQD: 1.0000 | OROMUCOSAL | Status: DC | PRN

## 2018-02-16 MED ORDER — BUPIVACAINE HCL (PF) 0.25 % IJ SOLN
INTRAMUSCULAR | Status: DC | PRN
  Administered 2018-02-16: 30 mL

## 2018-02-16 MED ORDER — RIVAROXABAN 10 MG PO TABS
10.0000 mg | ORAL_TABLET | Freq: Every day | ORAL | Status: DC
  Administered 2018-02-17 – 2018-02-18 (×2): 10 mg via ORAL
  Filled 2018-02-16 (×2): qty 1

## 2018-02-16 MED ORDER — DEXAMETHASONE SODIUM PHOSPHATE 10 MG/ML IJ SOLN
INTRAMUSCULAR | Status: AC
  Filled 2018-02-16: qty 1

## 2018-02-16 MED ORDER — TRAMADOL HCL 50 MG PO TABS
50.0000 mg | ORAL_TABLET | Freq: Four times a day (QID) | ORAL | Status: DC | PRN
  Administered 2018-02-16: 100 mg via ORAL
  Filled 2018-02-16: qty 2

## 2018-02-16 MED ORDER — PROPOFOL 500 MG/50ML IV EMUL
INTRAVENOUS | Status: DC | PRN
  Administered 2018-02-16: 25 ug/kg/min via INTRAVENOUS

## 2018-02-16 MED ORDER — FENTANYL CITRATE (PF) 100 MCG/2ML IJ SOLN
INTRAMUSCULAR | Status: AC
  Administered 2018-02-16: 25 ug via INTRAVENOUS
  Filled 2018-02-16: qty 2

## 2018-02-16 MED ORDER — STERILE WATER FOR IRRIGATION IR SOLN
Status: DC | PRN
  Administered 2018-02-16: 2000 mL

## 2018-02-16 MED ORDER — TRANEXAMIC ACID 1000 MG/10ML IV SOLN
1000.0000 mg | Freq: Once | INTRAVENOUS | Status: AC
  Administered 2018-02-16: 1000 mg via INTRAVENOUS
  Filled 2018-02-16: qty 1100

## 2018-02-16 MED ORDER — SODIUM CHLORIDE 0.9 % IV SOLN
INTRAVENOUS | Status: DC
  Administered 2018-02-16 (×2): via INTRAVENOUS

## 2018-02-16 MED ORDER — METOCLOPRAMIDE HCL 5 MG PO TABS: 5.0000 mg | ORAL_TABLET | Freq: Three times a day (TID) | ORAL | Status: DC | PRN

## 2018-02-16 MED ORDER — FENTANYL CITRATE (PF) 100 MCG/2ML IJ SOLN
25.0000 ug | INTRAMUSCULAR | Status: DC | PRN
  Administered 2018-02-16 (×2): 25 ug via INTRAVENOUS

## 2018-02-16 MED ORDER — ONDANSETRON HCL 4 MG PO TABS: 4.0000 mg | ORAL_TABLET | Freq: Four times a day (QID) | ORAL | Status: DC | PRN

## 2018-02-16 MED ORDER — OXYCODONE HCL 5 MG PO TABS: 10.0000 mg | ORAL_TABLET | ORAL | Status: DC | PRN

## 2018-02-16 MED ORDER — ONDANSETRON HCL 4 MG/2ML IJ SOLN
4.0000 mg | Freq: Four times a day (QID) | INTRAMUSCULAR | Status: DC | PRN
  Administered 2018-02-16 – 2018-02-17 (×3): 4 mg via INTRAVENOUS
  Filled 2018-02-16 (×3): qty 2

## 2018-02-16 MED ORDER — PROPOFOL 10 MG/ML IV BOLUS
INTRAVENOUS | Status: AC
  Filled 2018-02-16: qty 40

## 2018-02-16 MED ORDER — CEFAZOLIN SODIUM-DEXTROSE 1-4 GM/50ML-% IV SOLN
1.0000 g | Freq: Four times a day (QID) | INTRAVENOUS | Status: AC
  Administered 2018-02-16 (×2): 1 g via INTRAVENOUS
  Filled 2018-02-16 (×2): qty 50

## 2018-02-16 MED ORDER — PHENYLEPHRINE HCL 10 MG/ML IJ SOLN
INTRAVENOUS | Status: DC | PRN
  Administered 2018-02-16: 50 ug/min via INTRAVENOUS

## 2018-02-16 MED ORDER — ACETAMINOPHEN 500 MG PO TABS
1000.0000 mg | ORAL_TABLET | Freq: Four times a day (QID) | ORAL | Status: AC
  Administered 2018-02-16 – 2018-02-17 (×4): 1000 mg via ORAL
  Filled 2018-02-16 (×4): qty 2

## 2018-02-16 MED ORDER — POLYETHYLENE GLYCOL 3350 17 G PO PACK
17.0000 g | PACK | Freq: Every day | ORAL | Status: DC | PRN
  Administered 2018-02-18: 17 g via ORAL
  Filled 2018-02-16: qty 1

## 2018-02-16 MED ORDER — DIPHENHYDRAMINE HCL 12.5 MG/5ML PO ELIX: 12.5000 mg | ORAL_SOLUTION | ORAL | Status: DC | PRN

## 2018-02-16 MED ORDER — 0.9 % SODIUM CHLORIDE (POUR BTL) OPTIME
TOPICAL | Status: DC | PRN
  Administered 2018-02-16: 1000 mL

## 2018-02-16 MED ORDER — EPHEDRINE SULFATE 50 MG/ML IJ SOLN
INTRAMUSCULAR | Status: DC | PRN
  Administered 2018-02-16: 5 mg via INTRAVENOUS

## 2018-02-16 MED ORDER — MIDAZOLAM HCL 5 MG/5ML IJ SOLN
INTRAMUSCULAR | Status: DC | PRN
  Administered 2018-02-16 (×2): 1 mg via INTRAVENOUS

## 2018-02-16 MED ORDER — MIDAZOLAM HCL 2 MG/2ML IJ SOLN
INTRAMUSCULAR | Status: AC
  Filled 2018-02-16: qty 2

## 2018-02-16 MED ORDER — ACETAMINOPHEN 325 MG PO TABS
325.0000 mg | ORAL_TABLET | Freq: Four times a day (QID) | ORAL | Status: DC | PRN
  Administered 2018-02-17 – 2018-02-18 (×3): 650 mg via ORAL
  Filled 2018-02-16 (×3): qty 2

## 2018-02-16 MED ORDER — DEXTROSE 5 % IV SOLN
500.0000 mg | Freq: Four times a day (QID) | INTRAVENOUS | Status: DC | PRN
  Filled 2018-02-16: qty 5

## 2018-02-16 MED ORDER — LACTATED RINGERS IV SOLN
INTRAVENOUS | Status: DC
  Administered 2018-02-16 (×2): via INTRAVENOUS

## 2018-02-16 MED ORDER — TRANEXAMIC ACID 1000 MG/10ML IV SOLN
INTRAVENOUS | Status: AC | PRN
  Administered 2018-02-16: 2000 mg via TOPICAL

## 2018-02-16 MED ORDER — BUPIVACAINE HCL (PF) 0.25 % IJ SOLN
INTRAMUSCULAR | Status: AC
  Filled 2018-02-16: qty 30

## 2018-02-16 MED ORDER — METOCLOPRAMIDE HCL 5 MG/ML IJ SOLN
5.0000 mg | Freq: Three times a day (TID) | INTRAMUSCULAR | Status: DC | PRN
  Administered 2018-02-16: 10 mg via INTRAVENOUS
  Administered 2018-02-17: 5 mg via INTRAVENOUS
  Filled 2018-02-16 (×2): qty 2

## 2018-02-16 MED ORDER — DEXAMETHASONE SODIUM PHOSPHATE 10 MG/ML IJ SOLN
10.0000 mg | Freq: Once | INTRAMUSCULAR | Status: AC
  Administered 2018-02-16: 10 mg via INTRAVENOUS

## 2018-02-16 MED ORDER — DEXAMETHASONE SODIUM PHOSPHATE 10 MG/ML IJ SOLN
10.0000 mg | Freq: Once | INTRAMUSCULAR | Status: AC
  Administered 2018-02-17: 10 mg via INTRAVENOUS
  Filled 2018-02-16: qty 1

## 2018-02-16 MED ORDER — CHLORHEXIDINE GLUCONATE 4 % EX LIQD: 60.0000 mL | Freq: Once | CUTANEOUS | Status: DC

## 2018-02-16 MED ORDER — ONDANSETRON HCL 4 MG/2ML IJ SOLN
INTRAMUSCULAR | Status: DC | PRN
  Administered 2018-02-16: 4 mg via INTRAVENOUS

## 2018-02-16 MED ORDER — METHOCARBAMOL 500 MG PO TABS
500.0000 mg | ORAL_TABLET | Freq: Four times a day (QID) | ORAL | Status: DC | PRN
  Administered 2018-02-17: 500 mg via ORAL
  Filled 2018-02-16: qty 1

## 2018-02-16 MED ORDER — DOCUSATE SODIUM 100 MG PO CAPS
100.0000 mg | ORAL_CAPSULE | Freq: Two times a day (BID) | ORAL | Status: DC
  Administered 2018-02-16 – 2018-02-18 (×4): 100 mg via ORAL
  Filled 2018-02-16 (×4): qty 1

## 2018-02-16 MED ORDER — ONDANSETRON HCL 4 MG/2ML IJ SOLN
INTRAMUSCULAR | Status: AC
  Filled 2018-02-16: qty 2

## 2018-02-16 MED ORDER — BISACODYL 10 MG RE SUPP
10.0000 mg | Freq: Every day | RECTAL | Status: DC | PRN
Start: 2018-02-16 — End: 2018-02-18

## 2018-02-16 MED ORDER — CEFAZOLIN SODIUM-DEXTROSE 2-4 GM/100ML-% IV SOLN
2.0000 g | INTRAVENOUS | Status: AC
  Administered 2018-02-16: 2 g via INTRAVENOUS
  Filled 2018-02-16: qty 100

## 2018-02-16 MED ORDER — OXYCODONE HCL 5 MG PO TABS
5.0000 mg | ORAL_TABLET | ORAL | Status: DC | PRN
  Administered 2018-02-16 (×2): 10 mg via ORAL
  Administered 2018-02-16 – 2018-02-17 (×3): 5 mg via ORAL
  Filled 2018-02-16: qty 1
  Filled 2018-02-16 (×2): qty 2
  Filled 2018-02-16: qty 1
  Filled 2018-02-16: qty 2

## 2018-02-16 MED ORDER — MENTHOL 3 MG MT LOZG
1.0000 | LOZENGE | OROMUCOSAL | Status: DC | PRN
  Administered 2018-02-17: 3 mg via ORAL
  Filled 2018-02-16: qty 9

## 2018-02-16 MED ORDER — ACETAMINOPHEN 10 MG/ML IV SOLN
1000.0000 mg | Freq: Once | INTRAVENOUS | Status: AC
  Administered 2018-02-16: 1000 mg via INTRAVENOUS
  Filled 2018-02-16: qty 100

## 2018-02-16 MED ORDER — FLEET ENEMA 7-19 GM/118ML RE ENEM: 1.0000 | ENEMA | Freq: Once | RECTAL | Status: DC | PRN

## 2018-02-16 MED ORDER — SODIUM CHLORIDE 0.9 % IV BOLUS (SEPSIS)
500.0000 mL | Freq: Once | INTRAVENOUS | Status: AC
  Administered 2018-02-16: 500 mL via INTRAVENOUS

## 2018-02-16 MED ORDER — PANTOPRAZOLE SODIUM 40 MG PO TBEC
40.0000 mg | DELAYED_RELEASE_TABLET | Freq: Every day | ORAL | Status: DC
Start: 2018-02-17 — End: 2018-02-17
  Administered 2018-02-17: 40 mg via ORAL
  Filled 2018-02-16: qty 1

## 2018-02-16 MED ORDER — FENTANYL CITRATE (PF) 100 MCG/2ML IJ SOLN
INTRAMUSCULAR | Status: AC
  Filled 2018-02-16: qty 2

## 2018-02-16 MED ORDER — HYDROMORPHONE HCL 1 MG/ML IJ SOLN: 0.5000 mg | INTRAMUSCULAR | Status: DC | PRN

## 2018-02-16 MED ORDER — BUPIVACAINE IN DEXTROSE 0.75-8.25 % IT SOLN
INTRATHECAL | Status: DC | PRN
  Administered 2018-02-16: 2 mL via INTRATHECAL

## 2018-02-16 SURGICAL SUPPLY — 40 items
BAG DECANTER FOR FLEXI CONT (MISCELLANEOUS) ×2 IMPLANT
BAG SPEC THK2 15X12 ZIP CLS (MISCELLANEOUS) ×1
BAG ZIPLOCK 12X15 (MISCELLANEOUS) ×1 IMPLANT
BLADE SAG 18X100X1.27 (BLADE) ×2 IMPLANT
CAPT HIP TOTAL 2 ×1 IMPLANT
CLOTH BEACON ORANGE TIMEOUT ST (SAFETY) ×2 IMPLANT
COVER PERINEAL POST (MISCELLANEOUS) ×2 IMPLANT
COVER SURGICAL LIGHT HANDLE (MISCELLANEOUS) ×2 IMPLANT
DECANTER SPIKE VIAL GLASS SM (MISCELLANEOUS) ×2 IMPLANT
DRAPE STERI IOBAN 125X83 (DRAPES) ×2 IMPLANT
DRAPE U-SHAPE 47X51 STRL (DRAPES) ×4 IMPLANT
DRSG ADAPTIC 3X8 NADH LF (GAUZE/BANDAGES/DRESSINGS) ×2 IMPLANT
DRSG MEPILEX BORDER 4X4 (GAUZE/BANDAGES/DRESSINGS) ×2 IMPLANT
DRSG MEPILEX BORDER 4X8 (GAUZE/BANDAGES/DRESSINGS) ×2 IMPLANT
DURAPREP 26ML APPLICATOR (WOUND CARE) ×2 IMPLANT
ELECT REM PT RETURN 15FT ADLT (MISCELLANEOUS) ×2 IMPLANT
EVACUATOR 1/8 PVC DRAIN (DRAIN) ×2 IMPLANT
GLOVE BIO SURGEON STRL SZ 6.5 (GLOVE) ×1 IMPLANT
GLOVE BIO SURGEON STRL SZ7.5 (GLOVE) ×2 IMPLANT
GLOVE BIO SURGEON STRL SZ8 (GLOVE) ×4 IMPLANT
GLOVE BIOGEL PI IND STRL 7.0 (GLOVE) IMPLANT
GLOVE BIOGEL PI IND STRL 7.5 (GLOVE) IMPLANT
GLOVE BIOGEL PI IND STRL 8 (GLOVE) ×2 IMPLANT
GLOVE BIOGEL PI INDICATOR 7.0 (GLOVE) ×3
GLOVE BIOGEL PI INDICATOR 7.5 (GLOVE) ×4
GLOVE BIOGEL PI INDICATOR 8 (GLOVE) ×2
GLOVE ECLIPSE 7.5 STRL STRAW (GLOVE) ×1 IMPLANT
GOWN STRL REUS W/TWL LRG LVL3 (GOWN DISPOSABLE) ×4 IMPLANT
GOWN STRL REUS W/TWL XL LVL3 (GOWN DISPOSABLE) ×3 IMPLANT
PACK ANTERIOR HIP CUSTOM (KITS) ×2 IMPLANT
STRIP CLOSURE SKIN 1/2X4 (GAUZE/BANDAGES/DRESSINGS) ×2 IMPLANT
SUT ETHIBOND NAB CT1 #1 30IN (SUTURE) ×2 IMPLANT
SUT MNCRL AB 4-0 PS2 18 (SUTURE) ×2 IMPLANT
SUT STRATAFIX 0 PDS 27 VIOLET (SUTURE) ×2
SUT VIC AB 2-0 CT1 27 (SUTURE) ×4
SUT VIC AB 2-0 CT1 TAPERPNT 27 (SUTURE) ×2 IMPLANT
SUTURE STRATFX 0 PDS 27 VIOLET (SUTURE) ×1 IMPLANT
SYR 50ML LL SCALE MARK (SYRINGE) ×1 IMPLANT
TRAY FOLEY CATH SILVER 14FR (SET/KITS/TRAYS/PACK) ×1 IMPLANT
YANKAUER SUCT BULB TIP 10FT TU (MISCELLANEOUS) ×2 IMPLANT

## 2018-02-16 NOTE — Discharge Instructions (Addendum)
Dr. Gaynelle Arabian Total Joint Specialist [facility] [address], [place], [place] [address] [phone]  ANTERIOR APPROACH TOTAL HIP REPLACEMENT POSTOPERATIVE DIRECTIONS   Hip Rehabilitation, Guidelines Following Surgery  The results of a hip operation are greatly improved after range of motion and muscle strengthening exercises. Follow all safety measures which are given to protect your hip. If any of these exercises cause increased pain or swelling in your joint, decrease the amount until you are comfortable again. Then slowly increase the exercises. Call your caregiver if you have problems or questions.   HOME CARE INSTRUCTIONS  Remove items at home which could result in a fall. This includes throw rugs or furniture in walking pathways.   ICE to the affected hip every three hours for 30 minutes at a time and then as needed for pain and swelling.  Continue to use ice on the hip for pain and swelling from surgery. You may notice swelling that will progress down to the foot and ankle.  This is normal after surgery.  Elevate the leg when you are not up walking on it.    Continue to use the breathing machine which will help keep your temperature down.  It is common for your temperature to cycle up and down following surgery, especially at night when you are not up moving around and exerting yourself.  The breathing machine keeps your lungs expanded and your temperature down.   DIET You may resume your previous home diet once your are discharged from the hospital.  DRESSING / WOUND CARE / SHOWERING You may shower 3 days after surgery, but keep the wounds dry during showering.  You may use an occlusive plastic wrap (Press'n Seal for example), NO SOAKING/SUBMERGING IN THE BATHTUB.  If the bandage gets wet, change with a clean dry gauze.  If the incision gets wet, pat the wound dry with a clean towel. You may start showering once you are discharged home but do not submerge  the incision under water. Just pat the incision dry and apply a dry gauze dressing on daily. Change the surgical dressing daily and reapply a dry dressing each time.  ACTIVITY Walk with your walker as instructed. Use walker as long as suggested by your caregivers. Avoid periods of inactivity such as sitting longer than an hour when not asleep. This helps prevent blood clots.  You may resume a sexual relationship in one month or when given the OK by your doctor.  You may return to work once you are cleared by your doctor.  Do not drive a car for 6 weeks or until released by you surgeon.  Do not drive while taking narcotics.  WEIGHT BEARING Weight bearing as tolerated with assist device (walker, cane, etc) as directed, use it as long as suggested by your surgeon or therapist, typically at least 4-6 weeks.  POSTOPERATIVE CONSTIPATION PROTOCOL Constipation - defined medically as fewer than three stools per week and severe constipation as less than one stool per week.  One of the most common issues patients have following surgery is constipation.  Even if you have a regular bowel pattern at home, your normal regimen is likely to be disrupted due to multiple reasons following surgery.  Combination of anesthesia, postoperative narcotics, change in appetite and fluid intake all can affect your bowels.  In order to avoid complications following surgery, here are some recommendations in order to help you during your recovery period.  Colace (docusate) - Pick up an over-the-counter  form of Colace or another stool softener and take twice a day as long as you are requiring postoperative pain medications.  Take with a full glass of water daily.  If you experience loose stools or diarrhea, hold the colace until you stool forms back up.  If your symptoms do not get better within 1 week or if they get worse, check with your doctor.  Dulcolax (bisacodyl) - Pick up over-the-counter and take as directed by the  product packaging as needed to assist with the movement of your bowels.  Take with a full glass of water.  Use this product as needed if not relieved by Colace only.   MiraLax (polyethylene glycol) - Pick up over-the-counter to have on hand.  MiraLax is a solution that will increase the amount of water in your bowels to assist with bowel movements.  Take as directed and can mix with a glass of water, juice, soda, coffee, or tea.  Take if you go more than two days without a movement. Do not use MiraLax more than once per day. Call your doctor if you are still constipated or irregular after using this medication for 7 days in a row.  If you continue to have problems with postoperative constipation, please contact the office for further assistance and recommendations.  If you experience "the worst abdominal pain ever" or develop nausea or vomiting, please contact the office immediatly for further recommendations for treatment.  ITCHING  If you experience itching with your medications, try taking only a single pain pill, or even half a pain pill at a time.  You can also use Benadryl over the counter for itching or also to help with sleep.   TED HOSE STOCKINGS Wear the elastic stockings on both legs for three weeks following surgery during the day but you may remove then at night for sleeping.  MEDICATIONS See your medication summary on the After Visit Summary that the nursing staff will review with you prior to discharge.  You may have some home medications which will be placed on hold until you complete the course of blood thinner medication.  It is important for you to complete the blood thinner medication as prescribed by your surgeon.  Continue your approved medications as instructed at time of discharge.  PRECAUTIONS If you experience chest pain or shortness of breath - call 911 immediately for transfer to the hospital emergency department.  If you develop a fever greater that 101 F, purulent  drainage from wound, increased redness or drainage from wound, foul odor from the wound/dressing, or calf pain - CONTACT YOUR SURGEON.                                                   FOLLOW-UP APPOINTMENTS Make sure you keep all of your appointments after your operation with your surgeon and caregivers. You should call the office at the above phone number and make an appointment for approximately two weeks after the date of your surgery or on the date instructed by your surgeon outlined in the "After Visit Summary".  RANGE OF MOTION AND STRENGTHENING EXERCISES  These exercises are designed to help you keep full movement of your hip joint. Follow your caregiver's or physical therapist's instructions. Perform all exercises about fifteen times, three times per day or as directed. Exercise both hips, even if you  have had only one joint replacement. These exercises can be done on a training (exercise) mat, on the floor, on a table or on a bed. Use whatever works the best and is most comfortable for you. Use music or television while you are exercising so that the exercises are a pleasant break in your day. This will make your life better with the exercises acting as a break in routine you can look forward to.  Lying on your back, slowly slide your foot toward your buttocks, raising your knee up off the floor. Then slowly slide your foot back down until your leg is straight again.  Lying on your back spread your legs as far apart as you can without causing discomfort.  Lying on your side, raise your upper leg and foot straight up from the floor as far as is comfortable. Slowly lower the leg and repeat.  Lying on your back, tighten up the muscle in the front of your thigh (quadriceps muscles). You can do this by keeping your leg straight and trying to raise your heel off the floor. This helps strengthen the largest muscle supporting your knee.  Lying on your back, tighten up the muscles of your buttocks both  with the legs straight and with the knee bent at a comfortable angle while keeping your heel on the floor.   IF YOU ARE TRANSFERRED TO A SKILLED REHAB FACILITY If the patient is transferred to a skilled rehab facility following release from the hospital, a list of the current medications will be sent to the facility for the patient to continue.  When discharged from the skilled rehab facility, please have the facility set up the patient's [place] prior to being released. Also, the skilled facility will be responsible for providing the patient with their medications at time of release from the facility to include their pain medication, the muscle relaxants, and their blood thinner medication. If the patient is still at the rehab facility at time of the two week follow up appointment, the skilled rehab facility will also need to assist the patient in arranging follow up appointment in our office and any transportation needs.  MAKE SURE YOU:  Understand these instructions.  Get help right away if you are not doing well or get worse.    Pick up stool softner and laxative for home use following surgery while on pain medications. Do not submerge incision under water. Please use good hand washing techniques while changing dressing each day. May shower starting three days after surgery. Please use a clean towel to pat the incision dry following showers. Continue to use ice for pain and swelling after surgery. Do not use any lotions or creams on the incision until instructed by your surgeon.  Take Xarelto for two and a half more weeks following discharge from the hospital, then discontinue Xarelto. Once the patient has completed the blood thinner regimen, then take a Baby 81 mg Aspirin daily for three more weeks.    Information on my medicine - XARELTO (Rivaroxaban)  Why was Xarelto prescribed for you? Xarelto was prescribed for you to reduce the risk of blood clots forming  after orthopedic surgery. The medical term for these abnormal blood clots is venous thromboembolism (VTE).  What do you need to know about xarelto ? Take your Xarelto ONCE DAILY at the same time every day. You may take it either with or without food.  If you have difficulty swallowing the tablet whole, you may crush  crush it and mix in applesauce just prior to taking your dose. ° °Take Xarelto® exactly as prescribed by your doctor and DO NOT stop taking Xarelto® without talking to the doctor who prescribed the medication.  Stopping without other VTE prevention medication to take the place of Xarelto® may increase your risk of developing a clot. ° °After discharge, you should have regular check-up appointments with your healthcare provider that is prescribing your Xarelto®.   ° °What do you do if you miss a dose? °If you miss a dose, take it as soon as you remember on the same day then continue your regularly scheduled once daily regimen the next day. Do not take two doses of Xarelto® on the same day.  ° °Important Safety Information °A possible side effect of Xarelto® is bleeding. You should call your healthcare provider right away if you experience any of the following: °? Bleeding from an injury or your nose that does not stop. °? Unusual colored urine (red or dark brown) or unusual colored stools (red or black). °? Unusual bruising for unknown reasons. °? A serious fall or if you hit your head (even if there is no bleeding). ° °Some medicines may interact with Xarelto® and might increase your risk of bleeding while on Xarelto®. To help avoid this, consult your healthcare provider or pharmacist prior to using any new prescription or non-prescription medications, including herbals, vitamins, non-steroidal anti-inflammatory drugs (NSAIDs) and supplements. ° °This website has more information on Xarelto®: www.xarelto.com. ° ° ° °

## 2018-02-16 NOTE — Transfer of Care (Signed)
Immediate Anesthesia Transfer of Care Note  Patient: Elizabeth Douglas  Procedure(s) Performed: RIGHT TOTAL HIP ARTHROPLASTY ANTERIOR APPROACH (Right Hip)  Patient Location: PACU  Anesthesia Type:MAC and Spinal  Level of Consciousness: awake, alert  and patient cooperative  Airway & Oxygen Therapy: Patient Spontanous Breathing and Patient connected to face mask oxygen  Post-op Assessment: Report given to RN and Post -op Vital signs reviewed and stable  Post vital signs: Reviewed and stable  Last Vitals:  Vitals:   02/16/18 0643  BP: 112/82  Pulse: 71  Resp: 18  Temp: 36.9 C  SpO2: 98%    Last Pain:  Vitals:   02/16/18 0643  TempSrc: Oral      Patients Stated Pain Goal: 4 (74/08/14 4818)  Complications: No apparent anesthesia complications

## 2018-02-16 NOTE — Interval H&P Note (Signed)
History and Physical Interval Note:  02/16/2018 8:12 AM  Elizabeth Douglas  has presented today for surgery, with the diagnosis of Osteoarthritis Right Hip  The various methods of treatment have been discussed with the patient and family. After consideration of risks, benefits and other options for treatment, the patient has consented to  Procedure(s): RIGHT TOTAL HIP ARTHROPLASTY ANTERIOR APPROACH (Right) as a surgical intervention .  The patient's history has been reviewed, patient examined, no change in status, stable for surgery.  I have reviewed the patient's chart and labs.  Questions were answered to the patient's satisfaction.     Pilar Plate Cayenne Breault

## 2018-02-16 NOTE — Anesthesia Procedure Notes (Addendum)
Spinal  Patient location during procedure: OR Start time: 02/16/2018 8:25 AM End time: 02/16/2018 8:30 AM Staffing Anesthesiologist: Suzette Battiest, MD Performed: anesthesiologist  Preanesthetic Checklist Completed: patient identified, site marked, surgical consent, pre-op evaluation, timeout performed, IV checked, risks and benefits discussed and monitors and equipment checked Spinal Block Patient position: sitting Prep: site prepped and draped and DuraPrep Patient monitoring: blood pressure, continuous pulse ox and heart rate Approach: midline Location: L4-5 Injection technique: single-shot Needle Needle type: Pencan  Needle gauge: 24 G Needle length: 9 cm Needle insertion depth: 6 cm

## 2018-02-16 NOTE — Anesthesia Postprocedure Evaluation (Signed)
Anesthesia Post Note  Patient: Elizabeth Douglas  Procedure(s) Performed: RIGHT TOTAL HIP ARTHROPLASTY ANTERIOR APPROACH (Right Hip)     Patient location during evaluation: PACU Anesthesia Type: Spinal Level of consciousness: awake and alert Pain management: pain level controlled Vital Signs Assessment: post-procedure vital signs reviewed and stable Respiratory status: spontaneous breathing and respiratory function stable Cardiovascular status: blood pressure returned to baseline and stable Postop Assessment: spinal receding Anesthetic complications: no    Last Vitals:  Vitals:   02/16/18 1140 02/16/18 1240  BP: 103/69 (!) 93/52  Pulse: (!) 58 (!) 50  Resp: 16 16  Temp: (!) 36.3 C (!) 36.4 C  SpO2: 100% 100%    Last Pain:  Vitals:   02/16/18 1240  TempSrc: Oral  PainSc:                  [name]

## 2018-02-16 NOTE — Plan of Care (Signed)
Plan of care discussed.   

## 2018-02-16 NOTE — Progress Notes (Signed)
PT Cancellation Note  Patient Details Name: Elizabeth Douglas MRN: [id number] DOB: 09-13-1951   Cancelled Treatment:    Reason Eval/Treat Not Completed: Medical issues which prohibited therapy(per RN, pt having nausea and low BP. Will follow. )   Philomena Doheny 02/16/2018, 2:06 PM [phone]

## 2018-02-16 NOTE — Anesthesia Procedure Notes (Signed)
Procedure Name: MAC Date/Time: 02/16/2018 8:30 AM Performed by: West Pugh, CRNA Pre-anesthesia Checklist: Patient identified, Emergency Drugs available, Suction available, Patient being monitored and Timeout performed Patient Re-evaluated:Patient Re-evaluated prior to induction Oxygen Delivery Method: Simple face mask Placement Confirmation: positive ETCO2 Dental Injury: Teeth and Oropharynx as per pre-operative assessment

## 2018-02-16 NOTE — Op Note (Signed)
OPERATIVE REPORT- TOTAL HIP ARTHROPLASTY   PREOPERATIVE DIAGNOSIS: Osteoarthritis of the Right hip.   POSTOPERATIVE DIAGNOSIS: Osteoarthritis of the Right  hip.   PROCEDURE: Right total hip arthroplasty, anterior approach.   SURGEON: Gaynelle Arabian, MD   ASSISTANT: Arlee Muslim, PA-C  ANESTHESIA:  Spinal  ESTIMATED BLOOD LOSS:-400 mL    DRAINS: Hemovac x1.   COMPLICATIONS: None   CONDITION: PACU - hemodynamically stable.   BRIEF CLINICAL NOTE: Elizabeth Douglas is a 67 y.o. female who has advanced end-  stage arthritis of their Right  hip with progressively worsening pain and  dysfunction.The patient has failed nonoperative management and presents for  total hip arthroplasty.   PROCEDURE IN DETAIL: After successful administration of spinal  anesthetic, the traction boots for the [facility] bed were placed on both  feet and the patient was placed onto the [facility] bed, boots placed into the leg  holders. The Right hip was then isolated from the perineum with plastic  drapes and prepped and draped in the usual sterile fashion. ASIS and  greater trochanter were marked and a oblique incision was made, starting  at about 1 cm lateral and 2 cm distal to the ASIS and coursing towards  the anterior cortex of the femur. The skin was cut with a 10 blade  through subcutaneous tissue to the level of the fascia overlying the  tensor fascia lata muscle. The fascia was then incised in line with the  incision at the junction of the anterior third and posterior 2/3rd. The  muscle was teased off the fascia and then the interval between the TFL  and the rectus was developed. The Hohmann retractor was then placed at  the top of the femoral neck over the capsule. The vessels overlying the  capsule were cauterized and the fat on top of the capsule was removed.  A Hohmann retractor was then placed anterior underneath the rectus  femoris to give exposure to the entire anterior capsule. A T-shaped   capsulotomy was performed. The edges were tagged and the femoral head  was identified.       Osteophytes are removed off the superior acetabulum.  The femoral neck was then cut in situ with an oscillating saw. Traction  was then applied to the left lower extremity utilizing the [facility]  traction. The femoral head was then removed. Retractors were placed  around the acetabulum and then circumferential removal of the labrum was  performed. Osteophytes were also removed. Reaming starts at 45 mm to  medialize and  Increased in 2 mm increments to 49 mm. We reamed in  approximately 40 degrees of abduction, 20 degrees anteversion. A 50 mm  pinnacle acetabular shell was then impacted in anatomic position under  fluoroscopic guidance with excellent purchase. We did not need to place  any additional dome screws. A 32 mm neutral + 4 marathon liner was then  placed into the acetabular shell.       The femoral lift was then placed along the lateral aspect of the femur  just distal to the vastus ridge. The leg was  externally rotated and capsule  was stripped off the inferior aspect of the femoral neck down to the  level of the lesser trochanter, this was done with electrocautery. The femur was lifted after this was performed. The  leg was then placed in an extended and adducted position essentially delivering the femur. We also removed the capsule superiorly and the piriformis from the piriformis  fossa to gain excellent exposure of the  proximal femur. Rongeur was used to remove some cancellous bone to get  into the lateral portion of the proximal femur for placement of the  initial starter reamer. The starter broaches was placed  the starter broach  and was shown to go down the center of the canal. Broaching  with the  Corail system was then performed starting at size 8, coursing  Up to size 12. A size 12 had excellent torsional and rotational  and axial stability. The trial standard offset neck was then  placed  with a 32 + 1 trial head. The hip was then reduced. We confirmed that  the stem was in the canal both on AP and lateral x-rays. It also has excellent sizing. The hip was reduced with outstanding stability through full extension and full external rotation.. AP pelvis was taken and the leg lengths were measured and found to be equal. Hip was then dislocated again and the femoral head and neck removed. The  femoral broach was removed. Size 12 Corail stem with a standard offset  neck was then impacted into the femur following native anteversion. Has  excellent purchase in the canal. Excellent torsional and rotational and  axial stability. It is confirmed to be in the canal on AP and lateral  fluoroscopic views. The 32 + 1 ceramic head was placed and the hip  reduced with outstanding stability. Again AP pelvis was taken and it  confirmed that the leg lengths were equal. The wound was then copiously  irrigated with saline solution and the capsule reattached and repaired  with Ethibond suture. 30 ml of .25% Bupivicaine was  injected into the capsule and into the edge of the tensor fascia lata as well as subcutaneous tissue. The fascia overlying the tensor fascia lata was then closed with a running #1 V-Loc. Subcu was closed with interrupted 2-0 Vicryl and subcuticular running 4-0 Monocryl. Incision was cleaned  and dried. Steri-Strips and a bulky sterile dressing applied. Hemovac  drain was hooked to suction and then the patient was awakened and transported to  recovery in stable condition.        Please note that a surgical assistant was a medical necessity for this procedure to perform it in a safe and expeditious manner. Assistant was necessary to provide appropriate retraction of vital neurovascular structures and to prevent femoral fracture and allow for anatomic placement of the prosthesis.  Gaynelle Arabian, M.D.

## 2018-02-17 LAB — CBC
HCT: 32.2 % — ABNORMAL LOW (ref 36.0–46.0)
HEMOGLOBIN: 10.7 g/dL — AB (ref 12.0–15.0)
MCH: 31.9 pg (ref 26.0–34.0)
MCHC: 33.2 g/dL (ref 30.0–36.0)
MCV: 96.1 fL (ref 78.0–100.0)
Platelets: 142 10*3/uL — ABNORMAL LOW (ref 150–400)
RBC: 3.35 MIL/uL — ABNORMAL LOW (ref 3.87–5.11)
RDW: 12.7 % (ref 11.5–15.5)
WBC: 8.8 10*3/uL (ref 4.0–10.5)

## 2018-02-17 LAB — BASIC METABOLIC PANEL
Anion gap: 7 (ref 5–15)
BUN: 14 mg/dL (ref 6–20)
CHLORIDE: 106 mmol/L (ref 101–111)
CO2: 27 mmol/L (ref 22–32)
Calcium: 8.2 mg/dL — ABNORMAL LOW (ref 8.9–10.3)
Creatinine, Ser: 0.63 mg/dL (ref 0.44–1.00)
GFR calc Af Amer: >60 mL/min (ref >60)
GFR calc non Af Amer: >60 mL/min (ref >60)
GLUCOSE: 117 mg/dL — AB (ref 65–99)
Potassium: 4.5 mmol/L (ref 3.5–5.1)
SODIUM: 140 mmol/L (ref 135–145)

## 2018-02-17 MED ORDER — OXYCODONE HCL 5 MG PO TABS: 5.0000 mg | ORAL_TABLET | ORAL | 0 refills | Status: DC | PRN

## 2018-02-17 MED ORDER — OMEPRAZOLE 20 MG PO CPDR
20.0000 mg | DELAYED_RELEASE_CAPSULE | Freq: Two times a day (BID) | ORAL | Status: DC
  Administered 2018-02-17 – 2018-02-18 (×2): 20 mg via ORAL
  Filled 2018-02-17 (×2): qty 1

## 2018-02-17 MED ORDER — RIVAROXABAN 10 MG PO TABS: 10.0000 mg | ORAL_TABLET | Freq: Every day | ORAL | 0 refills | Status: DC

## 2018-02-17 MED ORDER — SODIUM CHLORIDE 0.9 % IV BOLUS (SEPSIS)
500.0000 mL | Freq: Once | INTRAVENOUS | Status: AC
  Administered 2018-02-17: 500 mL via INTRAVENOUS

## 2018-02-17 MED ORDER — NON FORMULARY: 20.0000 mg | Freq: Two times a day (BID) | Status: DC

## 2018-02-17 MED ORDER — METHOCARBAMOL 500 MG PO TABS: 500.0000 mg | ORAL_TABLET | Freq: Four times a day (QID) | ORAL | 0 refills | Status: DC | PRN

## 2018-02-17 MED ORDER — TRAMADOL HCL 50 MG PO TABS: 50.0000 mg | ORAL_TABLET | Freq: Four times a day (QID) | ORAL | 0 refills | Status: DC | PRN

## 2018-02-17 NOTE — Discharge Summary (Signed)
Physician Discharge Summary   Patient ID: Elizabeth Douglas MRN: [id number] DOB/AGE: 1951/02/26 67 y.o.  Admit date: 02/16/2018 Discharge date: 02-18-2018  Primary Diagnosis:  Osteoarthritis of the Right  hip.    Admission Diagnoses:  Past Medical History:  Diagnosis Date  . Anal fissure   . Arthritis    hip  . Atypical chest pain   . Basal cell carcinoma   . Complication of anesthesia    BP drops and passes out  [place] vomiting  . Excess or deficiency of vitamin D   . Gallbladder polyp   . GERD (gastroesophageal reflux disease)   . Hemorrhoids   . Microhematuria    pt. denies  . Osteopenia   . PONV (postoperative nausea and vomiting)   . Thrombocytopenia ([place])    Discharge Diagnoses:   Principal Problem:   OA (osteoarthritis) of hip  Estimated body mass index is 24.28 kg/m as calculated from the following:   Height as of this encounter: 5' 7" (1.702 m).   Weight as of this encounter: 70.3 kg (155 lb).  Procedure(s) (LRB): RIGHT TOTAL HIP ARTHROPLASTY ANTERIOR APPROACH (Right)   Consults: None  HPI: Elizabeth Douglas is a 67 y.o. female who has advanced end-  stage arthritis of their Right  hip with progressively worsening pain and  dysfunction.The patient has failed nonoperative management and presents for  total hip arthroplasty.    Laboratory Data: Admission on 02/16/2018  Component Date Value Ref Range Status  . ABO/RH(D) 02/16/2018 O POS   Final  . Antibody Screen 02/16/2018 POS   Final  . Sample Expiration 02/16/2018 02/19/2018   Final  . WBC 02/17/2018 8.8  4.0 - 10.5 K/uL Final  . RBC 02/17/2018 3.35* 3.87 - 5.11 MIL/uL Final  . Hemoglobin 02/17/2018 10.7* 12.0 - 15.0 g/dL Final  . HCT 02/17/2018 32.2* 36.0 - 46.0 % Final  . MCV 02/17/2018 96.1  78.0 - 100.0 fL Final  . MCH 02/17/2018 31.9  26.0 - 34.0 pg Final  . MCHC 02/17/2018 33.2  30.0 - 36.0 g/dL Final  . RDW 02/17/2018 12.7  11.5 - 15.5 % Final  . Platelets 02/17/2018 142* 150 - 400 K/uL Final   Performed at [facility], [place] [address], [place], [place] [address]  . Sodium 02/17/2018 140  135 - 145 mmol/L Final  . Potassium 02/17/2018 4.5  3.5 - 5.1 mmol/L Final  . Chloride 02/17/2018 106  101 - 111 mmol/L Final  . CO2 02/17/2018 27  22 - 32 mmol/L Final  . Glucose, Bld 02/17/2018 117* 65 - 99 mg/dL Final  . BUN 02/17/2018 14  6 - 20 mg/dL Final  . Creatinine, Ser 02/17/2018 0.63  0.44 - 1.00 mg/dL Final  . Calcium 02/17/2018 8.2* 8.9 - 10.3 mg/dL Final  . GFR calc non Af Amer 02/17/2018 >60  >60 mL/min Final  . GFR calc Af Amer 02/17/2018 >60  >60 mL/min Final   Comment: (NOTE) The eGFR has been calculated using the CKD EPI equation. This calculation has not been validated in all clinical situations. eGFR's persistently <60 mL/min signify possible Chronic Kidney Disease.   Georgiann Hahn gap 02/17/2018 7  5 - 15 Final   Performed at [facility], [place] [address] [place], [place] [address]  Hospital Outpatient Visit on 02/08/2018  Component Date Value Ref Range Status  . aPTT 02/08/2018 30  24 - 36 seconds Final   Performed at [facility], [address] [address]  [place], [place] [address]  . WBC 02/08/2018 5.1  4.0 - 10.5 K/uL Final  . RBC 02/08/2018 4.38  3.87 - 5.11 MIL/uL Final  . Hemoglobin 02/08/2018 14.2  12.0 - 15.0 g/dL Final  . HCT 02/08/2018 41.8  36.0 - 46.0 % Final  . MCV 02/08/2018 95.4  78.0 - 100.0 fL Final  . MCH 02/08/2018 32.4  26.0 - 34.0 pg Final  . MCHC 02/08/2018 34.0  30.0 - 36.0 g/dL Final  . RDW 02/08/2018 12.5  11.5 - 15.5 % Final  . Platelets 02/08/2018 152  150 - 400 K/uL Final   Performed at [facility], [place] [address] [place], [place] [address]  . Sodium 02/08/2018 141  135 - 145 mmol/L Final  . Potassium 02/08/2018 4.2  3.5 - 5.1 mmol/L Final  . Chloride 02/08/2018 104  101 - 111 mmol/L Final  . CO2 02/08/2018 29  22 - 32 mmol/L Final  . Glucose, Bld 02/08/2018 88  65 - 99  mg/dL Final  . BUN 02/08/2018 15  6 - 20 mg/dL Final  . Creatinine, Ser 02/08/2018 0.61  0.44 - 1.00 mg/dL Final  . Calcium 02/08/2018 9.2  8.9 - 10.3 mg/dL Final  . Total Protein 02/08/2018 6.8  6.5 - 8.1 g/dL Final  . Albumin 02/08/2018 3.9  3.5 - 5.0 g/dL Final  . AST 02/08/2018 19  15 - 41 U/L Final  . ALT 02/08/2018 12* 14 - 54 U/L Final  . Alkaline Phosphatase 02/08/2018 53  38 - 126 U/L Final  . Total Bilirubin 02/08/2018 0.5  0.3 - 1.2 mg/dL Final  . GFR calc non Af Amer 02/08/2018 >60  >60 mL/min Final  . GFR calc Af Amer 02/08/2018 >60  >60 mL/min Final   Comment: (NOTE) The eGFR has been calculated using the CKD EPI equation. This calculation has not been validated in all clinical situations. eGFR's persistently <60 mL/min signify possible Chronic Kidney Disease.   Georgiann Hahn gap 02/08/2018 8  5 - 15 Final   Performed at [facility], [place] [address], [place], [place] [address]  . Prothrombin Time 02/08/2018 12.9  11.4 - 15.2 seconds Final  . INR 02/08/2018 0.98   Final   Performed at [facility], [place] [address], [place], [place] [address]  . ABO/RH(D) 02/08/2018 O POS   Final  . Antibody Screen 02/08/2018 POS   Final  . Sample Expiration 02/08/2018 02/11/2018   Final  . Extend sample reason 02/08/2018 NO TRANSFUSIONS OR PREGNANCY IN THE PAST 3 MONTHS   Final  . Antibody Identification [id number] ANTI K   Final  . PT AG Type 02/08/2018    Final                   Value:NEGATIVE FOR KELL ANTIGEN Performed at [facility], [place] [address], [place], [place] [address]   . MRSA, PCR 02/08/2018 NEGATIVE  NEGATIVE Final  . Staphylococcus aureus 02/08/2018 NEGATIVE  NEGATIVE Final   Comment: (NOTE) The Xpert SA Assay (FDA approved for NASAL specimens in patients 74 years of age and older), is one component of a comprehensive surveillance program. It is not intended to diagnose infection nor to guide or monitor  treatment. Performed at [facility], [place] [address], [place], [place] [address]      X-Rays:Dg Pelvis Portable  Result Date: 02/16/2018 CLINICAL DATA:  Status post right hip replacement EXAM: PORTABLE PELVIS 1-2 VIEWS COMPARISON:  Intraoperative films  from earlier in the same day. FINDINGS: Right hip prosthesis is noted in satisfactory position. Surgical drain is noted in place. No acute soft tissue or bony abnormality is noted. IMPRESSION: Status post right hip replacement. Electronically Signed   By: Inez Catalina M.D.   On: 02/16/2018 12:00   Dg C-arm 1-60 Min-no Report  Result Date: 02/16/2018 Fluoroscopy was utilized by the requesting physician.  No radiographic interpretation.    EKG: Orders placed or performed during the hospital encounter of 11/29/15  . EKG 12-Lead  . EKG 12-Lead  . EKG     Hospital Course: Patient was admitted to [facility] and taken to the OR and underwent the above state procedure without complications.  Patient tolerated the procedure well and was later transferred to the recovery room and then to the orthopaedic floor for postoperative care.  They were given PO and IV analgesics for pain control following their surgery.  They were given 24 hours of postoperative antibiotics of  Anti-infectives (From admission, onward)   Start     Dose/Rate Route Frequency Ordered Stop   02/16/18 1400  ceFAZolin (ANCEF) IVPB 1 g/50 mL premix     1 g 100 mL/hr over 30 Minutes Intravenous Every 6 hours 02/16/18 1143 02/16/18 1946   02/16/18 0633  ceFAZolin (ANCEF) IVPB 2g/100 mL premix     2 g 200 mL/hr over 30 Minutes Intravenous On call to O.R. 02/16/18 3846 02/16/18 0902     and started on DVT prophylaxis in the form of Xarelto.   PT and OT were ordered for total hip protocol.  The patient was allowed to be WBAT with therapy. Discharge planning was consulted to help with postop disposition and equipment needs.  Patient had a decent night on  the evening of surgery.  They started to get up OOB with therapy on day one but had some low pressures that morning.  Received fluids that day.  Hemovac drain was pulled without difficulty.  Continued to work with therapy into day two.  Dressing was changed on day two and the incision was healing well.  Patient was seen in rounds by Dr. Wynelle Link, symptoms had resolved and was ready to go home.   Diet - Cardiac diet Follow up - in 2 weeks Activity - WBAT Disposition - Home Condition Upon Discharge - improving D/C Meds - See DC Summary DVT Prophylaxis - Xarelto    Discharge Instructions    Call MD / Call 911   Complete by:  As directed    If you experience chest pain or shortness of breath, CALL 911 and be transported to the hospital emergency room.  If you develope a fever above 101 F, pus (white drainage) or increased drainage or redness at the wound, or calf pain, call your surgeon's office.   Change dressing   Complete by:  As directed    You may change your dressing dressing daily with sterile 4 x 4 inch gauze dressing and paper tape.  Do not submerge the incision under water.   Constipation Prevention   Complete by:  As directed    Drink plenty of fluids.  Prune juice may be helpful.  You may use a stool softener, such as Colace (over the counter) 100 mg twice a day.  Use MiraLax (over the counter) for constipation as needed.   Diet - low sodium heart healthy   Complete by:  As directed    Discharge instructions   Complete by:  As directed  Take Xarelto for two and a half more weeks, then discontinue Xarelto. Once the patient has completed the blood thinner regimen, then take a Baby 81 mg Aspirin daily for three more weeks.   Pick up stool softner and laxative for home use following surgery while on pain medications. Do not submerge incision under water. Please use good hand washing techniques while changing dressing each day. May shower starting three days after  surgery. Please use a clean towel to pat the incision dry following showers. Continue to use ice for pain and swelling after surgery. Do not use any lotions or creams on the incision until instructed by your surgeon.  Wear both TED hose on both legs during the day every day for three weeks, but may remove the TED hose at night at home.  Postoperative Constipation Protocol  Constipation - defined medically as fewer than three stools per week and severe constipation as less than one stool per week.  One of the most common issues patients have following surgery is constipation.  Even if you have a regular bowel pattern at home, your normal regimen is likely to be disrupted due to multiple reasons following surgery.  Combination of anesthesia, postoperative narcotics, change in appetite and fluid intake all can affect your bowels.  In order to avoid complications following surgery, here are some recommendations in order to help you during your recovery period.  Colace (docusate) - Pick up an over-the-counter form of Colace or another stool softener and take twice a day as long as you are requiring postoperative pain medications.  Take with a full glass of water daily.  If you experience loose stools or diarrhea, hold the colace until you stool forms back up.  If your symptoms do not get better within 1 week or if they get worse, check with your doctor.  Dulcolax (bisacodyl) - Pick up over-the-counter and take as directed by the product packaging as needed to assist with the movement of your bowels.  Take with a full glass of water.  Use this product as needed if not relieved by Colace only.   MiraLax (polyethylene glycol) - Pick up over-the-counter to have on hand.  MiraLax is a solution that will increase the amount of water in your bowels to assist with bowel movements.  Take as directed and can mix with a glass of water, juice, soda, coffee, or tea.  Take if you go more than two days without a  movement. Do not use MiraLax more than once per day. Call your doctor if you are still constipated or irregular after using this medication for 7 days in a row.  If you continue to have problems with postoperative constipation, please contact the office for further assistance and recommendations.  If you experience "the worst abdominal pain ever" or develop nausea or vomiting, please contact the office immediatly for further recommendations for treatment.   Do not sit on low chairs, stoools or toilet seats, as it may be difficult to get up from low surfaces   Complete by:  As directed    Driving restrictions   Complete by:  As directed    No driving until released by the physician.   Increase activity slowly as tolerated   Complete by:  As directed    Lifting restrictions   Complete by:  As directed    No lifting until released by the physician.   Patient may shower   Complete by:  As directed    You may  shower without a dressing once there is no drainage.  Do not wash over the wound.  If drainage remains, do not shower until drainage stops.   TED hose   Complete by:  As directed    Use stockings (TED hose) for 3 weeks on both leg(s).  You may remove them at night for sleeping.   Weight bearing as tolerated   Complete by:  As directed    Laterality:  right   Extremity:  Lower     Allergies as of 02/17/2018      Reactions   Fish Allergy Swelling   Pt felt like throat was closing last time she ate     Med Rec must be completed prior to using this [facility]      Discharge Care Instructions  (From admission, onward)        Start     Ordered   02/17/18 0000  Weight bearing as tolerated    Question Answer Comment  Laterality right   Extremity Lower      02/17/18 0932   02/17/18 0000  Change dressing    Comments:  You may change your dressing dressing daily with sterile 4 x 4 inch gauze dressing and paper tape.  Do not submerge the incision under water.   02/17/18 0932      Follow-up Information    Gaynelle Arabian, MD. Schedule an appointment as soon as possible for a visit on 03/01/2018.   Specialty:  Orthopedic Surgery Contact information: [address] [place] [address] [phone]           Signed: Arlee Muslim, PA-C Orthopaedic Surgery 02/17/2018, 9:33 AM

## 2018-02-17 NOTE — Progress Notes (Signed)
Patient continues to complain of lightheadedness, dizziness, and nausea after moving, and working with PT.  Patient given 2, 533mL bolus' to help with symptoms, as well as nausea, however issues have not been completely resolved.   RN called PA, and discharge was cancelled for today.

## 2018-02-17 NOTE — Evaluation (Signed)
Physical Therapy Evaluation Patient Details Name: Elizabeth Douglas MRN: [id number] DOB: October 28, 1951 Today's Date: 02/17/2018   History of Present Illness  Pt is a 67 year old female s/p R direct anterior THA.  Clinical Impression  Pt is s/p THA resulting in the deficits listed below (see PT Problem List).  Pt will benefit from skilled PT to increase their independence and safety with mobility to allow discharge to the venue listed below.  Pt with nausea and low BP earlier this morning.  Pt agreeable to attempt mobility as tolerated.  Pt able to ambulate and denies dizziness however reported mild nausea.  Plan to return this afternoon to practice steps and review exercises if pt feels capable.     Follow Up Recommendations Follow surgeon's recommendation for DC plan and follow-up therapies    Equipment Recommendations  None recommended by PT    Recommendations for Other Services       Precautions / Restrictions Precautions Precautions: Fall Restrictions Weight Bearing Restrictions: No Other Position/Activity Restrictions: WBAT      Mobility  Bed Mobility               General bed mobility comments: pt up in recliner on arrival  Transfers Overall transfer level: Needs assistance Equipment used: Rolling walker (2 wheeled) Transfers: Sit to/from Stand Sit to Stand: Min guard         General transfer comment: verbal cues for safe technique  Ambulation/Gait Ambulation/Gait assistance: Min guard Ambulation Distance (Feet): 140 Feet Assistive device: Rolling walker (2 wheeled) Gait Pattern/deviations: Step-to pattern;Step-through pattern;Antalgic     General Gait Details: verbal cues for sequence, RW positioning, posture, step length, able to progress to step through pattern, only reports mild nausea however felt able to continue mobility  Stairs            Wheelchair Mobility    Modified Rankin (Stroke Patients Only)       Balance                                              Pertinent Vitals/Pain Pain Assessment: 0-10 Pain Score: 3  Pain Location: R hip Pain Descriptors / Indicators: Aching;Sore Pain Intervention(s): Limited activity within patient's tolerance;Repositioned;Monitored during session;Ice applied    Home Living Family/patient expects to be discharged to:: Private residence Living Arrangements: Spouse/significant other Available Help at Discharge: Family;Available 24 hours/day Type of Home: House Home Access: Stairs to enter Entrance Stairs-Rails: Right Entrance Stairs-Number of Steps: 3 Home Layout: Able to live on main level with bedroom/bathroom;Two level;Bed/bath upstairs Home Equipment: Walker - 2 wheels      Prior Function Level of Independence: Independent               Hand Dominance        Extremity/Trunk Assessment        Lower Extremity Assessment Lower Extremity Assessment: RLE deficits/detail RLE Deficits / Details: anticipated post op hip weakness       Communication   Communication: No difficulties  Cognition Arousal/Alertness: Awake/alert Behavior During Therapy: WFL for tasks assessed/performed Overall Cognitive Status: Within Functional Limits for tasks assessed                                        General Comments  Exercises     Assessment/Plan    PT Assessment Patient needs continued PT services  PT Problem List Decreased mobility;Decreased activity tolerance;Decreased strength;Decreased knowledge of use of DME;Pain       PT Treatment Interventions Stair training;Functional mobility training;Gait training;Therapeutic exercise;Patient/family education;DME instruction;Therapeutic activities    PT Goals (Current goals can be found in the Care Plan section)  Acute Rehab PT Goals PT Goal Formulation: With patient/family Time For Goal Achievement: 02/21/18 Potential to Achieve Goals: Good    Frequency 7X/week   Barriers to  discharge        Co-evaluation               AM-PAC PT "6 Clicks" Daily Activity  Outcome Measure Difficulty turning over in bed (including adjusting bedclothes, sheets and blankets)?: A Little Difficulty moving from lying on back to sitting on the side of the bed? : Unable Difficulty sitting down on and standing up from a chair with arms (e.g., wheelchair, bedside commode, etc,.)?: Unable Help needed moving to and from a bed to chair (including a wheelchair)?: A Little Help needed walking in hospital room?: A Little Help needed climbing 3-5 steps with a railing? : A Lot 6 Click Score: 13    End of Session Equipment Utilized During Treatment: Gait belt Activity Tolerance: Patient tolerated treatment well Patient left: in chair;with call bell/phone within reach;with family/visitor present Nurse Communication: Mobility status PT Visit Diagnosis: Difficulty in walking, not elsewhere classified (R26.2)    Time: 1050-1104 PT Time Calculation (min) (ACUTE ONLY): 14 min   Charges:   PT Evaluation $PT Eval Low Complexity: 1 Low     PT G CodesCarmelia Bake, PT, DPT 02/17/2018 Pager: [phone]  [name] 02/17/2018, 12:06 PM

## 2018-02-17 NOTE — Progress Notes (Signed)
Discharge planning, spoke with patient and spouse at bedside. Have chosen [facility] for HH PT, evaluate and treat. Contacted [facility] for referral. Has RW and 3n1. [phone] 

## 2018-02-17 NOTE — Progress Notes (Signed)
Physical Therapy Treatment Patient Details Name: Elizabeth Douglas MRN: [id number] DOB: 25-Jun-1951 Today's Date: 02/17/2018    History of Present Illness Pt is a 67 year old female s/p R direct anterior THA.    PT Comments    Pt ambulated in hallway and practiced safe stair technique however with nausea upon finishing steps so returned to room in recliner.  Reviewed a couple exercises to perform later today as pt believes she will likely stay overnight now and d/c home tomorrow.    Follow Up Recommendations  Follow surgeon's recommendation for DC plan and follow-up therapies     Equipment Recommendations  None recommended by PT    Recommendations for Other Services       Precautions / Restrictions Precautions Precautions: Fall Restrictions Other Position/Activity Restrictions: WBAT    Mobility  Bed Mobility               General bed mobility comments: pt up in recliner on arrival  Transfers Overall transfer level: Needs assistance Equipment used: Rolling walker (2 wheeled) Transfers: Sit to/from Stand Sit to Stand: Min guard         General transfer comment: verbal cues for safe technique  Ambulation/Gait Ambulation/Gait assistance: Min guard Ambulation Distance (Feet): 80 Feet Assistive device: Rolling walker (2 wheeled) Gait Pattern/deviations: Antalgic;Step-through pattern     General Gait Details: verbal cues for sequence, RW positioning, posture, step length   Stairs Stairs: Yes   Stair Management: One rail Right;Step to pattern;Forwards Number of Stairs: 3 General stair comments: verbal cues for safety and sequence, performed twice, spouse observed, pt with nausea after completion so assisted to recliner and taken back to room in recliner  Wheelchair Mobility    Modified Rankin (Stroke Patients Only)       Balance                                            Cognition Arousal/Alertness: Awake/alert Behavior During  Therapy: WFL for tasks assessed/performed Overall Cognitive Status: Within Functional Limits for tasks assessed                                        Exercises Total Joint Exercises Ankle Circles/Pumps: 5 reps;AROM;Both Quad Sets: AROM;5 reps;Both Heel Slides: AAROM;5 reps;Right Hip ABduction/ADduction: AAROM;Right;5 reps    General Comments        Pertinent Vitals/Pain Pain Assessment: 0-10 Pain Score: 2  Pain Location: R hip Pain Descriptors / Indicators: Aching;Sore Pain Intervention(s): Limited activity within patient's tolerance;Repositioned;Monitored during session;Ice applied    Home Living Family/patient expects to be discharged to:: Private residence Living Arrangements: Spouse/significant other Available Help at Discharge: Family;Available 24 hours/day Type of Home: House Home Access: Stairs to enter Entrance Stairs-Rails: Right Home Layout: Able to live on main level with bedroom/bathroom;Two level;Bed/bath upstairs Home Equipment: Walker - 2 wheels      Prior Function Level of Independence: Independent          PT Goals (current goals can now be found in the care plan section) Acute Rehab PT Goals PT Goal Formulation: With patient/family Time For Goal Achievement: 02/21/18 Potential to Achieve Goals: Good Progress towards PT goals: Progressing toward goals    Frequency    7X/week      PT Plan Current plan  remains appropriate    Co-evaluation              AM-PAC PT "6 Clicks" Daily Activity  Outcome Measure  Difficulty turning over in bed (including adjusting bedclothes, sheets and blankets)?: A Little Difficulty moving from lying on back to sitting on the side of the bed? : A Lot Difficulty sitting down on and standing up from a chair with arms (e.g., wheelchair, bedside commode, etc,.)?: A Lot Help needed moving to and from a bed to chair (including a wheelchair)?: A Little Help needed walking in hospital room?: A  Little Help needed climbing 3-5 steps with a railing? : A Little 6 Click Score: 16    End of Session Equipment Utilized During Treatment: Gait belt Activity Tolerance: Patient tolerated treatment well Patient left: in chair;with call bell/phone within reach;with family/visitor present Nurse Communication: Mobility status PT Visit Diagnosis: Difficulty in walking, not elsewhere classified (R26.2)     Time: 6213-0865 PT Time Calculation (min) (ACUTE ONLY): 19 min  Charges:  $Gait Training: 8-22 mins                    G Codes:       Carmelia Bake, PT, DPT 02/17/2018 Pager: [phone]  [name] 02/17/2018, 2:44 PM

## 2018-02-17 NOTE — Progress Notes (Addendum)
Subjective: 1 Day Post-Op Procedure(s) (LRB): RIGHT TOTAL HIP ARTHROPLASTY ANTERIOR APPROACH (Right) Patient reports pain as mild.   Patient seen in rounds with Dr. Wynelle Link. Patient is well, but has had some minor complaints of pain in the hip, requiring pain medications We will start therapy today.  If they do well with therapy and meets all goals, then will allow home later this afternoon following therapy. Plan is to go Home after hospital stay.  Patient with low pressures this morning and following surgery yesterday.  Already received fluid bolus and rechecking BP's.  Will check Orthostatic pressures this morning and see how she does with therapy.  UOP around 200 since MN.  Will follow output to ensure that it increases thru the day prior to discharge.   Objective: Vital signs in last 24 hours: Temp:  [97.4 F (36.3 C)-99.1 F (37.3 C)] 98.1 F (36.7 C) (03/21 0506) Pulse Rate:  [50-80] 69 (03/21 0802) Resp:  [12-18] 18 (03/21 0802) BP: (89-112)/(51-78) 89/51 (03/21 0802) SpO2:  [95 %-100 %] 95 % (03/21 0802) Weight:  [70.3 kg (155 lb)] 70.3 kg (155 lb) (03/20 1140)  Intake/Output from previous day:  Intake/Output Summary (Last 24 hours) at 02/17/2018 0913 Last data filed at 02/17/2018 0836 Gross per 24 hour  Intake 4315 ml  Output 1750 ml  Net 2565 ml    Intake/Output this shift: Total I/O In: 1360 [P.O.:360; IV Piggyback:1000] Out: -   Labs: Recent Labs    02/17/18 0546  HGB 10.7*   Recent Labs    02/17/18 0546  WBC 8.8  RBC 3.35*  HCT 32.2*  PLT 142*   Recent Labs    02/17/18 0546  NA 140  K 4.5  CL 106  CO2 27  BUN 14  CREATININE 0.63  GLUCOSE 117*  CALCIUM 8.2*   No results for input(s): LABPT, INR in the last 72 hours.  EXAM General - Patient is Alert, Appropriate and Oriented Extremity - Neurovascular intact Sensation intact distally Intact pulses distally Dorsiflexion/Plantar flexion intact Dressing - dressing C/D/I Motor  Function - intact, moving foot and toes well on exam.  Hemovac pulled without difficulty.  Past Medical History:  Diagnosis Date  . Anal fissure   . Arthritis    hip  . Atypical chest pain   . Basal cell carcinoma   . Complication of anesthesia    BP drops and passes out  [place] vomiting  . Excess or deficiency of vitamin D   . Gallbladder polyp   . GERD (gastroesophageal reflux disease)   . Hemorrhoids   . Microhematuria    pt. denies  . Osteopenia   . PONV (postoperative nausea and vomiting)   . Thrombocytopenia (HCC)     Assessment/Plan: 1 Day Post-Op Procedure(s) (LRB): RIGHT TOTAL HIP ARTHROPLASTY ANTERIOR APPROACH (Right) Principal Problem:   OA (osteoarthritis) of hip  Estimated body mass index is 24.28 kg/m as calculated from the following:   Height as of this encounter: 5\' 7"  (1.702 m).   Weight as of this encounter: 70.3 kg (155 lb). Advance diet Up with therapy  DVT Prophylaxis - Xarelto Weight Bearing As Tolerated right Leg Hemovac Pulled Begin Therapy  If meets goals and able to go home: Up with therapy Diet - Cardiac diet Follow up - in 2 weeks Activity - WBAT Disposition - Home Condition Upon Discharge - pending therapy D/C Meds - See DC Summary DVT Prophylaxis - [place], PA-C Orthopaedic Surgery 02/17/2018, 9:13 AM

## 2018-02-18 LAB — CBC
HEMATOCRIT: 33.8 % — AB (ref 36.0–46.0)
HEMOGLOBIN: 11 g/dL — AB (ref 12.0–15.0)
MCH: 31.4 pg (ref 26.0–34.0)
MCHC: 32.5 g/dL (ref 30.0–36.0)
MCV: 96.6 fL (ref 78.0–100.0)
Platelets: 144 10*3/uL — ABNORMAL LOW (ref 150–400)
RBC: 3.5 MIL/uL — ABNORMAL LOW (ref 3.87–5.11)
RDW: 12.8 % (ref 11.5–15.5)
WBC: 9.9 10*3/uL (ref 4.0–10.5)

## 2018-02-18 LAB — BASIC METABOLIC PANEL
Anion gap: 8 (ref 5–15)
BUN: 9 mg/dL (ref 6–20)
CHLORIDE: 109 mmol/L (ref 101–111)
CO2: 23 mmol/L (ref 22–32)
CREATININE: 0.61 mg/dL (ref 0.44–1.00)
Calcium: 8.4 mg/dL — ABNORMAL LOW (ref 8.9–10.3)
GFR calc Af Amer: >60 mL/min (ref >60)
GFR calc non Af Amer: >60 mL/min (ref >60)
GLUCOSE: 94 mg/dL (ref 65–99)
Potassium: 4 mmol/L (ref 3.5–5.1)
Sodium: 140 mmol/L (ref 135–145)

## 2018-02-18 NOTE — Progress Notes (Signed)
   Subjective: 2 Days Post-Op Procedure(s) (LRB): RIGHT TOTAL HIP ARTHROPLASTY ANTERIOR APPROACH (Right) Patient reports pain as mild.   Feels much better today. Nausea and light headedness have resolved Plan is to go Home after hospital stay.  Objective: Vital signs in last 24 hours: Temp:  [97.5 F (36.4 C)-98.7 F (37.1 C)] 98.2 F (36.8 C) (03/22 0555) Pulse Rate:  [66-86] 69 (03/22 0555) Resp:  [16-18] 16 (03/22 0555) BP: (89-120)/(51-68) 106/55 (03/22 0555) SpO2:  [94 %-98 %] 94 % (03/22 0555)  Intake/Output from previous day:  Intake/Output Summary (Last 24 hours) at 02/18/2018 0735 Last data filed at 02/18/2018 0600 Gross per 24 hour  Intake 2387.08 ml  Output 2500 ml  Net -112.92 ml    Intake/Output this shift: No intake/output data recorded.  Labs: Recent Labs    02/17/18 0546 02/18/18 0553  HGB 10.7* 11.0*   Recent Labs    02/17/18 0546 02/18/18 0553  WBC 8.8 9.9  RBC 3.35* 3.50*  HCT 32.2* 33.8*  PLT 142* 144*   Recent Labs    02/17/18 0546 02/18/18 0553  NA 140 140  K 4.5 4.0  CL 106 109  CO2 27 23  BUN 14 9  CREATININE 0.63 0.61  GLUCOSE 117* 94  CALCIUM 8.2* 8.4*   No results for input(s): LABPT, INR in the last 72 hours.  EXAM General - Patient is Alert, Appropriate and Oriented Extremity - Neurologically intact Neurovascular intact Incision: dressing C/D/I No cellulitis present Compartment soft Dressing/Incision - clean, dry, no drainage Motor Function - intact, moving foot and toes well on exam.   Past Medical History:  Diagnosis Date  . Anal fissure   . Arthritis    hip  . Atypical chest pain   . Basal cell carcinoma   . Complication of anesthesia    BP drops and passes out  [place] vomiting  . Excess or deficiency of vitamin D   . Gallbladder polyp   . GERD (gastroesophageal reflux disease)   . Hemorrhoids   . Microhematuria    pt. denies  . Osteopenia   . PONV (postoperative nausea and vomiting)   .  Thrombocytopenia (HCC)     Assessment/Plan: 2 Days Post-Op Procedure(s) (LRB): RIGHT TOTAL HIP ARTHROPLASTY ANTERIOR APPROACH (Right) Principal Problem:   OA (osteoarthritis) of hip   Up with therapy Discharge home with home health  DVT Prophylaxis - Xarelto Weight Bearing As Tolerated right Leg  Gaynelle Arabian 02/18/2018, 7:35 AM

## 2018-02-18 NOTE — Progress Notes (Signed)
Physical Therapy Treatment Patient Details Name: Elizabeth Douglas MRN: [id number] DOB: [birth year] Today's Date: 02/18/2018    History of Present Illness Pt is a 67 year old female s/p R direct anterior THA.    PT Comments    Pt ambulated and practiced steps again today however no reports of nausea.  Pt also performed LE exercises and reviewed HEP handout.  Pt reports understanding and feels ready for d/c home today with spouse.  Follow Up Recommendations  Follow surgeon's recommendation for DC plan and follow-up therapies     Equipment Recommendations  None recommended by PT    Recommendations for Other Services       Precautions / Restrictions Precautions Precautions: Fall Restrictions Weight Bearing Restrictions: No Other Position/Activity Restrictions: WBAT    Mobility  Bed Mobility               General bed mobility comments: pt up in recliner on arrival  Transfers Overall transfer level: Needs assistance Equipment used: Rolling walker (2 wheeled) Transfers: Sit to/from Stand Sit to Stand: Min guard;Supervision         General transfer comment: able to recall safe technique  Ambulation/Gait Ambulation/Gait assistance: Min guard Ambulation Distance (Feet): 160 Feet Assistive device: Rolling walker (2 wheeled) Gait Pattern/deviations: Antalgic;Step-through pattern     General Gait Details: verbal cues for sequence, RW positioning, posture, step length   Stairs Stairs: Yes   Stair Management: One rail Right;Step to pattern;Forwards Number of Stairs: 3 General stair comments: verbal cues for safety and sequence, performed twice, spouse observed  Wheelchair Mobility    Modified Rankin (Stroke Patients Only)       Balance                                            Cognition Arousal/Alertness: Awake/alert Behavior During Therapy: WFL for tasks assessed/performed Overall Cognitive Status: Within Functional Limits for tasks  assessed                                        Exercises Total Joint Exercises Ankle Circles/Pumps: AROM;Both;10 reps Quad Sets: AROM;Both;10 reps Heel Slides: AAROM;Right;10 reps;Supine Hip ABduction/ADduction: AAROM;Right;10 reps;Supine;Standing Long Arc Quad: AROM;10 reps;Right;Seated Knee Flexion: AROM;10 reps;Standing;Right Marching in Standing: AROM;10 reps;Right;Standing Standing Hip Extension: AROM;Right;10 reps;Standing    General Comments        Pertinent Vitals/Pain Pain Assessment: 0-10 Pain Score: 2  Pain Location: R hip Pain Descriptors / Indicators: Aching;Sore Pain Intervention(s): Repositioned;Limited activity within patient's tolerance;Monitored during session;Ice applied    Home Living                      Prior Function            PT Goals (current goals can now be found in the care plan section) Progress towards PT goals: Progressing toward goals    Frequency    7X/week      PT Plan Current plan remains appropriate    Co-evaluation              AM-PAC PT "6 Clicks" Daily Activity  Outcome Measure  Difficulty turning over in bed (including adjusting bedclothes, sheets and blankets)?: A Little Difficulty moving from lying on back to sitting on the side of the bed? :  A Little Difficulty sitting down on and standing up from a chair with arms (e.g., wheelchair, bedside commode, etc,.)?: A Little Help needed moving to and from a bed to chair (including a wheelchair)?: A Little Help needed walking in hospital room?: A Little Help needed climbing 3-5 steps with a railing? : A Little 6 Click Score: 18    End of Session   Activity Tolerance: Patient tolerated treatment well Patient left: in chair;with call bell/phone within reach;with family/visitor present Nurse Communication: Mobility status PT Visit Diagnosis: Difficulty in walking, not elsewhere classified (R26.2)     Time: 2446-2863 PT Time Calculation  (min) (ACUTE ONLY): 17 min  Charges:  $Therapeutic Exercise: 8-22 mins                    G Codes:      Carmelia Bake, PT, DPT 02/18/2018 Pager: [phone]  [name] 02/18/2018, 12:30 PM

## 2018-03-21 ENCOUNTER — Other Ambulatory Visit: Payer: Self-pay

## 2018-03-21 MED ORDER — OMEPRAZOLE 20 MG PO CPDR: 20.0000 mg | DELAYED_RELEASE_CAPSULE | Freq: Two times a day (BID) | ORAL | 3 refills | Status: DC

## 2018-03-25 ENCOUNTER — Encounter: Payer: Self-pay | Admitting: Family Medicine

## 2018-05-10 NOTE — Progress Notes (Signed)
Established Previous Bypass   History of Present Illness   Elizabeth Douglas is a 67 y.o. (08-23-1951) female who presents with chief complaint: none.    Prior procedures include: 1.  R pop, AT, and PT TE, R AK to BK pop bypass with NR ips GSV (11/30/15)   Pt recently underwent R TKR (02/16/18).  The patient's symptoms have resolved, suggesting R hip was responsible much of her residual R leg sx.  The patient's treatment regimen currently included: maximal medical management.  The patient's PMH, PSH, SH, and FamHx were reviewed on 05/11/18 are unchanged from 11/05/17.  Current Outpatient Medications  Medication Sig Dispense Refill  . acetaminophen (TYLENOL) 500 MG tablet Take 1,000 mg by mouth at bedtime as needed for moderate pain.     . Carboxymethylcellul-Glycerin (LUBRICATING EYE DROPS OP) Place 1 drop into both eyes daily.    . methocarbamol (ROBAXIN) 500 MG tablet Take 1 tablet (500 mg total) by mouth every 6 (six) hours as needed for muscle spasms. 60 tablet 0  . omeprazole (PRILOSEC) 20 MG capsule Take 1 capsule (20 mg total) by mouth 2 (two) times daily. 180 capsule 3  . oxyCODONE (OXY IR/ROXICODONE) 5 MG immediate release tablet Take 1-2 tablets (5-10 mg total) by mouth every 4 (four) hours as needed for moderate pain or severe pain. 60 tablet 0  . rivaroxaban (XARELTO) 10 MG TABS tablet Take 1 tablet (10 mg total) by mouth daily with breakfast. Take Xarelto for two and a half more weeks following discharge from the hospital, then discontinue Xarelto. Once the patient has completed the blood thinner regimen, then take a Baby 81 mg Aspirin daily for three more weeks. 20 tablet 0  . Simethicone (MAALOX ANTI-GAS PO) Take 1 tablet by mouth.    . traMADol (ULTRAM) 50 MG tablet Take 1-2 tablets (50-100 mg total) by mouth every 6 (six) hours as needed (mild pain). 56 tablet 0  . tretinoin (RETIN-A) 0.05 % cream Apply 1 application topically at bedtime.     . Wheat Dextrin (BENEFIBER) POWD  Take 1 Dose by mouth daily.      No current facility-administered medications for this visit.     On ROS today: BLE mild leg swelling, no intermittent claudication    Physical Examination   Vitals:   05/11/18 1301  BP: 108/72  Pulse: 77  Temp: (!) 97.2 F (36.2 C)  SpO2: 94%  Weight: 155 lb (70.3 kg)  Height: 5\' 7"  (1.702 m)   Body mass index is 24.28 kg/m.  General Alert, O x 3, WD, NAD  Pulmonary Sym exp, good B air movt, CTA B  Cardiac RRR, Nl S1, S2, no Murmurs, No rubs, No S3,S4  Vascular Vessel Right Left  Radial Palpable Palpable  Brachial Palpable Palpable  Carotid Palpable, No Bruit Palpable, No Bruit  Aorta Not palpable N/A  Femoral Palpable Palpable  Popliteal Not palpable Not palpable  PT Palpable Palpable  DP Palpable Palpable    Gastro- intestinal soft, non-distended, non-tender to palpation, No guarding or rebound, no HSM, no masses, no CVAT B, No palpable prominent aortic pulse,    Musculo- skeletal M/S 5/5 throughout  , Extremities without ischemic changes  , Non-pitting edema present: 1+ B, No visible varicosities , No Lipodermatosclerosis present  Neurologic Pain and light touch intact in extremities , Motor exam as listed above    Non-invasive Vascular Imaging   ABI (05/11/2018)  R:   ABI: 1.25 (1.00),   PT: tri  DP: tri  TBI:  0.94  L:   ABI: 1.26 (0.76),   PT: tri  DP: tri  TBI: 0.68   Bypass Duplex (05/11/2018)  Widely patent pop-pop bypass   Medical Decision Making   Elizabeth Douglas is a 67 y.o. female who presents with:  s/p R leg TE for acute occlusion found to be acute on chronic popliteal occlusion, R pop-pop BPG w/ NR ips GSV, recent R THR .   Based on the patient's vascular studies and examination, I have offered the patient: BLE ABI, bypass duplex in 6 months (per pt's preference).  I discussed in depth with the patient the nature of atherosclerosis, and emphasized the importance of maximal medical management  including strict control of blood pressure, blood glucose, and lipid levels, obtaining regular exercise, and cessation of smoking.    The patient is aware that without maximal medical management the underlying atherosclerotic disease process will progress, limiting the benefit of any interventions.  I discussed in depth with the patient the need for long term surveillance to improve the primary assisted patency of his bypass.  The patient agrees to cooperate with such.  The patient is currently not on statin per the patient's preference.   The patient is currently on an anti-platelet: ASA.  Thank you for allowing Elizabeth Douglas to participate in this patient's care.   Elizabeth Barthel, MD, FACS [facility] Office: [phone] Pager: [phone]

## 2018-05-11 ENCOUNTER — Ambulatory Visit (HOSPITAL_COMMUNITY)
Admission: RE | Admit: 2018-05-11 | Discharge: 2018-05-11 | Disposition: A | Discharge status: Home / Self Care | Payer: BLUE CROSS/BLUE SHIELD | Source: Ambulatory Visit | Attending: Vascular Surgery | Admitting: Vascular Surgery

## 2018-05-11 ENCOUNTER — Ambulatory Visit: Payer: BLUE CROSS/BLUE SHIELD | Admitting: Vascular Surgery

## 2018-05-11 ENCOUNTER — Encounter: Payer: Self-pay | Admitting: Vascular Surgery

## 2018-05-11 ENCOUNTER — Ambulatory Visit (INDEPENDENT_AMBULATORY_CARE_PROVIDER_SITE_OTHER): Payer: BLUE CROSS/BLUE SHIELD | Admitting: Vascular Surgery

## 2018-05-11 ENCOUNTER — Ambulatory Visit (INDEPENDENT_AMBULATORY_CARE_PROVIDER_SITE_OTHER)
Admission: RE | Admit: 2018-05-11 | Discharge: 2018-05-11 | Disposition: A | Discharge status: Home / Self Care | Payer: BLUE CROSS/BLUE SHIELD | Source: Ambulatory Visit | Attending: Vascular Surgery | Admitting: Vascular Surgery

## 2018-05-11 VITALS — BP 108/72 | HR 77 | Temp 97.2°F | Ht 67.0 in | Wt 155.0 lb

## 2018-05-11 DIAGNOSIS — I70201 Unspecified atherosclerosis of native arteries of extremities, right leg: Secondary | ICD-10-CM

## 2018-05-13 ENCOUNTER — Other Ambulatory Visit: Payer: Self-pay

## 2018-05-13 DIAGNOSIS — I739 Peripheral vascular disease, unspecified: Secondary | ICD-10-CM

## 2018-05-13 DIAGNOSIS — I70201 Unspecified atherosclerosis of native arteries of extremities, right leg: Secondary | ICD-10-CM

## 2018-05-20 ENCOUNTER — Ambulatory Visit (INDEPENDENT_AMBULATORY_CARE_PROVIDER_SITE_OTHER): Payer: BLUE CROSS/BLUE SHIELD | Admitting: Family Medicine

## 2018-05-20 ENCOUNTER — Encounter: Payer: Self-pay | Admitting: Family Medicine

## 2018-05-20 ENCOUNTER — Ambulatory Visit (INDEPENDENT_AMBULATORY_CARE_PROVIDER_SITE_OTHER)
Admission: RE | Admit: 2018-05-20 | Discharge: 2018-05-20 | Disposition: A | Discharge status: Home / Self Care | Payer: BLUE CROSS/BLUE SHIELD | Source: Ambulatory Visit | Attending: Family Medicine | Admitting: Family Medicine

## 2018-05-20 VITALS — BP 102/78 | HR 71 | Temp 98.3°F | Ht 67.0 in | Wt 153.6 lb

## 2018-05-20 DIAGNOSIS — E559 Vitamin D deficiency, unspecified: Secondary | ICD-10-CM

## 2018-05-20 DIAGNOSIS — M85852 Other specified disorders of bone density and structure, left thigh: Secondary | ICD-10-CM

## 2018-05-20 DIAGNOSIS — E785 Hyperlipidemia, unspecified: Secondary | ICD-10-CM

## 2018-05-20 DIAGNOSIS — K219 Gastro-esophageal reflux disease without esophagitis: Secondary | ICD-10-CM

## 2018-05-20 DIAGNOSIS — Z79899 Other long term (current) drug therapy: Secondary | ICD-10-CM

## 2018-05-20 DIAGNOSIS — Z0000 Encounter for general adult medical examination without abnormal findings: Secondary | ICD-10-CM | POA: Diagnosis not present

## 2018-05-20 DIAGNOSIS — D696 Thrombocytopenia, unspecified: Secondary | ICD-10-CM | POA: Diagnosis not present

## 2018-05-20 LAB — COMPREHENSIVE METABOLIC PANEL
ALT: 12 U/L (ref 0–35)
AST: 18 U/L (ref 0–37)
Albumin: 4.3 g/dL (ref 3.5–5.2)
Alkaline Phosphatase: 51 U/L (ref 39–117)
BUN: 11 mg/dL (ref 6–23)
CHLORIDE: 105 meq/L (ref 96–112)
CO2: 31 mEq/L (ref 19–32)
Calcium: 9.6 mg/dL (ref 8.4–10.5)
Creatinine, Ser: 0.69 mg/dL (ref 0.40–1.20)
GFR: 90.77 mL/min (ref >60.00)
GLUCOSE: 95 mg/dL (ref 70–99)
POTASSIUM: 4.7 meq/L (ref 3.5–5.1)
SODIUM: 141 meq/L (ref 135–145)
Total Bilirubin: 0.4 mg/dL (ref 0.2–1.2)
Total Protein: 7.1 g/dL (ref 6.0–8.3)

## 2018-05-20 LAB — CBC
HEMATOCRIT: 41.7 % (ref 36.0–46.0)
HEMOGLOBIN: 14.4 g/dL (ref 12.0–15.0)
MCHC: 34.5 g/dL (ref 30.0–36.0)
MCV: 92.7 fl (ref 78.0–100.0)
Platelets: 181 10*3/uL (ref 150.0–400.0)
RBC: 4.5 Mil/uL (ref 3.87–5.11)
RDW: 13.2 % (ref 11.5–15.5)
WBC: 4.3 10*3/uL (ref 4.0–10.5)

## 2018-05-20 LAB — LIPID PANEL
Cholesterol: 172 mg/dL (ref 0–200)
HDL: 54.8 mg/dL (ref >39.00)
LDL CALC: 102 mg/dL — AB (ref 0–99)
NONHDL: 117.39
Total CHOL/HDL Ratio: 3
Triglycerides: 76 mg/dL (ref 0.0–149.0)
VLDL: 15.2 mg/dL (ref 0.0–40.0)

## 2018-05-20 LAB — VITAMIN D 25 HYDROXY (VIT D DEFICIENCY, FRACTURES): VITD: 43.12 ng/mL (ref 30.00–100.00)

## 2018-05-20 LAB — VITAMIN B12: Vitamin B-12: 349 pg/mL (ref 211–911)

## 2018-05-20 NOTE — Assessment & Plan Note (Signed)
Thrombocytopenia for years- has seen hematology in past with no further work up suggested. Monitor cbc today

## 2018-05-20 NOTE — Assessment & Plan Note (Signed)
Vitamin D- on 2000 units a day. Update vitamin D. Was ok last year

## 2018-05-20 NOTE — Assessment & Plan Note (Addendum)
Osteopenia of left femoral neck-  ordered dexa last year- not done yet as was focusing on hip surgery. Last scan 2015- order dexa again today. Taking vitamin D- works on getting calcium through food.   If needed fosamax would have to watch reflux and may have to change to injectables

## 2018-05-20 NOTE — Assessment & Plan Note (Signed)
GERD- on prilosec 20mg  BID last year, trial daily didn't work in past-  remains twice a day.

## 2018-05-20 NOTE — Patient Instructions (Addendum)
Please stop by the lab before you go.  Please check with your pharmacy to see if they have the shingrix vaccine. If they do- please get this immunization and update [place] by phone call or mychart with dates you receive the vaccine  Schedule your bone density test at check out desk. You may also call directly to X-ray at [phone] to schedule an appointment that is convenient for you.  - located [address] across the [address] from [place] - in the basement - you do need an appointment for the bone density tests.   Schedule a welcome to medicare exam in 1 year - can actually be 9-12 months to make sure we have enough time to do this

## 2018-05-20 NOTE — Progress Notes (Signed)
Your CBC was normal (blood counts, infection fighting cells, platelets). Happy platelets were ok Your CMET was normal (kidney, liver, and electrolytes, blood sugar).  Your cholesterol was improved from last year and only mildly elevated. 10 year risk of heart attack or stroke all the way down to 3% so we can hold off on cholesterol medicine. This is great news.  Your vitamin D and b12 were normal

## 2018-05-20 NOTE — Progress Notes (Signed)
Phone: [phone]  Subjective:  Patient presents today for their annual physical. Chief complaint-noted.   See problem oriented charting- ROS- full  review of systems was completed and negative except for: joint pain  The following were reviewed and entered/updated in epic: Past Medical History:  Diagnosis Date  . Anal fissure   . Arthritis    hip  . Atypical chest pain   . Basal cell carcinoma   . Complication of anesthesia    BP drops and passes out  [place] vomiting  . Excess or deficiency of vitamin D   . Gallbladder polyp   . GERD (gastroesophageal reflux disease)   . Hemorrhoids   . Microhematuria    pt. denies  . Osteopenia   . PONV (postoperative nausea and vomiting)   . Thrombocytopenia [facility])    Patient Active Problem List   Diagnosis Date Noted  . Popliteal artery occlusion, right ([place]) 11/29/2015    Priority: High  . History of skin cancer 04/22/2016    Priority: Medium  . Hyperlipidemia 04/22/2016    Priority: Medium  . THROMBOCYTOPENIA, CHRONIC 11/13/2009    Priority: Medium  . Osteopenia of left femoral neck 09/18/2008    Priority: Medium  . GERD 07/10/2008    Priority: Medium  . Lumbar radiculopathy 11/20/2015    Priority: Low  . Gluteal tendinitis of left buttock 11/14/2015    Priority: Low  . Tensor fascia lata syndrome 10/08/2015    Priority: Low  . Hemorrhoids     Priority: Low  . Thrombosed external hemorrhoid 10/19/2014    Priority: Low  . Anal fissure - posterior 01/29/2014    Priority: Low  . CHEST PAIN 01/10/2009    Priority: Low  . BENIGN POSITIONAL VERTIGO 01/18/2008    Priority: Low  . Vitamin D deficiency 05/20/2018  . OA (osteoarthritis) of hip 02/16/2018   Past Surgical History:  Procedure Laterality Date  . COLONOSCOPY  2005, 2015  . INTRAOPERATIVE ARTERIOGRAM Right 11/29/2015   Procedure: INTRA OPERATIVE ARTERIOGRAM;  Surgeon: Conrad [place], MD;  Location: [place];  Service: Vascular;  Laterality: Right;  . JOINT  REPLACEMENT     right total hip Aluisio 02-16-18  . LUNG BIOPSY    . THROMBECTOMY FEMORAL ARTERY Right 11/29/2015   Procedure:  RIGHT  POPLITEAL to anterior tibial and posterior tibial ARTERY thrombectomy. Right above knee to below knee popliteal artery bypass;  Surgeon: Conrad Mead, MD;  Location: [place];  Service: Vascular;  Laterality: Right;  . TOTAL HIP ARTHROPLASTY Right 02/16/2018   Procedure: RIGHT TOTAL HIP ARTHROPLASTY ANTERIOR APPROACH;  Surgeon: Gaynelle Arabian, MD;  Location: [facility];  Service: Orthopedics;  Laterality: Right;  . UPPER GASTROINTESTINAL ENDOSCOPY  2009    Family History  Problem Relation Age of Onset  . COPD Mother   . Osteoporosis Mother   . Prostate cancer Father   . Heart disease Brother 64       Before age 66  . Heart attack Brother 42       Massive  . Lymphoma Brother        non and hodgkins  . Coronary artery disease Brother   . Colon cancer Neg Hx   . Colon polyps Neg Hx   . Rectal cancer Neg Hx   . Stomach cancer Neg Hx     Medications- reviewed and updated Current Outpatient Medications  Medication Sig Dispense Refill  . acetaminophen (TYLENOL) 500 MG tablet Take 1,000 mg by mouth at bedtime as needed  for moderate pain.     Marland Kitchen aspirin (ASPIRIN 81) 81 MG chewable tablet Chew by mouth daily.    . Carboxymethylcellul-Glycerin (LUBRICATING EYE DROPS OP) Place 1 drop into both eyes daily.    Marland Kitchen omeprazole (PRILOSEC) 20 MG capsule Take 1 capsule (20 mg total) by mouth 2 (two) times daily. 180 capsule 3  . Simethicone (ANTI-GAS/80 PO) Take by mouth at bedtime.    . tretinoin (RETIN-A) 0.05 % cream Apply 1 application topically at bedtime.     . Wheat Dextrin (BENEFIBER) POWD Take 1 Dose by mouth daily.      No current facility-administered medications for this visit.     Allergies-reviewed and updated Allergies  Allergen Reactions  . Fish Allergy Swelling    Pt felt like throat was closing last time she ate     Social History   Social  History Narrative   Married. 1 daughter. No grandkids. 1 granddog and 1 grandcat.       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       Hobbies: walking 2.5 miles  Aday, stays active    Objective: BP 102/78 (BP Location: Left Arm, Patient Position: Sitting, Cuff Size: Normal)   Pulse 71   Temp 98.3 F (36.8 C) (Oral)   Ht 5\' 7"  (1.702 m)   Wt 153 lb 9.6 oz (69.7 kg)   SpO2 98%   BMI 24.06 kg/m  Gen: NAD, resting comfortably HEENT: Mucous membranes are moist. Oropharynx normal Neck: no thyromegaly CV: RRR no murmurs rubs or gallops Lungs: CTAB no crackles, wheeze, rhonchi Abdomen: soft/nontender/nondistended/normal bowel sounds. No rebound or guarding.  Ext: no edema Skin: warm, dry Neuro: grossly normal, moves all extremities, PERRLA  Assessment/Plan:  67 y.o. female presenting for annual physical.  Health Maintenance counseling: 1. Anticipatory guidance: Patient counseled regarding regular dental exams -q6 months, eye exams - yearly, wearing seatbelts.  2. Risk factor reduction:  Advised patient of need for regular exercise and diet rich and fruits and vegetables to reduce risk of heart attack and stroke. Exercise- 2.5 miles about 6 days a week even after hip surgery- thinks left will have to be done at some point. Diet-weight up a few lbs from last year but still healthy weight noted.  Wt Readings from Last 3 Encounters:  05/20/18 153 lb 9.6 oz (69.7 kg)  05/11/18 155 lb (70.3 kg)  02/16/18 155 lb (70.3 kg)  3. Immunizations/screenings/ancillary studies- discussed shingrix availability issues  Immunization History  Administered Date(s) Administered  . Influenza Whole 09/19/2007  . Influenza,inj,Quad PF,6+ Mos 09/26/2013, 08/30/2014, 09/04/2015  . Td 05/12/2004  . Tdap 04/22/2016  . Zoster 11/14/2014  4. Cervical cancer screening- follows with Dr. Benjie Karvonen yearly. Should be due for 3 year repeat pap this year in  november 5. Breast cancer screening-  breast exam with Dr. Benjie Karvonen and mammogram 11/25/17 6. Colon cancer screening - 01/08/14 with 10 year follow up 7. Skin cancer screening- Sees Dr. Delman Cheadle every 6 months. advised regular sunscreen use. Denies worrisome, changing, or new skin lesions.  8. Birth control/STD check- postmenopausal and monogamous  9. Osteoporosis screening at 72- see osteopenia discussion below  Status of chronic or acute concerns   Popliteal artery occlusion on right on chronic aspirin at this point. Prior had been on coumadin. Seeing Dr. Bridgett Larsson yearly . She will probably be seeing his replacement- he is moving in august to [place]. She wants to stay on q6  month schedule for now- has been offered yearly.   Seeing chiropractor for pelvic tilt in past- hasnt needed to since surgery  BPPV in past- no recent issues  Wants to trial turmeric- she may trial 500mg  daily  Osteopenia of left femoral neck Osteopenia of left femoral neck-  ordered dexa last year- not done yet as was focusing on hip surgery. Last scan 2015- order dexa again today. Taking vitamin D- works on getting calcium through food.   If needed fosamax would have to watch reflux and may have to change to injectables  Hyperlipidemia Hyperlipidemia- update lipids-<5%10 year risk last year. On aspirin for primary prevention and popliteal artery issue. I told her with popliteal artery issues I would recommend 10 % cut off to start statin particularly since she already eats well and exercises. She seems to be be open to this idea.   GERD GERD- on prilosec 20mg  BID last year, trial daily didn't work in past-  remains twice a day.    THROMBOCYTOPENIA, CHRONIC Thrombocytopenia for years- has seen hematology in past with no further work up suggested. Monitor cbc today   Vitamin D deficiency Vitamin D- on 2000 units a day. Update vitamin D. Was ok last year    Return in about 1 year (around 05/21/2019) for welcome to  medicare.  Lab/Order associations: High risk medication use - Plan: Vitamin B12  Osteopenia of left femoral neck - Plan: DG Bone Density  Hyperlipidemia, unspecified hyperlipidemia type - Plan: CBC, Lipid panel, Comprehensive metabolic panel  Gastroesophageal reflux disease without esophagitis - Plan: Vitamin B12  THROMBOCYTOPENIA, CHRONIC - Plan: CBC  Vitamin D deficiency - Plan: VITAMIN D 25 Hydroxy (Vit-D Deficiency, Fractures)  Return precautions advised.  Garret Reddish, MD

## 2018-05-20 NOTE — Progress Notes (Signed)
Continue vitamin D. You still have osteopenia and we should repeat in 2-3 years. Your bone density in your spine worsened but was still not at osteoporosis range (just a hair below). This area has worsened but your fracture risk is still below the thresholds for starting medicine like fosamax

## 2018-05-20 NOTE — Assessment & Plan Note (Signed)
Hyperlipidemia- update lipids-<5%10 year risk last year. On aspirin for primary prevention and popliteal artery issue. I told her with popliteal artery issues I would recommend 10 % cut off to start statin particularly since she already eats well and exercises. She seems to be be open to this idea.

## 2018-09-09 DIAGNOSIS — Z23 Encounter for immunization: Secondary | ICD-10-CM | POA: Diagnosis not present

## 2018-09-21 ENCOUNTER — Ambulatory Visit: Payer: BLUE CROSS/BLUE SHIELD

## 2018-11-08 ENCOUNTER — Ambulatory Visit: Payer: BLUE CROSS/BLUE SHIELD | Admitting: Family

## 2018-11-08 ENCOUNTER — Encounter (HOSPITAL_COMMUNITY): Payer: BLUE CROSS/BLUE SHIELD

## 2018-11-17 ENCOUNTER — Other Ambulatory Visit: Payer: Self-pay

## 2018-11-17 DIAGNOSIS — I739 Peripheral vascular disease, unspecified: Secondary | ICD-10-CM

## 2018-11-17 DIAGNOSIS — I70201 Unspecified atherosclerosis of native arteries of extremities, right leg: Secondary | ICD-10-CM

## 2018-11-17 DIAGNOSIS — Z95828 Presence of other vascular implants and grafts: Secondary | ICD-10-CM

## 2018-11-18 DIAGNOSIS — L290 Pruritus ani: Secondary | ICD-10-CM | POA: Diagnosis not present

## 2018-11-18 DIAGNOSIS — Z01419 Encounter for gynecological examination (general) (routine) without abnormal findings: Secondary | ICD-10-CM | POA: Diagnosis not present

## 2018-11-18 DIAGNOSIS — N952 Postmenopausal atrophic vaginitis: Secondary | ICD-10-CM | POA: Diagnosis not present

## 2018-11-18 DIAGNOSIS — Z124 Encounter for screening for malignant neoplasm of cervix: Secondary | ICD-10-CM | POA: Diagnosis not present

## 2018-11-18 LAB — HM PAP SMEAR: HM Pap smear: NEGATIVE

## 2018-11-28 DIAGNOSIS — Z1231 Encounter for screening mammogram for malignant neoplasm of breast: Secondary | ICD-10-CM | POA: Diagnosis not present

## 2018-11-28 LAB — HM MAMMOGRAPHY

## 2018-12-07 DIAGNOSIS — K6289 Other specified diseases of anus and rectum: Secondary | ICD-10-CM | POA: Diagnosis not present

## 2018-12-09 ENCOUNTER — Other Ambulatory Visit: Payer: Self-pay

## 2018-12-09 ENCOUNTER — Ambulatory Visit (INDEPENDENT_AMBULATORY_CARE_PROVIDER_SITE_OTHER)
Admission: RE | Admit: 2018-12-09 | Discharge: 2018-12-09 | Disposition: A | Discharge status: Home / Self Care | Payer: Medicare HMO | Source: Ambulatory Visit | Attending: Vascular Surgery | Admitting: Vascular Surgery

## 2018-12-09 ENCOUNTER — Encounter: Payer: Self-pay | Admitting: *Deleted

## 2018-12-09 ENCOUNTER — Other Ambulatory Visit: Payer: Self-pay | Admitting: *Deleted

## 2018-12-09 ENCOUNTER — Ambulatory Visit (HOSPITAL_COMMUNITY)
Admission: RE | Admit: 2018-12-09 | Discharge: 2018-12-09 | Disposition: A | Discharge status: Home / Self Care | Payer: Medicare HMO | Source: Ambulatory Visit | Attending: Vascular Surgery | Admitting: Vascular Surgery

## 2018-12-09 ENCOUNTER — Encounter: Payer: Self-pay | Admitting: Vascular Surgery

## 2018-12-09 ENCOUNTER — Ambulatory Visit (INDEPENDENT_AMBULATORY_CARE_PROVIDER_SITE_OTHER): Payer: Medicare HMO | Admitting: Vascular Surgery

## 2018-12-09 VITALS — BP 99/74 | HR 66 | Temp 97.5°F | Resp 20 | Ht 67.0 in | Wt 153.0 lb

## 2018-12-09 DIAGNOSIS — Z95828 Presence of other vascular implants and grafts: Secondary | ICD-10-CM | POA: Insufficient documentation

## 2018-12-09 DIAGNOSIS — I70201 Unspecified atherosclerosis of native arteries of extremities, right leg: Secondary | ICD-10-CM

## 2018-12-09 DIAGNOSIS — I739 Peripheral vascular disease, unspecified: Secondary | ICD-10-CM

## 2018-12-09 NOTE — Progress Notes (Signed)
Patient ID: Elizabeth Douglas, female   DOB: [birth year], 68 y.o.   MRN: [id number]  Reason for Consult: Follow-up (ABI/LE Art Duplex)   Referred by Marin Olp, MD  Subjective:     HPI:  Elizabeth Douglas is a 68 y.o. female with a history of right popliteal above-knee to below-knee bypass with vein.  This was done for subacute thrombosis.  She also underwent thrombectomy at that time.  She has done well without residual pain.  She did take Xarelto at the time of her recent right hip replacement.  She is planning for a left hip replacement.  Currently maintained on aspirin.  Does not have tissue loss or wounds.  Not have any complaints related to today's visit.  Past Medical History:  Diagnosis Date  . Anal fissure   . Arthritis    hip  . Atypical chest pain   . Basal cell carcinoma   . Complication of anesthesia    BP drops and passes out  [place] vomiting  . Excess or deficiency of vitamin D   . Gallbladder polyp   . GERD (gastroesophageal reflux disease)   . Hemorrhoids   . Microhematuria    pt. denies  . Osteopenia   . PONV (postoperative nausea and vomiting)   . Thrombocytopenia ([place])    Family History  Problem Relation Age of Onset  . COPD Mother   . Osteoporosis Mother   . Prostate cancer Father   . Heart disease Brother 37       Before age 53  . Heart attack Brother 42       Massive  . Lymphoma Brother        non and hodgkins  . Coronary artery disease Brother   . Colon cancer Neg Hx   . Colon polyps Neg Hx   . Rectal cancer Neg Hx   . Stomach cancer Neg Hx    Past Surgical History:  Procedure Laterality Date  . COLONOSCOPY  2005, 2015  . INTRAOPERATIVE ARTERIOGRAM Right 11/29/2015   Procedure: INTRA OPERATIVE ARTERIOGRAM;  Surgeon: Conrad [place], MD;  Location: [place];  Service: Vascular;  Laterality: Right;  . JOINT REPLACEMENT     right total hip Aluisio 02-16-18  . LUNG BIOPSY    . THROMBECTOMY FEMORAL ARTERY Right 11/29/2015   Procedure:  RIGHT   POPLITEAL to anterior tibial and posterior tibial ARTERY thrombectomy. Right above knee to below knee popliteal artery bypass;  Surgeon: Conrad [place]ford, MD;  Location: [place];  Service: Vascular;  Laterality: Right;  . TOTAL HIP ARTHROPLASTY Right 02/16/2018   Procedure: RIGHT TOTAL HIP ARTHROPLASTY ANTERIOR APPROACH;  Surgeon: Gaynelle Arabian, MD;  Location[facility];  Service: Orthopedics;  Laterality: Right;  . UPPER GASTROINTESTINAL ENDOSCOPY  2009    Short Social History:  Social History   Tobacco Use  . Smoking status: Never Smoker  . Smokeless tobacco: Never Used  Substance Use Topics  . Alcohol use: No    Alcohol/week: 0.0 standard drinks    Allergies  Allergen Reactions  . Fish Allergy Swelling    Pt felt like throat was closing last time she ate     Current Outpatient Medications  Medication Sig Dispense Refill  . acetaminophen (TYLENOL) 500 MG tablet Take 1,000 mg by mouth at bedtime as needed for moderate pain.     Marland Kitchen aspirin (ASPIRIN 81) 81 MG chewable tablet Chew by mouth daily.    . Carboxymethylcellul-Glycerin (LUBRICATING EYE DROPS OP) Place  1 drop into both eyes daily.    . Cholecalciferol (VITAMIN D3) 25 MCG (1000 UT) CAPS Vitamin D3 25 mcg (1,000 unit) capsule  Take by oral route.    . conjugated estrogens (PREMARIN) vaginal cream Premarin 0.625 mg/gram vaginal cream    . omeprazole (PRILOSEC) 20 MG capsule Take 1 capsule (20 mg total) by mouth 2 (two) times daily. 180 capsule 3  . Simethicone (ANTI-GAS/80 PO) Take by mouth at bedtime.    . tretinoin (RETIN-A) 0.05 % cream Apply 1 application topically at bedtime.     . Wheat Dextrin (BENEFIBER) POWD Take 1 Dose by mouth daily.      No current facility-administered medications for this visit.     Review of Systems  Constitutional:  Constitutional negative. HENT: HENT negative.  Eyes: Eyes negative.  Respiratory: Respiratory negative.  Cardiovascular: Cardiovascular negative.  GI: Gastrointestinal negative.    Musculoskeletal: Positive for joint pain.  Neurological: Neurological negative. Hematologic: Hematologic/lymphatic negative.  Psychiatric: Psychiatric negative.        Objective:  Objective   Vitals:   12/09/18 1056  BP: 99/74  Pulse: 66  Resp: 20  Temp: (!) 97.5 F (36.4 C)  SpO2: 100%  Weight: 153 lb (69.4 kg)  Height: 5\' 7"  (1.702 m)   Body mass index is 23.96 kg/m.  Physical Exam HENT:     Mouth/Throat:     Mouth: Mucous membranes are moist.  Eyes:     Pupils: Pupils are equal, round, and reactive to light.  Neck:     Musculoskeletal: Neck supple.  Cardiovascular:     Rate and Rhythm: Normal rate.     Pulses:          Radial pulses are 2+ on the right side and 2+ on the left side.       Popliteal pulses are 2+ on the right side and 2+ on the left side.       Dorsalis pedis pulses are 2+ on the right side and 2+ on the left side.       Posterior tibial pulses are 2+ on the left side.  Pulmonary:     Effort: Pulmonary effort is normal.  Abdominal:     General: Abdomen is flat.     Palpations: Abdomen is soft.  Musculoskeletal: Normal range of motion.        General: No swelling.  Skin:    General: Skin is warm and dry.     Capillary Refill: Capillary refill takes less than 2 seconds.  Neurological:     General: No focal deficit present.     Mental Status: She is alert.  Psychiatric:        Mood and Affect: Mood normal.        Behavior: Behavior normal.        Thought Content: Thought content normal.        Judgment: Judgment normal.     Data: I have independently interpreted her lower extremity duplex and ABIs which demonstrates ABIs 1.2 bilaterally and on the right side there is a monophasic area in the mid graft with peak systolic velocity of 19 consistent with impending graft failure.     Assessment/Plan:     68 year old female with history of right above-knee to below-knee popliteal artery bypass with vein for subacute thrombosis also  requiring thrombectomy of her tibial vessels in the right lower extremity with normal blood vessels on the left.  ABIs are preserved with palpable distal pulses today.  Unfortunately  there is a decreased velocity in the mid graft consistent with likely stenosis in the either the inflow or outflow and impending graft failure.  I discussed this with her and recommended either angiogram possible intervention versus short interval follow-up with duplex.  This time she will undergo angiogram from a left common femoral approach to intervene on her right lower extremity bypass vein graft.  I discussed the risk and benefits and alternatives and she agrees to proceed we will get her scheduled today.     Waynetta Sandy MD [facility]

## 2018-12-11 ENCOUNTER — Emergency Department (HOSPITAL_BASED_OUTPATIENT_CLINIC_OR_DEPARTMENT_OTHER): Payer: Medicare HMO

## 2018-12-11 ENCOUNTER — Emergency Department (HOSPITAL_COMMUNITY)
Admission: EM | Admit: 2018-12-11 | Discharge: 2018-12-11 | Disposition: A | Discharge status: Home / Self Care | Payer: Medicare HMO | Attending: Vascular Surgery | Admitting: Vascular Surgery

## 2018-12-11 ENCOUNTER — Encounter (HOSPITAL_COMMUNITY): Payer: Self-pay

## 2018-12-11 DIAGNOSIS — I739 Peripheral vascular disease, unspecified: Secondary | ICD-10-CM

## 2018-12-11 DIAGNOSIS — R200 Anesthesia of skin: Secondary | ICD-10-CM | POA: Diagnosis present

## 2018-12-11 DIAGNOSIS — M79671 Pain in right foot: Secondary | ICD-10-CM | POA: Diagnosis not present

## 2018-12-11 DIAGNOSIS — R42 Dizziness and giddiness: Secondary | ICD-10-CM | POA: Diagnosis not present

## 2018-12-11 NOTE — Discharge Instructions (Addendum)
Follow-up with your vascular doctor as planned.

## 2018-12-11 NOTE — Consult Note (Signed)
Hospital Consult    Reason for Consult: Right foot numbness in setting of remote above-knee to below-knee right popliteal bypass  MRN #:  [id number]  History of Present Illness: This is a 68 y.o. female who previously underwent right above-knee to below-knee popliteal bypass with ipsilateral saphenous vein as well as right lower extremity thrombectomy for subacute thrombosis of the right popliteal artery in 2016 by Dr. Bridgett Larsson.  She presents to the ED today with acute onset numbness in her right foot that she noticed last night.  She stated that it felt the same as when her initial presentation in 2016.  Numbness was worse when she walked.  Only on the plantar surface of her foot.  She was seen by Dr. Donzetta Matters on Friday in clinic and had noninvasive imaging that showed normal ABIs but concern for impending graft failure with a low velocity in the graft (triphasic waveform but velocity of 19 in midgraft).  She is scheduled for arteriogram next week with possible intervention with Dr. Donzetta Matters.  She denies any motor issues.  States she is on a full dose aspirin at this time.  Past Medical History:  Diagnosis Date  . Anal fissure   . Arthritis    hip  . Atypical chest pain   . Basal cell carcinoma   . Complication of anesthesia    BP drops and passes out  [place] vomiting  . Excess or deficiency of vitamin D   . Gallbladder polyp   . GERD (gastroesophageal reflux disease)   . Hemorrhoids   . Microhematuria    pt. denies  . Osteopenia   . PONV (postoperative nausea and vomiting)   . Thrombocytopenia ([place])     Past Surgical History:  Procedure Laterality Date  . COLONOSCOPY  2005, 2015  . INTRAOPERATIVE ARTERIOGRAM Right 11/29/2015   Procedure: INTRA OPERATIVE ARTERIOGRAM;  Surgeon: Conrad Tippecanoe, MD;  Location: [facility];  Service: Vascular;  Laterality: Right;  . JOINT REPLACEMENT     right total hip [place] 02-16-18  . LUNG BIOPSY    . THROMBECTOMY FEMORAL ARTERY Right 11/29/2015   Procedure:   RIGHT  POPLITEAL to anterior tibial and posterior tibial ARTERY thrombectomy. Right above knee to below knee popliteal artery bypass;  Surgeon: Conrad [place], MD;  Location: [facility];  Service: Vascular;  Laterality: Right;  . TOTAL HIP ARTHROPLASTY Right 02/16/2018   Procedure: RIGHT TOTAL HIP ARTHROPLASTY ANTERIOR APPROACH;  Surgeon: Gaynelle Arabian, MD;  Location: [facility];  Service: Orthopedics;  Laterality: Right;  . UPPER GASTROINTESTINAL ENDOSCOPY  2009    Allergies  Allergen Reactions  . Fish Allergy Swelling    Pt felt like throat was closing last time she ate     Prior to Admission medications   Medication Sig Start Date End Date Taking? Authorizing Provider  acetaminophen (TYLENOL) 500 MG tablet Take 1,000 mg by mouth at bedtime as needed for moderate pain.    Yes [provider]  aspirin (ASPIRIN 81) 81 MG chewable tablet Chew by mouth daily.   Yes [provider]  Carboxymethylcellul-Glycerin (LUBRICATING EYE DROPS OP) Place 1 drop into both eyes daily.   Yes [provider]  Cholecalciferol (VITAMIN D3) 25 MCG (1000 UT) CAPS Vitamin D3 25 mcg (1,000 unit) capsule  Take by oral route.   Yes [provider]  conjugated estrogens (PREMARIN) vaginal cream Premarin 0.625 mg/gram vaginal cream   Yes [provider]  omeprazole (PRILOSEC) 20 MG capsule Take 1 capsule (20 mg  total) by mouth 2 (two) times daily. Patient taking differently: Take 20 mg by mouth daily.  03/21/18  Yes Marin Olp, MD  Wheat Dextrin (BENEFIBER) POWD Take 1 Dose by mouth daily.    Yes [provider]  Simethicone (ANTI-GAS/80 PO) Take by mouth at bedtime.    [provider]  tretinoin (RETIN-A) 0.05 % cream Apply 1 application topically at bedtime.     [provider]    Social History   Socioeconomic History  . Marital status: Married    Spouse name: Not on file  . Number of children: 1  . Years of education: Not on file  . Highest  education level: Not on file  Occupational History  . Occupation: Retired  Scientific laboratory technician  . Financial resource strain: Not on file  . Food insecurity:    Worry: Not on file    Inability: Not on file  . Transportation needs:    Medical: Not on file    Non-medical: Not on file  Tobacco Use  . Smoking status: Never Smoker  . Smokeless tobacco: Never Used  Substance and Sexual Activity  . Alcohol use: No    Alcohol/week: 0.0 standard drinks  . Drug use: No  . Sexual activity: Yes  Lifestyle  . Physical activity:    Days per week: Not on file    Minutes per session: Not on file  . Stress: Not on file  Relationships  . Social connections:    Talks on phone: Not on file    Gets together: Not on file    Attends religious service: Not on file    Active member of club or organization: Not on file    Attends meetings of clubs or organizations: Not on file    Relationship status: Not on file  . Intimate partner violence:    Fear of current or ex partner: Not on file    Emotionally abused: Not on file    Physically abused: Not on file    Forced sexual activity: Not on file  Other Topics Concern  . Not on file  Social History Narrative   Married. 1 daughter. No grandkids. 1 granddog and 1 grandcat.       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       Hobbies: walking 2.5 miles  Aday, stays active     Family History  Problem Relation Age of Onset  . COPD Mother   . Osteoporosis Mother   . Prostate cancer Father   . Heart disease Brother 75       Before age 65  . Heart attack Brother 42       Massive  . Lymphoma Brother        non and hodgkins  . Coronary artery disease Brother   . Colon cancer Neg Hx   . Colon polyps Neg Hx   . Rectal cancer Neg Hx   . Stomach cancer Neg Hx     ROS: [x]  Positive   [ ]  Negative   [ ]  All sytems reviewed and are negative  Cardiovascular: []  chest pain/pressure []   palpitations []  SOB lying flat []  DOE []  pain in legs while walking []  pain in legs at rest []  pain in legs at night []  non-healing ulcers []  hx of DVT []  swelling in legs  Pulmonary: []  productive cough []  asthma/wheezing []  home O2  Neurologic: []  weakness in []  arms []   legs [x]  numbness in []  arms [x]  legs (plantar surface of right foot) []  hx of CVA []  mini stroke [] difficulty speaking or slurred speech []  temporary loss of vision in one eye []  dizziness  Hematologic: []  hx of cancer []  bleeding problems []  problems with blood clotting easily  Endocrine:   []  diabetes []  thyroid disease  GI []  vomiting blood []  blood in stool  GU: []  CKD/renal failure []  HD--[]  M/W/F or []  T/T/S []  burning with urination []  blood in urine  Psychiatric: []  anxiety []  depression  Musculoskeletal: []  arthritis []  joint pain  Integumentary: []  rashes []  ulcers  Constitutional: []  fever []  chills   Physical Examination  Vitals:   12/11/18 1300 12/11/18 1315  BP: 115/81 125/79  Pulse: 70 70  Resp: 18 17  Temp:    SpO2: 97% 99%   There is no height or weight on file to calculate BMI.  General:  NAD Gait: Not observed HENT: WNL, normocephalic Pulmonary: normal non-labored breathing Cardiac: regular Abdomen: soft, NT/ND, no masses Vascular Exam/Pulses:  Right Left  Radial 2+ (normal) 2+ (normal)  Ulnar    Femoral 2+ (normal) 2+ (normal)  Popliteal 2+ (normal) 2+ (normal)  DP 2+ (normal) 2+ (normal)  PT  2+ (normal)   Extremities: without ischemic changes, without Gangrene , without cellulitis; without open wounds;  Musculoskeletal: no muscle wasting or atrophy  Neurologic: A&O X 3; Appropriate Affect ; MOTOR FUNCTION:  moving all extremities equally. Speech is fluent/normal   CBC    Component Value Date/Time   WBC 4.3 05/20/2018 0943   RBC 4.50 05/20/2018 0943   HGB 14.4 05/20/2018 0943   HGB 14.4 12/08/2006 1042   HCT 41.7 05/20/2018 0943    HCT 41.7 12/08/2006 1042   PLT 181.0 05/20/2018 0943   PLT 170 12/08/2006 1042   MCV 92.7 05/20/2018 0943   MCV 94.0 12/08/2006 1042   MCH 31.4 02/18/2018 0553   MCHC 34.5 05/20/2018 0943   RDW 13.2 05/20/2018 0943   RDW 12.5 12/08/2006 1042   LYMPHSABS 1.7 04/15/2016 0834   LYMPHSABS 1.5 12/08/2006 1042   MONOABS 0.3 04/15/2016 0834   MONOABS 0.2 12/08/2006 1042   EOSABS 0.1 04/15/2016 0834   EOSABS 0.1 12/08/2006 1042   BASOSABS 0.0 04/15/2016 0834   BASOSABS 0.0 12/08/2006 1042    BMET    Component Value Date/Time   NA 141 05/20/2018 0943   NA 144 10/16/2015   K 4.7 05/20/2018 0943   CL 105 05/20/2018 0943   CO2 31 05/20/2018 0943   GLUCOSE 95 05/20/2018 0943   GLUCOSE 79 11/19/2006 1355   BUN 11 05/20/2018 0943   BUN 12 10/16/2015   CREATININE 0.69 05/20/2018 0943   CALCIUM 9.6 05/20/2018 0943   GFRNONAA >60 02/18/2018 0553   GFRAA >60 02/18/2018 0553    COAGS: Lab Results  Component Value Date   INR 0.98 02/08/2018   INR 1.9 02/03/2016   INR 2.3 01/13/2016     Non-Invasive Vascular Imaging:     RLE arterial duplex: AK to BK pop bypass is patent with low velocity in mid graft ~ 20, triphasic runoff at ankle  ASSESSMENT/PLAN: This is a 68 y.o. female that presented for evaluation of numbness in the right foot in setting of remote AK to BK pop bypass.  On exam she has a palpable right DP pulse.  Her foot is warm with no overt signs of ischemia.  Her duplex demonstrates that her AK pop to BK pop  bypass is patent and with a decreased velocity in midgraft as noted in clinic Friday and same concern for impending graft failure.  I think she can be discharged home given no signs of ischemia on exam and keep plan for right leg arteriogram and possible intervention by Dr. Donzetta Matters next week.  Discussed plan of care with patient and husband.  Advised to contact us if symptoms changed.  Showed husband how to examine foot for palpable pulse.     Marty Heck,  MD [facility] Office: [phone] Pager: [phone]

## 2018-12-11 NOTE — Progress Notes (Signed)
VASCULAR LAB PRELIMINARY  PRELIMINARY  PRELIMINARY  PRELIMINARY  Right duplex of popliteal bypass completed.    Preliminary report:  Bypass graft appears patent.  There is an area of diminished flow noted distal thigh to knee with velocities diminishing 50%.  Elliyah Liszewski, RVT 12/11/2018, 1:58 PM

## 2018-12-11 NOTE — ED Triage Notes (Signed)
Pt presents to ED for eval of right leg numbness following a graft placement. Pt able to ambulate and denies pain. Pt MD at bedside- states he can palpate pulses but is concerned with the numbness. Pt skin warm and dry, skin tone appropriate.

## 2018-12-11 NOTE — ED Provider Notes (Signed)
Dr. Carlis Abbott asked that I discharge the patient after he reviewed her arterial imaging, and talked to the patient.  I saw her in the ED.  I did not evaluate this patient.  The PA did not see the patient.  She was seen primarily by vascular surgery.   Daleen Bo, MD 12/11/18 [phone]

## 2018-12-11 NOTE — ED Notes (Signed)
Pt transported to vascular.  °

## 2018-12-12 DIAGNOSIS — R69 Illness, unspecified: Secondary | ICD-10-CM | POA: Diagnosis not present

## 2018-12-22 ENCOUNTER — Encounter (HOSPITAL_COMMUNITY)
Admission: RE | Disposition: A | Discharge status: Home / Self Care | Payer: Self-pay | Source: Home / Self Care | Attending: Vascular Surgery

## 2018-12-22 ENCOUNTER — Encounter (HOSPITAL_COMMUNITY): Payer: Self-pay | Admitting: Vascular Surgery

## 2018-12-22 ENCOUNTER — Other Ambulatory Visit: Payer: Self-pay

## 2018-12-22 ENCOUNTER — Ambulatory Visit (HOSPITAL_COMMUNITY)
Admission: RE | Admit: 2018-12-22 | Discharge: 2018-12-22 | Disposition: A | Discharge status: Home / Self Care | Payer: Medicare HMO | Attending: Vascular Surgery | Admitting: Vascular Surgery

## 2018-12-22 ENCOUNTER — Telehealth: Payer: Self-pay | Admitting: Vascular Surgery

## 2018-12-22 DIAGNOSIS — Y832 Surgical operation with anastomosis, bypass or graft as the cause of abnormal reaction of the patient, or of later complication, without mention of misadventure at the time of the procedure: Secondary | ICD-10-CM | POA: Diagnosis not present

## 2018-12-22 DIAGNOSIS — M79671 Pain in right foot: Secondary | ICD-10-CM | POA: Diagnosis not present

## 2018-12-22 DIAGNOSIS — T82898A Other specified complication of vascular prosthetic devices, implants and grafts, initial encounter: Secondary | ICD-10-CM | POA: Insufficient documentation

## 2018-12-22 HISTORY — PX: ABDOMINAL AORTOGRAM W/LOWER EXTREMITY: CATH118223

## 2018-12-22 LAB — POCT I-STAT 4, (NA,K, GLUC, HGB,HCT)
Glucose, Bld: 82 mg/dL (ref 70–99)
HCT: 40 % (ref 36.0–46.0)
HEMOGLOBIN: 13.6 g/dL (ref 12.0–15.0)
Potassium: 4 mmol/L (ref 3.5–5.1)
Sodium: 142 mmol/L (ref 135–145)

## 2018-12-22 LAB — POCT I-STAT CREATININE: CREATININE: 0.7 mg/dL (ref 0.44–1.00)

## 2018-12-22 SURGERY — ABDOMINAL AORTOGRAM W/LOWER EXTREMITY
Anesthesia: LOCAL | Laterality: Right

## 2018-12-22 MED ORDER — MIDAZOLAM HCL 2 MG/2ML IJ SOLN
INTRAMUSCULAR | Status: AC
  Filled 2018-12-22: qty 2

## 2018-12-22 MED ORDER — DIPHENHYDRAMINE HCL 50 MG/ML IJ SOLN
25.0000 mg | INTRAMUSCULAR | Status: AC
  Administered 2018-12-22: 25 mg via INTRAVENOUS

## 2018-12-22 MED ORDER — SODIUM CHLORIDE 0.9 % IV SOLN
INTRAVENOUS | Status: DC
  Administered 2018-12-22: 10:00:00 via INTRAVENOUS

## 2018-12-22 MED ORDER — HEPARIN (PORCINE) IN NACL 1000-0.9 UT/500ML-% IV SOLN
INTRAVENOUS | Status: AC
  Filled 2018-12-22: qty 1000

## 2018-12-22 MED ORDER — METHYLPREDNISOLONE SODIUM SUCC 125 MG IJ SOLR
125.0000 mg | INTRAMUSCULAR | Status: AC
  Administered 2018-12-22: 125 mg via INTRAVENOUS

## 2018-12-22 MED ORDER — LIDOCAINE HCL (PF) 1 % IJ SOLN
INTRAMUSCULAR | Status: AC
  Filled 2018-12-22: qty 30

## 2018-12-22 MED ORDER — HEPARIN (PORCINE) IN NACL 1000-0.9 UT/500ML-% IV SOLN
INTRAVENOUS | Status: DC | PRN
  Administered 2018-12-22 (×2): 500 mL

## 2018-12-22 MED ORDER — LIDOCAINE HCL (PF) 1 % IJ SOLN
INTRAMUSCULAR | Status: DC | PRN
  Administered 2018-12-22: 12 mL

## 2018-12-22 MED ORDER — METHYLPREDNISOLONE SODIUM SUCC 125 MG IJ SOLR
INTRAMUSCULAR | Status: AC
  Filled 2018-12-22: qty 2

## 2018-12-22 MED ORDER — MIDAZOLAM HCL 2 MG/2ML IJ SOLN
INTRAMUSCULAR | Status: DC | PRN
  Administered 2018-12-22: 1 mg via INTRAVENOUS

## 2018-12-22 MED ORDER — IODIXANOL 320 MG/ML IV SOLN
INTRAVENOUS | Status: DC | PRN
  Administered 2018-12-22: 60 mL via INTRAVENOUS

## 2018-12-22 MED ORDER — MORPHINE SULFATE (PF) 10 MG/ML IV SOLN: 2.0000 mg | INTRAVENOUS | Status: DC | PRN

## 2018-12-22 MED ORDER — DIPHENHYDRAMINE HCL 50 MG/ML IJ SOLN
INTRAMUSCULAR | Status: AC
  Filled 2018-12-22: qty 1

## 2018-12-22 MED ORDER — FENTANYL CITRATE (PF) 100 MCG/2ML IJ SOLN
INTRAMUSCULAR | Status: AC
  Filled 2018-12-22: qty 2

## 2018-12-22 MED ORDER — FAMOTIDINE IN NACL 20-0.9 MG/50ML-% IV SOLN
20.0000 mg | INTRAVENOUS | Status: AC
  Administered 2018-12-22: 20 mg via INTRAVENOUS
  Filled 2018-12-22: qty 50

## 2018-12-22 MED ORDER — FENTANYL CITRATE (PF) 100 MCG/2ML IJ SOLN
INTRAMUSCULAR | Status: DC | PRN
  Administered 2018-12-22: 50 ug via INTRAVENOUS

## 2018-12-22 SURGICAL SUPPLY — 10 items
CATH OMNI FLUSH 5F 65CM (CATHETERS) ×1 IMPLANT
CLOSURE MYNX CONTROL 5F (Vascular Products) ×1 IMPLANT
KIT MICROPUNCTURE NIT STIFF (SHEATH) ×1 IMPLANT
KIT PV (KITS) ×2 IMPLANT
SHEATH PINNACLE 5F 10CM (SHEATH) ×1 IMPLANT
SHEATH PROBE COVER 6X72 (BAG) ×1 IMPLANT
SYR MEDRAD MARK V 150ML (SYRINGE) ×1 IMPLANT
TRANSDUCER W/STOPCOCK (MISCELLANEOUS) ×2 IMPLANT
TRAY PV CATH (CUSTOM PROCEDURE TRAY) ×2 IMPLANT
WIRE BENTSON .035X145CM (WIRE) ×1 IMPLANT

## 2018-12-22 NOTE — H&P (Signed)
   History and Physical Update  The patient was interviewed and re-examined.  The patient's previous History and Physical has been reviewed and is unchanged from recent office visit. Plan for right lower extremity angiogram with possible intervention.   Sahana Boyland C. Donzetta Matters, MD [facility] Office: [phone] Pager: [phone]  12/22/2018, 10:20 AM

## 2018-12-22 NOTE — Discharge Instructions (Signed)
Femoral Site Care °This sheet gives you information about how to care for yourself after your procedure. Your health care provider may also give you more specific instructions. If you have problems or questions, contact your health care provider. °What can I expect after the procedure? °After the procedure, it is common to have: °· Bruising that usually fades within 1-2 weeks. °· Tenderness at the site. °Follow these instructions at home: °Wound care °· Follow instructions from your health care provider about how to take care of your insertion site. Make sure you: °? Wash your hands with soap and water before you change your bandage (dressing). If soap and water are not available, use hand sanitizer. °? Change your dressing as told by your health care provider. °? Leave stitches (sutures), skin glue, or adhesive strips in place. These skin closures may need to stay in place for 2 weeks or longer. If adhesive strip edges start to loosen and curl up, you may trim the loose edges. Do not remove adhesive strips completely unless your health care provider tells you to do that. °· Do not take baths, swim, or use a hot tub until your health care provider approves. °· You may shower 24-48 hours after the procedure or as told by your health care provider. °? Gently wash the site with plain soap and water. °? Pat the area dry with a clean towel. °? Do not rub the site. This may cause bleeding. °· Do not apply powder or lotion to the site. Keep the site clean and dry. °· Check your femoral site every day for signs of infection. Check for: °? Redness, swelling, or pain. °? Fluid or blood. °? Warmth. °? Pus or a bad smell. °Activity °· For the first 2-3 days after your procedure, or as long as directed: °? Avoid climbing stairs as much as possible. °? Do not squat. °· Do not lift anything that is heavier than 10 lb (4.5 kg), or the limit that you are told, until your health care provider says that it is safe. °· Rest as  directed. °? Avoid sitting for a long time without moving. Get up to take short walks every 1-2 hours. °· Do not drive for 24 hours if you were given a medicine to help you relax (sedative). °General instructions °· Take over-the-counter and prescription medicines only as told by your health care provider. °· Keep all follow-up visits as told by your health care provider. This is important. °Contact a health care provider if you have: °· A fever or chills. °· You have redness, swelling, or pain around your insertion site. °Get help right away if: °· The catheter insertion area swells very fast. °· You pass out. °· You suddenly start to sweat or your skin gets clammy. °· The catheter insertion area is bleeding, and the bleeding does not stop when you hold steady pressure on the area. °· The area near or just beyond the catheter insertion site becomes pale, cool, tingly, or numb. °These symptoms may represent a serious problem that is an emergency. Do not wait to see if the symptoms will go away. Get medical help right away. Call your local emergency services (911 in the [place] Do not drive yourself to the hospital. °Summary °· After the procedure, it is common to have bruising that usually fades within 1-2 weeks. °· Check your femoral site every day for signs of infection. °· Do not lift anything that is heavier than 10 lb (4.5 kg), or the   limit that you are told, until your health care provider says that it is safe. °This information is not intended to replace advice given to you by your health care provider. Make sure you discuss any questions you have with your health care provider. °Document Released: 07/20/2014 Document Revised: 11/29/2017 Document Reviewed: 11/29/2017 °Elsevier Interactive Patient Education © 2019 [employer] ° °

## 2018-12-22 NOTE — Progress Notes (Signed)
No bleeding or hematoma noted after ambulation 

## 2018-12-22 NOTE — Op Note (Signed)
    Patient name: Elizabeth Douglas MRN: [id number] DOB: 13-Jan-1951 Sex: female  12/22/2018 Pre-operative Diagnosis: right lower extremity bypass impending failure Post-operative diagnosis:  Same Surgeon:  Erlene Quan C. Donzetta Matters, MD Procedure Performed: 1.  US guided cannulation of left common femoral artery 2.  Aortogram with right lower extremity angiogram 3.  Selection of right common femoral artery 4.  Moderate sedation with fentanyl and Versed for 19 minutes 5.  Minx device closure left common femoral artery  Indications: 68 year old female with previous undergone right lower extremity thrombectomy and had to have a above-knee below-knee popliteal artery bypass.  She now has decreased velocities in her bypass graft suggesting impending failure.  Findings: While her bypass is patulous there is certainly no flow limitation.  No intervention was undertaken.  We will use her recent duplex as her new baseline.   Procedure:  The patient was identified in the holding area and taken to room 8.  The patient was then placed supine on the table and prepped and draped in the usual sterile fashion.  A time out was called.  Ultrasound was used to evaluate the left common femoral artery which was noted to be patent.  The area was anesthetized 1% lidocaine.  Using direct ultrasound guidance the artery was cannulated micropuncture needle followed by wire sheath.  An image was saved the permanent record.  A Bentson was placed and we changed for a 5 [place] sheath.  Omni catheter placed the level of L1 and aortogram performed.  We then crossed bifurcation with Omni catheter and Bentson wire placed in the common femoral artery.  Right lower extremity angiogram was performed.  She had three-vessel runoff to the ankle bypass did not appear to have any stenosis.  With this we remove the catheter wire.  Minx device was deployed in the left common femoral artery cannulation site.  Pressure held.  She tolerated procedure well  without immediate complication.   Contrast: 60cc    Payton Moder C. Donzetta Matters, MD [facility] Office: [phone] Pager: [phone]  \

## 2018-12-22 NOTE — Telephone Encounter (Signed)
sch appt lvm mld ltr 06/23/2019 10am RLE 11am ABI 115am f/u NP

## 2018-12-22 NOTE — Telephone Encounter (Signed)
-----   Message from Waynetta Sandy, MD sent at 12/22/2018 11:25 AM EST ----- Elizabeth Douglas [id number] 04/10/1951  12/22/2018 Pre-operative Diagnosis: right lower extremity bypass impending failure Post-operative diagnosis:  Same Surgeon:  Erlene Quan C. Donzetta Matters, MD Procedure Performed: 1.  US guided cannulation of left common femoral artery 2.  Aortogram with right lower extremity angiogram 3.  Selection of right common femoral artery 4.  Moderate sedation with fentanyl and Versed for 19 minutes 5.  Minx device closure left common femoral artery   F/u in 6 months with me/np/pa with right leg bypass duplex and abi

## 2018-12-27 ENCOUNTER — Telehealth: Payer: Self-pay

## 2018-12-27 NOTE — Telephone Encounter (Signed)
Returned patient call. She has developed a fish allergy and made that known to staff before her procedure. They premedicated her with some Benadryl. Post procedure she developed redness under breasts, vaginal irritation from yeast, and possible yeast in her mouth. That has gotten better. Recommended that she report this to providers from here on out of possible allergy to the dye used in her procedure.

## 2018-12-28 DIAGNOSIS — R69 Illness, unspecified: Secondary | ICD-10-CM | POA: Diagnosis not present

## 2018-12-30 DIAGNOSIS — Z85828 Personal history of other malignant neoplasm of skin: Secondary | ICD-10-CM | POA: Diagnosis not present

## 2018-12-30 DIAGNOSIS — L820 Inflamed seborrheic keratosis: Secondary | ICD-10-CM | POA: Diagnosis not present

## 2018-12-30 DIAGNOSIS — Z23 Encounter for immunization: Secondary | ICD-10-CM | POA: Diagnosis not present

## 2018-12-30 DIAGNOSIS — L814 Other melanin hyperpigmentation: Secondary | ICD-10-CM | POA: Diagnosis not present

## 2018-12-30 DIAGNOSIS — D2262 Melanocytic nevi of left upper limb, including shoulder: Secondary | ICD-10-CM | POA: Diagnosis not present

## 2018-12-30 DIAGNOSIS — D2272 Melanocytic nevi of left lower limb, including hip: Secondary | ICD-10-CM | POA: Diagnosis not present

## 2018-12-30 DIAGNOSIS — Z808 Family history of malignant neoplasm of other organs or systems: Secondary | ICD-10-CM | POA: Diagnosis not present

## 2018-12-30 DIAGNOSIS — L570 Actinic keratosis: Secondary | ICD-10-CM | POA: Diagnosis not present

## 2019-01-26 ENCOUNTER — Encounter: Payer: Self-pay | Admitting: Family Medicine

## 2019-02-02 DIAGNOSIS — Z96641 Presence of right artificial hip joint: Secondary | ICD-10-CM | POA: Diagnosis not present

## 2019-02-02 DIAGNOSIS — M1612 Unilateral primary osteoarthritis, left hip: Secondary | ICD-10-CM | POA: Diagnosis not present

## 2019-02-02 DIAGNOSIS — Z471 Aftercare following joint replacement surgery: Secondary | ICD-10-CM | POA: Diagnosis not present

## 2019-02-13 ENCOUNTER — Encounter: Payer: Self-pay | Admitting: Family Medicine

## 2019-02-13 ENCOUNTER — Ambulatory Visit (INDEPENDENT_AMBULATORY_CARE_PROVIDER_SITE_OTHER): Payer: Medicare HMO | Admitting: Family Medicine

## 2019-02-13 ENCOUNTER — Other Ambulatory Visit: Payer: Self-pay

## 2019-02-13 VITALS — BP 102/78 | HR 71 | Temp 98.1°F | Ht 66.5 in | Wt 152.0 lb

## 2019-02-13 DIAGNOSIS — E559 Vitamin D deficiency, unspecified: Secondary | ICD-10-CM | POA: Diagnosis not present

## 2019-02-13 DIAGNOSIS — D696 Thrombocytopenia, unspecified: Secondary | ICD-10-CM | POA: Diagnosis not present

## 2019-02-13 DIAGNOSIS — Z0000 Encounter for general adult medical examination without abnormal findings: Secondary | ICD-10-CM

## 2019-02-13 DIAGNOSIS — E785 Hyperlipidemia, unspecified: Secondary | ICD-10-CM | POA: Diagnosis not present

## 2019-02-13 DIAGNOSIS — Z79899 Other long term (current) drug therapy: Secondary | ICD-10-CM | POA: Diagnosis not present

## 2019-02-13 LAB — CBC
HCT: 42.7 % (ref 36.0–46.0)
Hemoglobin: 14.8 g/dL (ref 12.0–15.0)
MCHC: 34.6 g/dL (ref 30.0–36.0)
MCV: 94.2 fl (ref 78.0–100.0)
PLATELETS: 183 10*3/uL (ref 150.0–400.0)
RBC: 4.53 Mil/uL (ref 3.87–5.11)
RDW: 13.2 % (ref 11.5–15.5)
WBC: 4.6 10*3/uL (ref 4.0–10.5)

## 2019-02-13 LAB — LIPID PANEL
CHOLESTEROL: 196 mg/dL (ref 0–200)
HDL: 56.9 mg/dL (ref >39.00)
LDL Cholesterol: 125 mg/dL — ABNORMAL HIGH (ref 0–99)
NonHDL: 139.11
Total CHOL/HDL Ratio: 3
Triglycerides: 73 mg/dL (ref 0.0–149.0)
VLDL: 14.6 mg/dL (ref 0.0–40.0)

## 2019-02-13 LAB — COMPREHENSIVE METABOLIC PANEL
ALK PHOS: 53 U/L (ref 39–117)
ALT: 7 U/L (ref 0–35)
AST: 16 U/L (ref 0–37)
Albumin: 4.3 g/dL (ref 3.5–5.2)
BUN: 9 mg/dL (ref 6–23)
CALCIUM: 9.3 mg/dL (ref 8.4–10.5)
CO2: 31 mEq/L (ref 19–32)
Chloride: 103 mEq/L (ref 96–112)
Creatinine, Ser: 0.64 mg/dL (ref 0.40–1.20)
GFR: 92.93 mL/min (ref >60.00)
Glucose, Bld: 93 mg/dL (ref 70–99)
Potassium: 4.3 mEq/L (ref 3.5–5.1)
Sodium: 140 mEq/L (ref 135–145)
Total Bilirubin: 0.4 mg/dL (ref 0.2–1.2)
Total Protein: 6.7 g/dL (ref 6.0–8.3)

## 2019-02-13 LAB — VITAMIN D 25 HYDROXY (VIT D DEFICIENCY, FRACTURES): VITD: 41.67 ng/mL (ref 30.00–100.00)

## 2019-02-13 LAB — VITAMIN B12: Vitamin B-12: 350 pg/mL (ref 211–911)

## 2019-02-13 NOTE — Progress Notes (Signed)
Phone: [phone]   Subjective:  Patient presents today for their Welcome to Medicare Exam    Preventive Screening-Counseling & Management  Vision screen: normal. Hearing screen normal.   Hearing Screening   125Hz  250Hz  500Hz  1000Hz  2000Hz  3000Hz  4000Hz  6000Hz  8000Hz   Right ear:       Pass    Left ear:      Pass       Visual Acuity Screening   Right eye Left eye Both eyes  Without correction:     With correction: 20/20 20/20 20/20    Advanced directives:  As of today- she is full code. unless it was thought there could be meaningful recovery. They have a lawyer they can work with   Smoking Status: Never Smoker Second Hand Smoking status: No smokers in home  Risk Factors Regular exercise: yes 5 to 6 days a week Diet: Reasonably healthy/normal BMI Fall Risk: None  Fall Risk  02/13/2019  Falls in the past year? 0  Number falls in past yr: 0  Injury with Fall? 0   Opioid use history:  no long term opioids use  BMI monitoring- elevated BMI noted: Body mass index is 24.17 kg/m. Encouraged need for healthy eating, regular exercise to be continued  Cardiac risk factors:  advanced age (older than 81 for men, 36 for women) - yes Hyperlipidemia -yes but mild with 10-year ASCVD risk under 3.5% No diabetes.  Family History: Yes brother with heart attack before age 10.    Depression Screen None. PHQ2 0  Depression screen [facility] 2/9 02/13/2019 01/06/2018 04/22/2016  Decreased Interest 0 0 0  Down, Depressed, Hopeless 0 0 0  PHQ - 2 Score 0 0 0    Activities of Daily Living Independent ADLs and IADLs   Hearing Difficulties: -patient declines  Cognitive Testing             No reported trouble.    Mini cog normal today. Normal clock draw and 3 word recall  List the Names of Other Physician/Practitioners you currently use: -Dr. Benjie Karvonen of GYN -Dr. Delman Cheadle of dermatology -Recent hospitalization for leg pain in February 2020  -Hip replacement within the last year- February 04 2018  actually  Immunization History  Administered Date(s) Administered  . Influenza Whole 09/19/2007  . Influenza, High Dose Seasonal PF 09/09/2018  . Influenza,inj,Quad PF,6+ Mos 09/26/2013, 08/30/2014, 09/04/2015, 09/13/2017  . Influenza-Unspecified 09/22/2017, 09/09/2018  . Td 05/12/2004  . Tdap 04/22/2016  . Zoster 11/14/2014   Required Immunizations needed today -declines immunizations which is reasonable in light of recent covid-19 pandemic. Will only do pneumovax 23  Screening tests- up to date-just need to get a copy of her most recent Pap smear  ROS- No pertinent positives discovered in course of AWV ROS- No chest pain or shortness of breath. No headache or blurry vision.   The following were reviewed and entered/updated in epic: Past Medical History:  Diagnosis Date  . Anal fissure   . Arthritis    hip  . Atypical chest pain   . Basal cell carcinoma   . Complication of anesthesia    BP drops and passes out  [place] vomiting  . Excess or deficiency of vitamin D   . Gallbladder polyp   . GERD (gastroesophageal reflux disease)   . Hemorrhoids   . Microhematuria    pt. denies  . Osteopenia   . PONV (postoperative nausea and vomiting)   . Thrombocytopenia [facility])    Patient Active Problem List   Diagnosis  Date Noted  . Popliteal artery occlusion, right ([place]) 11/29/2015    Priority: High  . History of skin cancer 04/22/2016    Priority: Medium  . Hyperlipidemia 04/22/2016    Priority: Medium  . THROMBOCYTOPENIA, CHRONIC 11/13/2009    Priority: Medium  . Osteopenia of left femoral neck 09/18/2008    Priority: Medium  . GERD 07/10/2008    Priority: Medium  . Lumbar radiculopathy 11/20/2015    Priority: Low  . Gluteal tendinitis of left buttock 11/14/2015    Priority: Low  . Tensor fascia lata syndrome 10/08/2015    Priority: Low  . Hemorrhoids     Priority: Low  . Thrombosed external hemorrhoid 10/19/2014    Priority: Low  . Anal fissure - posterior 01/29/2014     Priority: Low  . CHEST PAIN 01/10/2009    Priority: Low  . BENIGN POSITIONAL VERTIGO 01/18/2008    Priority: Low  . Vitamin D deficiency 05/20/2018  . OA (osteoarthritis) of hip 02/16/2018   Past Surgical History:  Procedure Laterality Date  . ABDOMINAL AORTOGRAM W/LOWER EXTREMITY Right 12/22/2018   Procedure: ABDOMINAL AORTOGRAM W/LOWER EXTREMITY [place];  Surgeon: Waynetta Sandy, MD;  Location: [place] [facility];  Service: Cardiovascular;  Laterality: Right;  . COLONOSCOPY  2005, 2015  . INTRAOPERATIVE ARTERIOGRAM Right 11/29/2015   Procedure: INTRA OPERATIVE ARTERIOGRAM;  Surgeon: Conrad Nelson, MD;  Location: [place];  Service: Vascular;  Laterality: Right;  . JOINT REPLACEMENT     right total hip Aluisio 02-16-18  . LUNG BIOPSY    . THROMBECTOMY FEMORAL ARTERY Right 11/29/2015   Procedure:  RIGHT  POPLITEAL to anterior tibial and posterior tibial ARTERY thrombectomy. Right above knee to below knee popliteal artery bypass;  Surgeon: Conrad Buckingham, MD;  Location: [place];  Service: Vascular;  Laterality: Right;  . TOTAL HIP ARTHROPLASTY Right 02/16/2018   Procedure: RIGHT TOTAL HIP ARTHROPLASTY ANTERIOR APPROACH;  Surgeon: Gaynelle Arabian, MD;  [facility];  Service: Orthopedics;  Laterality: Right;  . UPPER GASTROINTESTINAL ENDOSCOPY  2009    Family History  Problem Relation Age of Onset  . COPD Mother   . Osteoporosis Mother   . Prostate cancer Father   . Heart disease Brother 19       Before age 23  . Heart attack Brother 42       Massive  . Lymphoma Brother        non and hodgkins  . Coronary artery disease Brother   . Colon cancer Neg Hx   . Colon polyps Neg Hx   . Rectal cancer Neg Hx   . Stomach cancer Neg Hx     Medications- reviewed and updated Current Outpatient Medications  Medication Sig Dispense Refill  . acetaminophen (TYLENOL) 500 MG tablet Take 1,000 mg by mouth at bedtime as needed for moderate pain.     Marland Kitchen aspirin (ASPIRIN 81) 81 MG  chewable tablet Chew 81 mg by mouth daily.     . Carboxymethylcellul-Glycerin (LUBRICATING EYE DROPS OP) Place 1 drop into both eyes daily.    . Cholecalciferol (VITAMIN D3) 25 MCG (1000 UT) CAPS Take 1,000 Units by mouth daily.     Marland Kitchen conjugated estrogens (PREMARIN) vaginal cream Place 1 Applicatorful vaginally See admin instructions. On Sundays and Wednesdays.    Marland Kitchen omeprazole (PRILOSEC) 20 MG capsule Take 1 capsule (20 mg total) by mouth 2 (two) times daily. (Patient taking differently: Take 20 mg by mouth daily. ) 180 capsule 3  .  Wheat Dextrin (BENEFIBER) POWD Take 1 Dose by mouth daily.      No current facility-administered medications for this visit.     Allergies-reviewed and updated Allergies  Allergen Reactions  . Fish Allergy Swelling    Pt felt like throat was closing last time she ate   . Iodixanol Rash    Social History   Socioeconomic History  . Marital status: Married    Spouse name: Not on file  . Number of children: 1  . Years of education: Not on file  . Highest education level: Not on file  Occupational History  . Occupation: Retired  Scientific laboratory technician  . Financial resource strain: Not on file  . Food insecurity:    Worry: Not on file    Inability: Not on file  . Transportation needs:    Medical: Not on file    Non-medical: Not on file  Tobacco Use  . Smoking status: Never Smoker  . Smokeless tobacco: Never Used  Substance and Sexual Activity  . Alcohol use: No    Alcohol/week: 0.0 standard drinks  . Drug use: No  . Sexual activity: Yes  Lifestyle  . Physical activity:    Days per week: Not on file    Minutes per session: Not on file  . Stress: Not on file  Relationships  . Social connections:    Talks on phone: Not on file    Gets together: Not on file    Attends religious service: Not on file    Active member of club or organization: Not on file    Attends meetings of clubs or organizations: Not on file    Relationship status: Not on file  Other  Topics Concern  . Not on file  Social History Narrative   Married. 1 daughter. No grandkids. 1 granddog and 1 grandcat.       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       Hobbies: walking 2.5 miles  [place], stays active   Objective  Objective:  BP 102/78 (BP Location: Left Arm, Patient Position: Sitting, Cuff Size: Normal)   Pulse 71   Temp 98.1 F (36.7 C) (Oral)   Ht 5' 6.5" (1.689 m)   Wt 152 lb (68.9 kg)   SpO2 97%   BMI 24.17 kg/m  Gen: NAD, resting comfortably HEENT: Mucous membranes are moist. Oropharynx normal Neck: no thyromegaly CV: RRR no murmurs rubs or gallops Lungs: CTAB no crackles, wheeze, rhonchi Abdomen: soft/nontender/nondistended/normal bowel sounds. No rebound or guarding.  Ext: no edema Skin: warm, dry Neuro: grossly normal, moves all extremities, PERRLA   Assessment and Plan:   Welcome to Medicare exam completed- discussed recommended screenings and documented any personalized health advice and referrals for preventive counseling. See AVS as well which was given to patient.   Status of chronic or acute concerns  Recently had procedure to evaluate vascular flow-no intervention was undertaken with vascular- she states was told flow was down from 40% to 19% but this was her new normal and no new intervention.  History of popliteal artery occlusion on right-on chronic aspirin.  Sees vascular yearly.  Osteopenia of left femoral neck-2019 updated exam- will plan on 2-3 year repeat. Actually borderline on lumbar spine at -2.4 (but still under threshold for fosamax treatment)  Hyperlipidemia-mild with under 3.5% 10-year risk of heart attack  GERD-on Prilosec 20 mg twice a day-daily use did not work.  B12 was okay  on last check Lab Results  Component Value Date   VITAMINB12 349 05/20/2018   Vitamin D-continue to take 2000 units a day  No problem-specific Assessment & Plan notes found for this  encounter.   Future Appointments  Date Time Provider [place]  06/23/2019 10:00 AM [facility]  06/23/2019 11:00 AM [facility]  06/23/2019 11:15 AM Waynetta Sandy, MD [facility]   Return in about 1 year (around 02/13/2020) for physical.   Lab/Order associations: Preventative health care - Plan: CBC, Comprehensive metabolic panel, Lipid panel, Vitamin B12, VITAMIN D 25 Hydroxy (Vit-D Deficiency, Fractures)  Hyperlipidemia, unspecified hyperlipidemia type - Plan: CBC, Comprehensive metabolic panel, Lipid panel  High risk medication use - Plan: Vitamin B12  THROMBOCYTOPENIA, CHRONIC - Plan: CBC  Vitamin D deficiency - Plan: VITAMIN D 25 Hydroxy (Vit-D Deficiency, Fractures)  No orders of the defined types were placed in this encounter.   Return precautions advised. Garret Reddish, MD

## 2019-02-13 NOTE — Patient Instructions (Addendum)
Health Maintenance Due  Topic Date Due  . PNA vac Low Risk Adult (1 of 2 - PCV13) Postponed due to current covid-19 pandemic 06/06/2018  . PAP SMEAR-Modifier Please stop by the front desk to sign a release of information  10/20/2018     Ms. Oberman , Thank you for taking time to come for your Medicare Wellness Visit. I appreciate your ongoing commitment to your health goals. Please review the following plan we discussed and let me know if I can assist you in the future.   These are the goals we discussed: 1. Please stop by lab before you go If you do not have mychart- we will call you about results within 5 business days of [place] receiving them.  If you have mychart- we will send your results within 3 business days of [place] receiving them.  If abnormal or we want to clarify a result, we will call or mychart you to make sure you receive the message.  If you have questions or concerns or don't hear within 5-7 days, please send [place] a message or call us.  2. Need pneumovax 23 but holding off until next year given current covid 19 situation    This is a list of the screening recommended for you and due dates:  Health Maintenance  Topic Date Due  . Pap Smear  10/20/2018  . Pneumonia vaccines (1 of 2 - PCV13) 04/13/2019*  . Mammogram  11/26/2019  . Colon Cancer Screening  01/09/2024  . Tetanus Vaccine  04/22/2026  . Flu Shot  Completed  . DEXA scan (bone density measurement)  Completed  .  Hepatitis C: One time screening is recommended by Center for Disease Control  (CDC) for  adults born from [birth year] through 1965.   Completed  . HIV Screening  Completed  *Topic was postponed. The date shown is not the original due date.

## 2019-03-06 ENCOUNTER — Encounter: Payer: Self-pay | Admitting: Family Medicine

## 2019-05-10 DIAGNOSIS — H5203 Hypermetropia, bilateral: Secondary | ICD-10-CM | POA: Diagnosis not present

## 2019-05-29 DIAGNOSIS — Z0100 Encounter for examination of eyes and vision without abnormal findings: Secondary | ICD-10-CM | POA: Diagnosis not present

## 2019-06-23 ENCOUNTER — Ambulatory Visit: Payer: Medicare HMO | Admitting: Family

## 2019-06-23 ENCOUNTER — Other Ambulatory Visit: Payer: Self-pay

## 2019-06-23 ENCOUNTER — Ambulatory Visit: Payer: Medicare HMO | Admitting: Vascular Surgery

## 2019-06-23 ENCOUNTER — Encounter (HOSPITAL_COMMUNITY): Payer: Medicare HMO

## 2019-06-23 ENCOUNTER — Other Ambulatory Visit (HOSPITAL_COMMUNITY): Payer: Medicare HMO

## 2019-06-23 DIAGNOSIS — I739 Peripheral vascular disease, unspecified: Secondary | ICD-10-CM

## 2019-06-28 ENCOUNTER — Other Ambulatory Visit: Payer: Self-pay | Admitting: Family Medicine

## 2019-06-29 ENCOUNTER — Telehealth (HOSPITAL_COMMUNITY): Payer: Self-pay

## 2019-06-29 NOTE — Telephone Encounter (Signed)

## 2019-06-30 ENCOUNTER — Other Ambulatory Visit: Payer: Self-pay

## 2019-06-30 ENCOUNTER — Ambulatory Visit: Payer: Medicare HMO | Admitting: Vascular Surgery

## 2019-06-30 ENCOUNTER — Ambulatory Visit (INDEPENDENT_AMBULATORY_CARE_PROVIDER_SITE_OTHER)
Admission: RE | Admit: 2019-06-30 | Discharge: 2019-06-30 | Disposition: A | Discharge status: Home / Self Care | Payer: Medicare HMO | Source: Ambulatory Visit | Attending: Family | Admitting: Family

## 2019-06-30 ENCOUNTER — Ambulatory Visit (HOSPITAL_COMMUNITY)
Admission: RE | Admit: 2019-06-30 | Discharge: 2019-06-30 | Disposition: A | Discharge status: Home / Self Care | Payer: Medicare HMO | Source: Ambulatory Visit | Attending: Family | Admitting: Family

## 2019-06-30 VITALS — BP 105/74 | HR 70 | Temp 98.0°F | Resp 18 | Ht 66.5 in | Wt 147.7 lb

## 2019-06-30 DIAGNOSIS — I739 Peripheral vascular disease, unspecified: Secondary | ICD-10-CM | POA: Insufficient documentation

## 2019-06-30 DIAGNOSIS — Z95828 Presence of other vascular implants and grafts: Secondary | ICD-10-CM | POA: Diagnosis not present

## 2019-06-30 NOTE — Progress Notes (Signed)
Patient ID: Elizabeth Douglas, female   DOB: 06/15/1951, 67 y.o.   MRN: [id number]  Reason for Consult: Follow-up   Referred by Marin Olp, MD  Subjective:     HPI:  Elizabeth Douglas is a 68 y.o. female had a remote history of right above the below-knee popliteal artery bypass for acute limb ischemia after attempted thrombectomy.  This was of unknown etiology.  Done in 2015 with Dr. Bridgett Larsson.  Recently was found to have diminished velocities in the bypass underwent angiogram where there was noted to be a patulous vein but really no abnormalities to intervene upon.  She now follows up with 25-month follow-up.  She continues to take aspirin.  Does not take any blood thinners.  Walks without limitation at this time.  Does have some sensation changes in the right lower extremity but otherwise no symptoms at this time.  Past Medical History:  Diagnosis Date  . Anal fissure   . Arthritis    hip  . Atypical chest pain   . Basal cell carcinoma   . Complication of anesthesia    BP drops and passes out  [place] vomiting  . Excess or deficiency of vitamin D   . Gallbladder polyp   . GERD (gastroesophageal reflux disease)   . Hemorrhoids   . Microhematuria    pt. denies  . Osteopenia   . PONV (postoperative nausea and vomiting)   . Thrombocytopenia ([place])    Family History  Problem Relation Age of Onset  . COPD Mother   . Osteoporosis Mother   . Prostate cancer Father   . Heart disease Brother 86       Before age 47  . Heart attack Brother 42       Massive  . Lymphoma Brother        non and hodgkins  . Coronary artery disease Brother   . Colon cancer Neg Hx   . Colon polyps Neg Hx   . Rectal cancer Neg Hx   . Stomach cancer Neg Hx    Past Surgical History:  Procedure Laterality Date  . ABDOMINAL AORTOGRAM W/LOWER EXTREMITY Right 12/22/2018   Procedure: ABDOMINAL AORTOGRAM W/LOWER EXTREMITY [place];  Surgeon: Waynetta Sandy, MD;  Location: [place] [facility];  Service:  Cardiovascular;  Laterality: Right;  . COLONOSCOPY  2005, 2015  . INTRAOPERATIVE ARTERIOGRAM Right 11/29/2015   Procedure: INTRA OPERATIVE ARTERIOGRAM;  Surgeon: Conrad Neuse Forest, MD;  Location: [place];  Service: Vascular;  Laterality: Right;  . JOINT REPLACEMENT     right total hip Aluisio 02-16-18  . LUNG BIOPSY    . THROMBECTOMY FEMORAL ARTERY Right 11/29/2015   Procedure:  RIGHT  POPLITEAL to anterior tibial and posterior tibial ARTERY thrombectomy. Right above knee to below knee popliteal artery bypass;  Surgeon: Conrad Newport, MD;  Location: [place];  Service: Vascular;  Laterality: Right;  . TOTAL HIP ARTHROPLASTY Right 02/16/2018   Procedure: RIGHT TOTAL HIP ARTHROPLASTY ANTERIOR APPROACH;  Surgeon: Gaynelle Arabian, MD;  [facility];  Service: Orthopedics;  Laterality: Right;  . UPPER GASTROINTESTINAL ENDOSCOPY  2009    Short Social History:  Social History   Tobacco Use  . Smoking status: Never Smoker  . Smokeless tobacco: Never Used  Substance Use Topics  . Alcohol use: No    Alcohol/week: 0.0 standard drinks    Allergies  Allergen Reactions  . Fish Allergy Swelling    Pt felt like throat was closing last time she  ate   . Iodixanol Rash    Current Outpatient Medications  Medication Sig Dispense Refill  . acetaminophen (TYLENOL) 500 MG tablet Take 1,000 mg by mouth at bedtime as needed for moderate pain.     Marland Kitchen aspirin (ASPIRIN 81) 81 MG chewable tablet Chew 81 mg by mouth daily.     . Carboxymethylcellul-Glycerin (LUBRICATING EYE DROPS OP) Place 1 drop into both eyes daily.    . Cholecalciferol (VITAMIN D3) 25 MCG (1000 UT) CAPS Take 1,000 Units by mouth daily.     Marland Kitchen conjugated estrogens (PREMARIN) vaginal cream Place 1 Applicatorful vaginally See admin instructions. On Sundays and Wednesdays.    . omeprazole (PRILOSEC) 20 MG capsule TAKE 1 CAPSULE BY MOUTH TWICE A DAY 180 capsule 1  . Wheat Dextrin (BENEFIBER) POWD Take 1 Dose by mouth daily.      No current  facility-administered medications for this visit.     Review of Systems  Constitutional:  Constitutional negative. HENT: HENT negative.  Eyes: Eyes negative.  Respiratory: Respiratory negative.  Cardiovascular: Cardiovascular negative.  GI: Gastrointestinal negative.  Musculoskeletal: Musculoskeletal negative.  Skin: Skin negative.  Neurological: Positive for numbness.  Hematologic: Hematologic/lymphatic negative.  Psychiatric: Psychiatric negative.        Objective:  Objective   Vitals:   06/30/19 1048  BP: 105/74  Pulse: 70  Resp: 18  Temp: 98 F (36.7 C)  TempSrc: Temporal  SpO2: 100%  Weight: 147 lb 11.2 oz (67 kg)  Height: 5\' 6.5" (1.689 m)   Body mass index is 23.48 kg/m.  Physical Exam HENT:     Head: Normocephalic.     Nose: Nose normal.     Mouth/Throat:     Mouth: Mucous membranes are moist.  Eyes:     Pupils: Pupils are equal, round, and reactive to light.  Neck:     Musculoskeletal: Normal range of motion and neck supple.  Cardiovascular:     Rate and Rhythm: Normal rate.     Pulses:          Radial pulses are 2+ on the right side and 2+ on the left side.       Popliteal pulses are 2+ on the right side and 2+ on the left side.       Dorsalis pedis pulses are 2+ on the right side and 2+ on the left side.  Pulmonary:     Effort: Pulmonary effort is normal.  Abdominal:     General: Abdomen is flat.     Palpations: Abdomen is soft.  Musculoskeletal: Normal range of motion.        General: No swelling.  Skin:    General: Skin is warm and dry.     Capillary Refill: Capillary refill takes less than 2 seconds.  Neurological:     General: No focal deficit present.     Mental Status: She is alert.  Psychiatric:        Mood and Affect: Mood normal.        Behavior: Behavior normal.        Thought Content: Thought content normal.        Judgment: Judgment normal.     Data: I have independently interpreted her lower extremity duplex to be  triphasic throughout.  Lowest velocity 42 cm/s today.  ABIs 1.2 right 1.19 left     Assessment/Plan:     68  year old female status post above the below-knee popliteal artery bypass for acute lower extremity ischemia.  Remains on aspirin.  She did undergo angiography for decreased velocities throughout the bypass graft.  There were no abnormalities found.  She now follows up with repeat duplex and ABIs which are all normal today.  She will follow-up in another 6 months for further studies.  At some point she can certainly be switched to 1 year follow-up but at this time is just uncomfortable with that.  We discussed the signs and symptoms of lower extremity scheming she demonstrates good understanding.     Waynetta Sandy MD [facility]

## 2019-08-22 DIAGNOSIS — R69 Illness, unspecified: Secondary | ICD-10-CM | POA: Diagnosis not present

## 2019-09-29 ENCOUNTER — Encounter: Payer: Self-pay | Admitting: Family Medicine

## 2019-10-10 ENCOUNTER — Encounter: Payer: Self-pay | Admitting: Family Medicine

## 2019-10-10 ENCOUNTER — Other Ambulatory Visit: Payer: Self-pay

## 2019-10-10 ENCOUNTER — Ambulatory Visit (INDEPENDENT_AMBULATORY_CARE_PROVIDER_SITE_OTHER): Payer: Medicare HMO | Admitting: Family Medicine

## 2019-10-10 VITALS — BP 110/78 | HR 98 | Temp 98.2°F | Ht 66.5 in | Wt 149.6 lb

## 2019-10-10 DIAGNOSIS — I70201 Unspecified atherosclerosis of native arteries of extremities, right leg: Secondary | ICD-10-CM

## 2019-10-10 DIAGNOSIS — M720 Palmar fascial fibromatosis [Dupuytren]: Secondary | ICD-10-CM

## 2019-10-10 NOTE — Progress Notes (Signed)
Phone [phone] In person visit   Subjective:   Elizabeth Douglas is a 68 y.o. year old very pleasant female patient who presents for/with See problem oriented charting Chief Complaint  Patient presents with  . Cyst per patient-Dupuytren contracture on exam    inside of left palm first one came up in Jan and second one about 2 weeks ago     ROS- Review of Systems  Constitutional: Negative.   HENT: Negative.   Eyes: Negative.   Respiratory: Negative.   Cardiovascular: Positive for leg swelling.       History of popliteal artery occlusion followed by Vascular every 6 mo  Gastrointestinal: Negative.   Genitourinary: Negative.   Musculoskeletal: Negative.   Skin: Negative.   Neurological: Negative.   Endo/Heme/Allergies: Negative.   Psychiatric/Behavioral: Negative.    Past Medical History-  Patient Active Problem List   Diagnosis Date Noted  . Popliteal artery occlusion, right ([place]) 11/29/2015    Priority: High  . History of skin cancer 04/22/2016    Priority: Medium  . Hyperlipidemia 04/22/2016    Priority: Medium  . THROMBOCYTOPENIA, CHRONIC 11/13/2009    Priority: Medium  . Osteopenia of left femoral neck 09/18/2008    Priority: Medium  . GERD 07/10/2008    Priority: Medium  . Lumbar radiculopathy 11/20/2015    Priority: Low  . Gluteal tendinitis of left buttock 11/14/2015    Priority: Low  . Tensor fascia lata syndrome 10/08/2015    Priority: Low  . Hemorrhoids     Priority: Low  . Thrombosed external hemorrhoid 10/19/2014    Priority: Low  . Anal fissure - posterior 01/29/2014    Priority: Low  . CHEST PAIN 01/10/2009    Priority: Low  . BENIGN POSITIONAL VERTIGO 01/18/2008    Priority: Low  . Dupuytren's contracture of left hand 10/10/2019  . Vitamin D deficiency 05/20/2018  . OA (osteoarthritis) of hip 02/16/2018    Medications- reviewed and updated Current Outpatient Medications  Medication Sig Dispense Refill  . acetaminophen (TYLENOL) 500 MG  tablet Take 1,000 mg by mouth at bedtime as needed for moderate pain.     Marland Kitchen aspirin (ASPIRIN 81) 81 MG chewable tablet Chew 81 mg by mouth daily.     . Carboxymethylcellul-Glycerin (LUBRICATING EYE DROPS OP) Place 1 drop into both eyes daily.    . Cholecalciferol (VITAMIN D3) 25 MCG (1000 UT) CAPS Take 1,000 Units by mouth daily.     Marland Kitchen conjugated estrogens (PREMARIN) vaginal cream Place 1 Applicatorful vaginally See admin instructions. On Sundays and Wednesdays.    Marland Kitchen omeprazole (PRILOSEC) 20 MG capsule TAKE 1 CAPSULE BY MOUTH TWICE A DAY 180 capsule 1  . Wheat Dextrin (BENEFIBER) POWD Take 1 Dose by mouth daily.      No current facility-administered medications for this visit.      Objective:  BP 110/78   Pulse 98   Temp 98.2 F (36.8 C) (Temporal)   Ht 5' 6.5" (1.689 m)   Wt 149 lb 9.6 oz (67.9 kg)   SpO2 97%   BMI 23.78 kg/m  Gen: NAD, resting comfortably CV: RRR no murmurs rubs or gallops Lungs: CTAB no crackles, wheeze, rhonchi Ext: no edema Skin: warm, dry Neuro: Intact distal sensation in the hand MSK: Patient with 2 nodular structures in left palm consistent with Dupuytren contracture.  No contracture of fingertips at this time    Assessment and Plan   # Left hand dupuytren contracture S: Patient has had enlarged areast on  left palm come up- she thought they were cysts. First one was 10 months ago. Second was about two weeks ago. Denies any redness or discharge. Can be tender to touch and uncomfortable when griping. No contracture of fingertips She has not tried any OTC meds to help. Pain goes away with changes in position.  A/P: Left hand with Dupuytren contracture noted.  Pain is not significant enough that patient would like to have intervention.  She has a brother that had lymphoma so she wanted to make sure this was not an enlarged lymph node-reassurance provided.  We discussed there is no specific treatment at this level other than as per up-to-date-potentially using  cushioned handles for repetitive tasks or cushioned gloves.  Discussed could refer to hand surgery for their opinion-she feels like issues are mild and wants to hold off for now.   Popliteal artery occlusion, right ([facility]) We also had a brief discussion today about Premarin.  She states she has had a discussion with her gynecologist about benefits versus risks especially in light of prior popliteal artery occlusion.  We discussed there is some systemic absorption so may slightly increase thrombotic risk.  She is going to discuss with gynecology again at next visit but at present feels like potential benefit outweighs the risks.   Recommended follow up: Recommended physical March 16 or later Future Appointments  Date Time Provider [place]  12/29/2019 10:00 AM [facility] - [facility]  12/29/2019 11:00 AM [facility] 3 - [facility]  12/29/2019 11:40 AM Waynetta Sandy, MD [facility]   Lab/Order associations:   ICD-10-CM   1. Dupuytren's contracture of left hand  M72.0   2. Popliteal artery occlusion, right ([place])  I70.201    Return precautions advised.  Garret Reddish, MD

## 2019-10-10 NOTE — Assessment & Plan Note (Signed)
We also had a brief discussion today about Premarin.  She states she has had a discussion with her gynecologist about benefits versus risks especially in light of prior popliteal artery occlusion.  We discussed there is some systemic absorption so may slightly increase thrombotic risk.  She is going to discuss with gynecology again at next visit but at present feels like potential benefit outweighs the risks.

## 2019-10-10 NOTE — Patient Instructions (Addendum)
Health Maintenance Due  Topic Date Due  . PNA vac Low Risk Adult (1 of 2 - PCV13) will get at next physical  06/06/2018     Dupuytren's Contracture Dupuytren's contracture is a condition in which tissue under the skin of the palm becomes thick. This causes one or more of the fingers to curl inward (contract) toward the palm. After a while, the fingers may not be able to straighten out. This condition affects some or all of the fingers and the palm of the hand. This condition may affect one or both hands. Dupuytren's contracture is a long-term (chronic) condition that develops (progresses) slowly over time. There is no cure, but symptoms can be managed and progression can be slowed with treatment. This condition is usually not dangerous or painful, but it can interfere with everyday tasks. What are the causes?  This condition is caused by tissue (fascia) in the palm that gets thicker and tighter. When the fascia thickens, it pulls on the cords of tissue (tendons) that control finger movement. This causes the fingers to contract. The cause of fascia thickening is not known. However, the condition is often passed along from parent to child (inherited). What increases the risk? The following factors may make you more likely to develop this condition:  Being 67 years of age or older.  Being female.  Having a family history of this condition.  Using tobacco products, including cigarettes, chewing tobacco, and e-cigarettes.  Drinking alcohol excessively.  Having diabetes.  Having a seizure disorder. What are the signs or symptoms? Early symptoms of this condition may include:  Thick, puckered skin on the hand.  One or more lumps (nodules) on the palm. Nodules may be tender when they first appear, but they are generally painless. Later symptoms of this condition may include:  Thick cords of tissue in the palm.  Fingers curled up toward the palm.  Inability to straighten the fingers  into their normal position. Though this condition is usually painless, you may have discomfort when holding or grabbing objects. How is this diagnosed? This condition is diagnosed with a physical exam, which may include:  Looking at your hands and feeling your palms. This is to check for thickened fascia and nodules.  Measuring finger motion.  Doing the [place] tabletop test. You may be asked to try to put your hand on a surface, with your palm down and your fingers straight out. How is this treated? There is no cure for this condition, but treatment can relieve discomfort and make symptoms more manageable. Treatment options may include:  Physical therapy. This can strengthen your hand and increase flexibility.  Occupational therapy. This can help you with everyday tasks that may be more difficult because of your condition.  Shots (injections). Substances may be injected into your hand, such as: ? Medicines that help to decrease swelling (corticosteroids). ? Proteins (collagenase) to weaken thick tissue. After a collagenase injection, your health care provider may stretch your fingers.  Needle aponeurotomy. A needle is pushed through the skin and into the fascia. Moving the needle against the fascia can weaken or break up the thick tissue.  Surgery. This may be needed if your condition causes discomfort or interferes with everyday activities. Physical therapy is usually needed after surgery. No treatment is guaranteed to cure this condition. Recurrence of symptoms is common. Follow these instructions at home: Hand care  Take these actions to help protect your hand from possible injury: ? Use tools that have padded grips. ?  Wear protective gloves while you work with your hands. ? Avoid repetitive hand movements. General instructions  Take over-the-counter and prescription medicines only as told by your health care provider.  Manage any other conditions that you have, such as  diabetes.  If physical therapy was prescribed, do exercises as told by your health care provider.  Do not use any products that contain nicotine or tobacco, such as cigarettes, e-cigarettes, and chewing tobacco. If you need help quitting, ask your health care provider.  If you drink alcohol: ? Limit how much you use to:  0-1 drink a day for women.  0-2 drinks a day for men. ? Be aware of how much alcohol is in your drink. In the [place] one drink equals one 12 oz bottle of beer (355 mL), one 5 oz glass of wine (148 mL), or one 1 oz glass of hard liquor (44 mL).  Keep all follow-up visits as told by your health care provider. This is important. Contact a health care provider if:  You develop new symptoms, or your symptoms get worse.  You have pain that gets worse or does not get better with medicine.  You have difficulty or discomfort with everyday tasks.  You develop numbness or tingling. Get help right away if:  You have severe pain.  Your fingers change color or become unusually cold. Summary  Dupuytren's contracture is a condition in which tissue under the skin of the palm becomes thick.  This condition is caused by tissue (fascia) that thickens. When it thickens, it pulls on the cords of tissue (tendons) that control finger movement and makes the fingers to contract.  You are more likely to develop this condition if you are a man, are over 32 years of age, have a family history of the condition, and drink a lot of alcohol.  This condition can be treated with physical and occupational therapy, injections, and surgery.  Follow instructions about how to care for your hand. Get help right away if you have severe pain or your fingers change color or become cold. This information is not intended to replace advice given to you by your health care provider. Make sure you discuss any questions you have with your health care provider. Document Released: 09/13/2009 Document Revised:  06/07/2018 Document Reviewed: 06/07/2018 Elsevier Patient Education  2020 [employer].

## 2019-12-18 ENCOUNTER — Encounter: Payer: Self-pay | Admitting: Family Medicine

## 2019-12-28 ENCOUNTER — Telehealth (HOSPITAL_COMMUNITY): Payer: Self-pay

## 2019-12-28 ENCOUNTER — Other Ambulatory Visit: Payer: Self-pay

## 2019-12-28 DIAGNOSIS — I739 Peripheral vascular disease, unspecified: Secondary | ICD-10-CM

## 2019-12-28 NOTE — Telephone Encounter (Signed)

## 2019-12-29 ENCOUNTER — Ambulatory Visit (HOSPITAL_COMMUNITY)
Admission: RE | Admit: 2019-12-29 | Discharge: 2019-12-29 | Disposition: A | Discharge status: Home / Self Care | Payer: Medicare HMO | Source: Ambulatory Visit | Attending: Vascular Surgery | Admitting: Vascular Surgery

## 2019-12-29 ENCOUNTER — Ambulatory Visit (INDEPENDENT_AMBULATORY_CARE_PROVIDER_SITE_OTHER)
Admission: RE | Admit: 2019-12-29 | Discharge: 2019-12-29 | Disposition: A | Discharge status: Home / Self Care | Payer: Medicare HMO | Source: Ambulatory Visit | Attending: Vascular Surgery | Admitting: Vascular Surgery

## 2019-12-29 ENCOUNTER — Encounter: Payer: Self-pay | Admitting: Vascular Surgery

## 2019-12-29 ENCOUNTER — Other Ambulatory Visit: Payer: Self-pay

## 2019-12-29 ENCOUNTER — Ambulatory Visit: Payer: Medicare HMO | Admitting: Vascular Surgery

## 2019-12-29 VITALS — BP 111/78 | HR 72 | Temp 97.6°F | Resp 20 | Ht 66.5 in | Wt 149.0 lb

## 2019-12-29 DIAGNOSIS — I739 Peripheral vascular disease, unspecified: Secondary | ICD-10-CM | POA: Diagnosis not present

## 2019-12-29 DIAGNOSIS — Z95828 Presence of other vascular implants and grafts: Secondary | ICD-10-CM

## 2019-12-29 NOTE — Progress Notes (Signed)
Patient ID: Elizabeth Douglas, female   DOB: 12-13-1950, 69 y.o.   MRN: [id number]  Reason for Consult: Follow-up   Referred by Marin Olp, MD  Subjective:     HPI:  Elizabeth Douglas is a 69 y.o. female has history of right above-the-knee to below-knee popliteal artery bypass for acute limb ischemia with Dr. Bridgett Larsson in 2015.  She had diminished velocities last January underwent angiography which was unremarkable.  She continues on aspirin.  She is back to walking without limitation.  Overall she is doing well planning for COVID-19 vaccine in the next 2 weeks.  Past Medical History:  Diagnosis Date  . Anal fissure   . Arthritis    hip  . Atypical chest pain   . Basal cell carcinoma   . Complication of anesthesia    BP drops and passes out  [place] vomiting  . Excess or deficiency of vitamin D   . Gallbladder polyp   . GERD (gastroesophageal reflux disease)   . Hemorrhoids   . Microhematuria    pt. denies  . Osteopenia   . PONV (postoperative nausea and vomiting)   . Thrombocytopenia ([place])    Family History  Problem Relation Age of Onset  . COPD Mother   . Osteoporosis Mother   . Prostate cancer Father   . Heart disease Brother 61       Before age 55  . Heart attack Brother 42       Massive  . Lymphoma Brother        non and hodgkins  . Coronary artery disease Brother   . Colon cancer Neg Hx   . Colon polyps Neg Hx   . Rectal cancer Neg Hx   . Stomach cancer Neg Hx    Past Surgical History:  Procedure Laterality Date  . ABDOMINAL AORTOGRAM W/LOWER EXTREMITY Right 12/22/2018   Procedure: ABDOMINAL AORTOGRAM W/LOWER EXTREMITY [place];  Surgeon: Waynetta Sandy, MD;  Location: [facility];  Service: Cardiovascular;  Laterality: Right;  . COLONOSCOPY  2005, 2015  . INTRAOPERATIVE ARTERIOGRAM Right 11/29/2015   Procedure: INTRA OPERATIVE ARTERIOGRAM;  Surgeon: Conrad Lake Arthur, MD;  Location: [facility];  Service: Vascular;  Laterality: Right;  . JOINT REPLACEMENT      right total hip [place] 02-16-18  . LUNG BIOPSY    . THROMBECTOMY FEMORAL ARTERY Right 11/29/2015   Procedure:  RIGHT  POPLITEAL to anterior tibial and posterior tibial ARTERY thrombectomy. Right above knee to below knee popliteal artery bypass;  Surgeon: Conrad Loyal, MD;  Location: [facility];  Service: Vascular;  Laterality: Right;  . TOTAL HIP ARTHROPLASTY Right 02/16/2018   Procedure: RIGHT TOTAL HIP ARTHROPLASTY ANTERIOR APPROACH;  Surgeon: Gaynelle Arabian, MD;  Location: [facility];  Service: Orthopedics;  Laterality: Right;  . UPPER GASTROINTESTINAL ENDOSCOPY  2009    Short Social History:  Social History   Tobacco Use  . Smoking status: Never Smoker  . Smokeless tobacco: Never Used  Substance Use Topics  . Alcohol use: No    Alcohol/week: 0.0 standard drinks    Allergies  Allergen Reactions  . Fish Allergy Swelling    Pt felt like throat was closing last time she ate   . Iodixanol Rash    Current Outpatient Medications  Medication Sig Dispense Refill  . acetaminophen (TYLENOL) 500 MG tablet Take 1,000 mg by mouth at bedtime as needed for moderate pain.     Marland Kitchen aspirin (ASPIRIN 81) 81 MG chewable tablet  Chew 81 mg by mouth daily.     . Carboxymethylcellul-Glycerin (LUBRICATING EYE DROPS OP) Place 1 drop into both eyes daily.    . Cholecalciferol (VITAMIN D3) 25 MCG (1000 UT) CAPS Take 1,000 Units by mouth daily.     Marland Kitchen conjugated estrogens (PREMARIN) vaginal cream Place 1 Applicatorful vaginally See admin instructions. On Sundays and Wednesdays.    . omeprazole (PRILOSEC) 20 MG capsule TAKE 1 CAPSULE BY MOUTH TWICE A DAY 180 capsule 1  . tretinoin (RETIN-A) 0.05 % cream tretinoin 0.05 % topical cream    . Wheat Dextrin (BENEFIBER) POWD Take 1 Dose by mouth daily.      No current facility-administered medications for this visit.    Review of Systems  Constitutional:  Constitutional negative. HENT: HENT negative.  Eyes: Eyes negative.  Respiratory: Respiratory negative.   Cardiovascular: Cardiovascular negative.  GI: Gastrointestinal negative.  Musculoskeletal: Musculoskeletal negative.  Skin: Skin negative.  Neurological: Positive for numbness.  Psychiatric: Psychiatric negative.        Objective:  Objective   Vitals:   12/29/19 1049  BP: 111/78  Pulse: 72  Resp: 20  Temp: 97.6 F (36.4 C)  SpO2: 100%  Weight: 149 lb (67.6 kg)  Height: 5\' 6.5" (1.689 m)   Body mass index is 23.69 kg/m.  Physical Exam HENT:     Head: Normocephalic.     Nose: Nose normal.     Comments: Mask in place    Mouth/Throat:     Mouth: Mucous membranes are moist.  Cardiovascular:     Rate and Rhythm: Normal rate.     Pulses: Normal pulses.  Pulmonary:     Effort: Pulmonary effort is normal.  Abdominal:     General: Abdomen is flat.     Palpations: Abdomen is soft. There is no mass.  Musculoskeletal:        General: No swelling. Normal range of motion.     Cervical back: Normal range of motion and neck supple.  Skin:    General: Skin is warm.     Capillary Refill: Capillary refill takes less than 2 seconds.  Neurological:     General: No focal deficit present.     Mental Status: She is alert.  Psychiatric:        Mood and Affect: Mood normal.        Behavior: Behavior normal.        Judgment: Judgment normal.     Data: I have independently interpreted her right lower extremity arterial duplex with distal graft velocity 29 but triphasic.  ABIs are 1.1 bilaterally.  Velocity compares to 28 prior to endovascular evaluation last year.     Assessment/Plan:     68  year old female history of above-noted bypass.  She does have some diminished velocities but these are comparable to last year and at that time she had angiography and they were noted to be normal.  Patient wants to follow-up in 6 months I will have her see the PA at that time with repeat duplex and ABIs.  She will continue aspirin.  I will see her again in 1 year if there are no issues prior.      Waynetta Sandy MD [facility]

## 2020-01-01 ENCOUNTER — Other Ambulatory Visit: Payer: Self-pay | Admitting: *Deleted

## 2020-01-01 DIAGNOSIS — I739 Peripheral vascular disease, unspecified: Secondary | ICD-10-CM

## 2020-01-01 DIAGNOSIS — Z95828 Presence of other vascular implants and grafts: Secondary | ICD-10-CM

## 2020-01-03 DIAGNOSIS — Z23 Encounter for immunization: Secondary | ICD-10-CM | POA: Diagnosis not present

## 2020-01-19 DIAGNOSIS — L814 Other melanin hyperpigmentation: Secondary | ICD-10-CM | POA: Diagnosis not present

## 2020-01-19 DIAGNOSIS — D2272 Melanocytic nevi of left lower limb, including hip: Secondary | ICD-10-CM | POA: Diagnosis not present

## 2020-01-19 DIAGNOSIS — D2262 Melanocytic nevi of left upper limb, including shoulder: Secondary | ICD-10-CM | POA: Diagnosis not present

## 2020-01-19 DIAGNOSIS — Z23 Encounter for immunization: Secondary | ICD-10-CM | POA: Diagnosis not present

## 2020-01-19 DIAGNOSIS — L821 Other seborrheic keratosis: Secondary | ICD-10-CM | POA: Diagnosis not present

## 2020-01-19 DIAGNOSIS — L820 Inflamed seborrheic keratosis: Secondary | ICD-10-CM | POA: Diagnosis not present

## 2020-01-19 DIAGNOSIS — L570 Actinic keratosis: Secondary | ICD-10-CM | POA: Diagnosis not present

## 2020-01-19 DIAGNOSIS — Z808 Family history of malignant neoplasm of other organs or systems: Secondary | ICD-10-CM | POA: Diagnosis not present

## 2020-01-19 DIAGNOSIS — D485 Neoplasm of uncertain behavior of skin: Secondary | ICD-10-CM | POA: Diagnosis not present

## 2020-01-19 DIAGNOSIS — Z85828 Personal history of other malignant neoplasm of skin: Secondary | ICD-10-CM | POA: Diagnosis not present

## 2020-02-14 ENCOUNTER — Telehealth: Payer: Self-pay | Admitting: Family Medicine

## 2020-02-14 NOTE — Telephone Encounter (Signed)
I called the patient to schedule her AWV-I with Loma Sousa ([place]) and CPE w/ Dr. Yong Channel.  She said that she will call back to schedule it because she wasn't at home near her calendar.  If patient calls back, please schedule Medicare Wellness Visit (initial) with Loma Sousa at next available opening and CPE with Dr. Yong Channel at next available opening. VDM ([place])

## 2020-03-13 DIAGNOSIS — N952 Postmenopausal atrophic vaginitis: Secondary | ICD-10-CM | POA: Diagnosis not present

## 2020-03-13 DIAGNOSIS — N9411 Superficial (introital) dyspareunia: Secondary | ICD-10-CM | POA: Diagnosis not present

## 2020-03-13 DIAGNOSIS — Z01419 Encounter for gynecological examination (general) (routine) without abnormal findings: Secondary | ICD-10-CM | POA: Diagnosis not present

## 2020-03-13 DIAGNOSIS — Z124 Encounter for screening for malignant neoplasm of cervix: Secondary | ICD-10-CM | POA: Diagnosis not present

## 2020-03-22 DIAGNOSIS — L820 Inflamed seborrheic keratosis: Secondary | ICD-10-CM | POA: Diagnosis not present

## 2020-04-08 DIAGNOSIS — Z1231 Encounter for screening mammogram for malignant neoplasm of breast: Secondary | ICD-10-CM | POA: Diagnosis not present

## 2020-04-08 LAB — HM MAMMOGRAPHY

## 2020-04-22 NOTE — Patient Instructions (Addendum)
Health Maintenance Due  Topic Date Due  . COVID-19 Vaccine (1) updated in chart  Never done  . PNA vac Low Risk Adult (1 of 2 - PCV13) declined  Never done    Please stop by lab before you go If you have mychart- we will send your results within 3 business days of [place] receiving them.  If you do not have mychart- we will call you about results within 5 business days of [place] receiving them.   Schedule your bone density test at check out desk. You may also call directly to X-ray at [phone] to schedule an appointment that is convenient for you.  - located [address] across the street from [place] - in the basement - you do need an appointment for the bone density tests.   Continue your exercising and diet. You are doing amazing.   We will talk about getting your Shingles and Pneumonia vaccinations. If you would like to get sooner just give our office a call.   Ask Dr. Donzetta Matters if they would like for you to start a cholesterol medication at your next visit.   Continue follow up with Dermatology. So glad you have graduated to once a year.   Let our office know if you need clearance form filled out for hip surgery in next few months.    Elizabeth Douglas , Thank you for taking time to come for your Medicare Wellness Visit. I appreciate your ongoing commitment to your health goals. Please review the following plan we discussed and let me know if I can assist you in the future.   These are the goals we discussed:  Continue your exercising and diet. You are doing amazing.  This is a list of the screening recommended for you and due dates:  Health Maintenance  Topic Date Due  . Pneumonia vaccines (1 of 2 - PCV13) 04/25/2021*  . Flu Shot  06/30/2020  . Mammogram  04/08/2021  . Colon Cancer Screening  01/09/2024  . Tetanus Vaccine  04/22/2026  . DEXA scan (bone density measurement)  Completed  . COVID-19 Vaccine  Completed  .  Hepatitis C: One time screening is recommended by Center  for Disease Control  (CDC) for  adults born from [birth year] through 1963.   Completed  *Topic was postponed. The date shown is not the original due date.

## 2020-04-22 NOTE — Progress Notes (Signed)
Phone: [phone]      Subjective:  Patient presents today for their annual physical. Chief complaint-noted.   See problem oriented charting- ROS- full  review of systems was completed and negative except for: mild runny nose at times with allergies  The following were reviewed and entered/updated in epic: Past Medical History:  Diagnosis Date  . Anal fissure   . Arthritis    hip  . Atypical chest pain   . Basal cell carcinoma   . Complication of anesthesia    BP drops and passes out  [place] vomiting  . Excess or deficiency of vitamin D   . Gallbladder polyp   . GERD (gastroesophageal reflux disease)   . Hemorrhoids   . Microhematuria    pt. denies  . Osteopenia   . PONV (postoperative nausea and vomiting)   . Thrombocytopenia [facility])    Patient Active Problem List   Diagnosis Date Noted  . Popliteal artery occlusion, right ([place]) 11/29/2015    Priority: High  . Vitamin D deficiency 05/20/2018    Priority: Medium  . History of skin cancer 04/22/2016    Priority: Medium  . Hyperlipidemia 04/22/2016    Priority: Medium  . Osteopenia of left femoral neck 09/18/2008    Priority: Medium  . GERD 07/10/2008    Priority: Medium  . Dupuytren's contracture of left hand 10/10/2019    Priority: Low  . OA (osteoarthritis) of hip 02/16/2018    Priority: Low  . Lumbar radiculopathy 11/20/2015    Priority: Low  . Gluteal tendinitis of left buttock 11/14/2015    Priority: Low  . Tensor fascia lata syndrome 10/08/2015    Priority: Low  . Hemorrhoids     Priority: Low  . Thrombosed external hemorrhoid 10/19/2014    Priority: Low  . Anal fissure - posterior 01/29/2014    Priority: Low  . CHEST PAIN 01/10/2009    Priority: Low  . BENIGN POSITIONAL VERTIGO 01/18/2008    Priority: Low   Past Surgical History:  Procedure Laterality Date  . ABDOMINAL AORTOGRAM W/LOWER EXTREMITY Right 12/22/2018   Procedure: ABDOMINAL AORTOGRAM W/LOWER EXTREMITY Runoff;  Surgeon: Waynetta Sandy, MD;  Location: [place] [facility];  Service: Cardiovascular;  Laterality: Right;  . COLONOSCOPY  2005, 2015  . INTRAOPERATIVE ARTERIOGRAM Right 11/29/2015   Procedure: INTRA OPERATIVE ARTERIOGRAM;  Surgeon: Conrad [place] Hudson, MD;  Location: [place];  Service: Vascular;  Laterality: Right;  . JOINT REPLACEMENT     right total hip Aluisio 02-16-18  . LUNG BIOPSY    . THROMBECTOMY FEMORAL ARTERY Right 11/29/2015   Procedure:  RIGHT  POPLITEAL to anterior tibial and posterior tibial ARTERY thrombectomy. Right above knee to below knee popliteal artery bypass;  Surgeon: Conrad Ma[place] Rock, MD;  Location: [place];  Service: Vascular;  Laterality: Right;  . TOTAL HIP ARTHROPLASTY Right 02/16/2018   Procedure: RIGHT TOTAL HIP ARTHROPLASTY ANTERIOR APPROACH;  Surgeon: Gaynelle Arabian, MD;  [facility];  Service: Orthopedics;  Laterality: Right;  . UPPER GASTROINTESTINAL ENDOSCOPY  2009    Family History  Problem Relation Age of Onset  . COPD Mother   . Osteoporosis Mother   . Prostate cancer Father   . Heart disease Brother 25       Before age 6  . Heart attack Brother 42       Massive  . Lymphoma Brother        non and hodgkins  . Coronary artery disease Brother   . Colon  cancer Neg Hx   . Colon polyps Neg Hx   . Rectal cancer Neg Hx   . Stomach cancer Neg Hx     Medications- reviewed and updated Current Outpatient Medications  Medication Sig Dispense Refill  . acetaminophen (TYLENOL) 500 MG tablet Take 1,000 mg by mouth at bedtime as needed for moderate pain.     Marland Kitchen aspirin (ASPIRIN 81) 81 MG chewable tablet Chew 81 mg by mouth daily.     . Carboxymethylcellul-Glycerin (LUBRICATING EYE DROPS OP) Place 1 drop into both eyes daily.    . Cholecalciferol (VITAMIN D3) 25 MCG (1000 UT) CAPS Take 2,000 Units by mouth daily.     Marland Kitchen omeprazole (PRILOSEC) 20 MG capsule TAKE 1 CAPSULE BY MOUTH TWICE A DAY 180 capsule 1  . tretinoin (RETIN-A) 0.05 % cream tretinoin 0.05 % topical cream     . Wheat Dextrin (BENEFIBER) POWD Take 1 Dose by mouth daily.      No current facility-administered medications for this visit.    Allergies-reviewed and updated Allergies  Allergen Reactions  . Fish Allergy Swelling    Pt felt like throat was closing last time she ate   . Iodixanol Rash    Social History   Social History Narrative   Married. 1 daughter. No grandkids. 1 granddog and 1 grandcat.       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       Hobbies: walking 2.5 miles  [place], stays active   Objective  Objective:  BP 118/78 (BP Location: Left Arm, Patient Position: Sitting, Cuff Size: Normal)   Pulse 67   Temp 98.4 F (36.9 C) (Temporal)   Ht 5\' 7"  (1.702 m)   Wt 149 lb (67.6 kg)   SpO2 97%   BMI 23.34 kg/m  Gen: NAD, resting comfortably HEENT: Mucous membranes are moist. Oropharynx normal Neck: no thyromegaly CV: RRR no murmurs rubs or gallops Lungs: CTAB no crackles, wheeze, rhonchi Abdomen: soft/nontender/nondistended/normal bowel sounds. No rebound or guarding.  Ext: no edema Skin: warm, dry Neuro: grossly normal, moves all extremities, PERRLA   Assessment and Plan   69 y.o. female presenting for annual physical.  Health Maintenance counseling:  1. Anticipatory guidance: Patient counseled regarding regular dental exams -q6 months, eye exams - yearly,  avoiding smoking and second hand smoke , limiting alcohol to 1 beverage per day- doesn't drink .   2. Risk factor reduction:  Advised patient of need for regular exercise and diet rich and fruits and vegetables to reduce risk of heart attack and stroke. Exercise- walking around 2.5 miles daily and some home exercises. Diet-maintained weight during pandemic.  Wt Readings from Last 3 Encounters:  04/25/20 149 lb (67.6 kg)  12/29/19 149 lb (67.6 kg)  10/10/19 149 lb 9.6 oz (67.9 kg)  3. Immunizations/screenings/ancillary studies-discussed Pneumovax 23- wants  to do next year- as well as Shingrix- wait until next year.   Immunization History  Administered Date(s) Administered  . Influenza Whole 09/19/2007  . Influenza, High Dose Seasonal PF 09/09/2018, 08/22/2019  . Influenza,inj,Quad PF,6+ Mos 09/26/2013, 08/30/2014, 09/04/2015, 09/13/2017  . Influenza-Unspecified 09/22/2017, 09/09/2018, 08/22/2019  . Moderna SARS-COVID-2 Vaccination 01/03/2020, 02/01/2020  . Td 05/12/2004  . Tdap 04/22/2016  . Zoster 11/14/2014  4. Cervical cancer screening- followsj Dr. Benjie Karvonen 5. Breast cancer screening-  breast exam with Dr. Benjie Karvonen of gynecology and mammogram 04/08/20 with 1 year repeat 6. Colon cancer screening - 01/08/14 with 10-year follow-up  7. Skin cancer screening-follows  Yearly with dermatology Dr. Delman Cheadle with history of both basal and squamous cell carcinoma.advised regular sunscreen use. Denies worrisome, changing, or new skin lesions.  8. Birth control/STD check- postmenopausal and monogomous 9. Osteoporosis screening at 67-see discussion below -Never smoker  Status of chronic or acute concerns   #hyperlipidemia S: Medication: None directly for cholesterol.  Is on aspirin 81 mg Lab Results  Component Value Date   CHOL 196 02/13/2019   HDL 56.90 02/13/2019   LDLCALC 125 (H) 02/13/2019   TRIG 73.0 02/13/2019   CHOLHDL 3 02/13/2019   A/P: 10-year ASCVD risk based off of last years lipids is 5.2%-we discussed usually under 7.5% we do not start statin but will update lipids with labs today - will check with Dr. Donzetta Matters in July to see his thoughts on statin with popliteal artery issues though primarily due to this being tortuous- not sure how much statin will help   #Vitamin D deficiency S: Medication: vitamin D3 2000 units daily Last vitamin D Lab Results  Component Value Date   VD25OH 41.67 02/13/2019  A/P: Hopefully controlled-update vitamin D with labs today  # GERD S:Prilosec 20MG  twice daily-has failed once daily in the past  B12 levels  related to PPI use: does not take b12 Lab Results  Component Value Date   VITAMINB12 350 02/13/2019  A/P: Remains controlled-continue current medication - b12 check today  Popliteal artery occlusion, right ([place]) S: Last seen by Dr. Donzetta Matters of vascular surgery 12/29/19.  History of bypass surgery and had catheterization not requiring intervention 12/22/2018. Most recent imaging showed some diminished velocities but comparable to last year and plan is for 51-month repeat duplex and ABIs.  She will continue aspirin.  They did not recommend statin. No claudication A/P: stable- continue close follow up   Osteopenia of lumbar spine S: Patient with worst T score in lumbar spine -2.4 May 20, 2018.  Patient is compliant with vitamin D.  Weightbearing exercise:- regular as above  A/P: We will go ahead and have her update bone density-will need to be scheduled after June 21  #history of Thrombocytopenia-has been noted in prior years but on most recent check was normal.  Had seen hematology in the past with no further follow-up recommended  # Right hip replacement likely on left this time Dr. Elmyra Ricks. Able to complete 4 mets of activity without chest pain or shortness of breath- would not need more cardiac clearance  Recommended follow up: 1 year physical  Lab/Order associations: fasting   ICD-10-CM   1. Preventative health care  Z00.00 CBC with Differential/Platelet    Comprehensive metabolic panel    Lipid panel  2. Gastroesophageal reflux disease without esophagitis  K21.9   3. Hyperlipidemia, unspecified hyperlipidemia type  E78.5   4. Vitamin D deficiency  E55.9 VITAMIN D 25 Hydroxy (Vit-D Deficiency, Fractures)  5. Popliteal artery occlusion, right ([facility])  I70.201   6. Osteopenia of left femoral neck  M85.852 DG Bone Density  7. High risk medication use  Z79.899 Vitamin B12   Return precautions advised.  Garret Reddish, MD

## 2020-04-25 ENCOUNTER — Ambulatory Visit (INDEPENDENT_AMBULATORY_CARE_PROVIDER_SITE_OTHER): Payer: Medicare HMO | Admitting: Family Medicine

## 2020-04-25 ENCOUNTER — Ambulatory Visit: Payer: Medicare HMO

## 2020-04-25 ENCOUNTER — Encounter: Payer: Self-pay | Admitting: Family Medicine

## 2020-04-25 ENCOUNTER — Other Ambulatory Visit: Payer: Self-pay

## 2020-04-25 VITALS — BP 118/78 | HR 67 | Temp 98.4°F | Ht 67.0 in | Wt 149.0 lb

## 2020-04-25 DIAGNOSIS — I70201 Unspecified atherosclerosis of native arteries of extremities, right leg: Secondary | ICD-10-CM | POA: Diagnosis not present

## 2020-04-25 DIAGNOSIS — Z0000 Encounter for general adult medical examination without abnormal findings: Secondary | ICD-10-CM | POA: Diagnosis not present

## 2020-04-25 DIAGNOSIS — E559 Vitamin D deficiency, unspecified: Secondary | ICD-10-CM | POA: Diagnosis not present

## 2020-04-25 DIAGNOSIS — K219 Gastro-esophageal reflux disease without esophagitis: Secondary | ICD-10-CM | POA: Diagnosis not present

## 2020-04-25 DIAGNOSIS — E785 Hyperlipidemia, unspecified: Secondary | ICD-10-CM | POA: Diagnosis not present

## 2020-04-25 DIAGNOSIS — Z79899 Other long term (current) drug therapy: Secondary | ICD-10-CM | POA: Diagnosis not present

## 2020-04-25 DIAGNOSIS — M85852 Other specified disorders of bone density and structure, left thigh: Secondary | ICD-10-CM | POA: Diagnosis not present

## 2020-04-25 LAB — LIPID PANEL
Cholesterol: 192 mg/dL (ref 0–200)
HDL: 55.8 mg/dL (ref >39.00)
LDL Cholesterol: 121 mg/dL — ABNORMAL HIGH (ref 0–99)
NonHDL: 136.24
Total CHOL/HDL Ratio: 3
Triglycerides: 74 mg/dL (ref 0.0–149.0)
VLDL: 14.8 mg/dL (ref 0.0–40.0)

## 2020-04-25 LAB — CBC WITH DIFFERENTIAL/PLATELET
Basophils Absolute: 0 10*3/uL (ref 0.0–0.1)
Basophils Relative: 0.3 % (ref 0.0–3.0)
Eosinophils Absolute: 0.1 10*3/uL (ref 0.0–0.7)
Eosinophils Relative: 2.3 % (ref 0.0–5.0)
HCT: 43.1 % (ref 36.0–46.0)
Hemoglobin: 14.7 g/dL (ref 12.0–15.0)
Lymphocytes Relative: 37.6 % (ref 12.0–46.0)
Lymphs Abs: 1.6 10*3/uL (ref 0.7–4.0)
MCHC: 34.2 g/dL (ref 30.0–36.0)
MCV: 94.6 fl (ref 78.0–100.0)
Monocytes Absolute: 0.2 10*3/uL (ref 0.1–1.0)
Monocytes Relative: 5.2 % (ref 3.0–12.0)
Neutro Abs: 2.4 10*3/uL (ref 1.4–7.7)
Neutrophils Relative %: 54.6 % (ref 43.0–77.0)
Platelets: 165 10*3/uL (ref 150.0–400.0)
RBC: 4.55 Mil/uL (ref 3.87–5.11)
RDW: 13.3 % (ref 11.5–15.5)
WBC: 4.3 10*3/uL (ref 4.0–10.5)

## 2020-04-25 LAB — COMPREHENSIVE METABOLIC PANEL
ALT: 8 U/L (ref 0–35)
AST: 16 U/L (ref 0–37)
Albumin: 4.3 g/dL (ref 3.5–5.2)
Alkaline Phosphatase: 50 U/L (ref 39–117)
BUN: 13 mg/dL (ref 6–23)
CO2: 30 mEq/L (ref 19–32)
Calcium: 9.3 mg/dL (ref 8.4–10.5)
Chloride: 104 mEq/L (ref 96–112)
Creatinine, Ser: 0.65 mg/dL (ref 0.40–1.20)
GFR: 90.95 mL/min (ref >60.00)
Glucose, Bld: 86 mg/dL (ref 70–99)
Potassium: 4.6 mEq/L (ref 3.5–5.1)
Sodium: 140 mEq/L (ref 135–145)
Total Bilirubin: 0.5 mg/dL (ref 0.2–1.2)
Total Protein: 6.5 g/dL (ref 6.0–8.3)

## 2020-04-25 LAB — VITAMIN D 25 HYDROXY (VIT D DEFICIENCY, FRACTURES): VITD: 40.13 ng/mL (ref 30.00–100.00)

## 2020-04-25 LAB — VITAMIN B12: Vitamin B-12: 356 pg/mL (ref 211–911)

## 2020-04-25 NOTE — Progress Notes (Signed)
Phone: [phone]    Subjective:  Patient presents today for their annual wellness visit (initial )  Preventive Screening-Counseling & Management  Modifiable Risk Factors/behavioral risk assessment/psychosocial risk assessment Regular exercise: 2.5 miles five days a week Diet: healthy balanced diet  Wt Readings from Last 3 Encounters:  04/25/20 149 lb (67.6 kg)  12/29/19 149 lb (67.6 kg)  10/10/19 149 lb 9.6 oz (67.9 kg)   Smoking Status: Never Smoker Second Hand Smoking status: No smokers in home Alcohol intake: 0 per week  Cardiac risk factors:  advanced age (older than 90 for men, 24 for women)  untreated Hyperlipidemia - but under 7.5% ascvd risk no Hypertension  No diabetes.  Lab Results  Component Value Date   HGBA1C 5.6 12/01/2015  Family History: brother heart attack at 65   Depression Screen/risk evaluation Risk factors:  none.Marland Kitchen PHQ2 0  Depression screen [facility] 2/9 04/25/2020 02/13/2019 01/06/2018 04/22/2016  Decreased Interest 0 0 0 0  Down, Depressed, Hopeless 0 0 0 0  PHQ - 2 Score 0 0 0 0    Functional ability and level of safety Mobility assessment:  timed get up and go <12 seconds Activities of Daily Living- Independent in ADLs (toileting, bathing, dressing, transferring, eating) and in IADLs (shopping, housekeeping, managing own medications, and handling finances) Home Safety: Loose rugs (no), smoke detectors (up to date), small pets (no), grab bars (no but feels safe), stairs (14 in home- no issues), life-alert system (cell phone if needed) Hearing Difficulties: -patient declines Fall Risk: None  Fall Risk  04/25/2020 02/13/2019  Falls in the past year? 0 0  Number falls in past yr: 0 0  Injury with Fall? 0 0  Risk for fall due to : No Fall Risks -  Follow up Falls evaluation completed -   Opioid use history:  no long term opioids use Self assessment of health status: "really good"  Cognitive Testing             No reported trouble.   Mini cog:  normal clock draw. 1/3 delayed recall. Normal test result   List the Names of Other Physician/Practitioners you currently use: Patient Care Team: Marin Olp, MD as PCP - General (Family Medicine) Jari Pigg, MD as Consulting Physician (Dermatology) Milus Banister, MD as Attending Physician (Gastroenterology) Waynetta Sandy, MD as Consulting Physician (Vascular Surgery) Gaynelle Arabian, MD as Consulting Physician (Orthopedic Surgery)  Required Immunizations needed today:  shingrix and pneumovax but declines for now Immunization History  Administered Date(s) Administered  . Influenza Whole 09/19/2007  . Influenza, High Dose Seasonal PF 09/09/2018, 08/22/2019  . Influenza,inj,Quad PF,6+ Mos 09/26/2013, 08/30/2014, 09/04/2015, 09/13/2017  . Influenza-Unspecified 09/22/2017, 09/09/2018, 08/22/2019  . Moderna SARS-COVID-2 Vaccination 01/03/2020, 02/01/2020  . Td 05/12/2004  . Tdap 04/22/2016  . Zoster 11/14/2014   Health Maintenance  Topic Date Due  . Pneumonia vaccines (1 of 2 - PCV13) 04/25/2021*  . Flu Shot  06/30/2020  . Mammogram  04/08/2021  . Colon Cancer Screening  01/09/2024  . Tetanus Vaccine  04/22/2026  . DEXA scan (bone density measurement)  Completed  . COVID-19 Vaccine  Completed  .  Hepatitis C: One time screening is recommended by Center for Disease Control  (CDC) for  adults born from [birth year] through 1963.   Completed  *Topic was postponed. The date shown is not the original due date.    Screening tests-  1. Colon cancer screening- 12/2013 with 10 year repeat 2. Lung Cancer screening- not a candidate  3. Skin cancer screening- Dr. Delman Cheadle yearly 4. Cervical cancer screening- Dr. Benjie Karvonen yearly- passed age based screening 5. Breast cancer screening- up to date with mammogram this month   The following were reviewed and entered/updated in epic if appropriate: Past Medical History:  Diagnosis Date  . Anal fissure   . Arthritis    hip  . Atypical  chest pain   . Basal cell carcinoma   . Complication of anesthesia    BP drops and passes out  [place] vomiting  . Excess or deficiency of vitamin D   . Gallbladder polyp   . GERD (gastroesophageal reflux disease)   . Hemorrhoids   . Microhematuria    pt. denies  . Osteopenia   . PONV (postoperative nausea and vomiting)   . Thrombocytopenia [facility])    Patient Active Problem List   Diagnosis Date Noted  . Popliteal artery occlusion, right ([place]) 11/29/2015    Priority: High  . Vitamin D deficiency 05/20/2018    Priority: Medium  . History of skin cancer 04/22/2016    Priority: Medium  . Hyperlipidemia 04/22/2016    Priority: Medium  . Osteopenia of left femoral neck 09/18/2008    Priority: Medium  . GERD 07/10/2008    Priority: Medium  . Dupuytren's contracture of left hand 10/10/2019    Priority: Low  . OA (osteoarthritis) of hip 02/16/2018    Priority: Low  . Lumbar radiculopathy 11/20/2015    Priority: Low  . Gluteal tendinitis of left buttock 11/14/2015    Priority: Low  . Tensor fascia lata syndrome 10/08/2015    Priority: Low  . Hemorrhoids     Priority: Low  . Thrombosed external hemorrhoid 10/19/2014    Priority: Low  . Anal fissure - posterior 01/29/2014    Priority: Low  . CHEST PAIN 01/10/2009    Priority: Low  . BENIGN POSITIONAL VERTIGO 01/18/2008    Priority: Low   Past Surgical History:  Procedure Laterality Date  . ABDOMINAL AORTOGRAM W/LOWER EXTREMITY Right 12/22/2018   Procedure: ABDOMINAL AORTOGRAM W/LOWER EXTREMITY Runoff;  Surgeon: Waynetta Sandy, MD;  Location: [place] [facility];  Service: Cardiovascular;  Laterality: Right;  . COLONOSCOPY  2005, 2015  . INTRAOPERATIVE ARTERIOGRAM Right 11/29/2015   Procedure: INTRA OPERATIVE ARTERIOGRAM;  Surgeon: Conrad Ingham, MD;  Location: [place];  Service: Vascular;  Laterality: Right;  . JOINT REPLACEMENT     right total hip Aluisio 02-16-18  . LUNG BIOPSY    . THROMBECTOMY FEMORAL ARTERY  Right 11/29/2015   Procedure:  RIGHT  POPLITEAL to anterior tibial and posterior tibial ARTERY thrombectomy. Right above knee to below knee popliteal artery bypass;  Surgeon: Conrad W[place]burg, MD;  Location: [place];  Service: Vascular;  Laterality: Right;  . TOTAL HIP ARTHROPLASTY Right 02/16/2018   Procedure: RIGHT TOTAL HIP ARTHROPLASTY ANTERIOR APPROACH;  Surgeon: Gaynelle Arabian, MD;  [facility];  Service: Orthopedics;  Laterality: Right;  . UPPER GASTROINTESTINAL ENDOSCOPY  2009    Family History  Problem Relation Age of Onset  . COPD Mother   . Osteoporosis Mother   . Prostate cancer Father   . Heart disease Brother 51       Before age 41  . Heart attack Brother 42       Massive  . Lymphoma Brother        non and hodgkins  . Coronary artery disease Brother   . Colon cancer Neg Hx   . Colon polyps Neg Hx   .  Rectal cancer Neg Hx   . Stomach cancer Neg Hx     Medications- reviewed and updated Current Outpatient Medications  Medication Sig Dispense Refill  . acetaminophen (TYLENOL) 500 MG tablet Take 1,000 mg by mouth at bedtime as needed for moderate pain.     Marland Kitchen aspirin (ASPIRIN 81) 81 MG chewable tablet Chew 81 mg by mouth daily.     . Carboxymethylcellul-Glycerin (LUBRICATING EYE DROPS OP) Place 1 drop into both eyes daily.    . Cholecalciferol (VITAMIN D3) 25 MCG (1000 UT) CAPS Take 2,000 Units by mouth daily.     Marland Kitchen omeprazole (PRILOSEC) 20 MG capsule TAKE 1 CAPSULE BY MOUTH TWICE A DAY 180 capsule 1  . tretinoin (RETIN-A) 0.05 % cream tretinoin 0.05 % topical cream    . Wheat Dextrin (BENEFIBER) POWD Take 1 Dose by mouth daily.      No current facility-administered medications for this visit.    Allergies-reviewed and updated Allergies  Allergen Reactions  . Fish Allergy Swelling    Pt felt like throat was closing last time she ate   . Iodixanol Rash    Social History   Socioeconomic History  . Marital status: Married    Spouse name: Not on file  . Number of  children: 1  . Years of education: Not on file  . Highest education level: Not on file  Occupational History  . Occupation: Retired  Tobacco Use  . Smoking status: Never Smoker  . Smokeless tobacco: Never Used  Substance and Sexual Activity  . Alcohol use: No    Alcohol/week: 0.0 standard drinks  . Drug use: No  . Sexual activity: Yes  Other Topics Concern  . Not on file  Social History Narrative   Married. 1 daughter. No grandkids. 1 granddog and 1 grandcat.       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       Hobbies: walking 2.5 miles  [place], stays active   Social Determinants of Health   Financial Resource Strain:   . Difficulty of Paying Living Expenses:   Food Insecurity:   . Worried About Charity fundraiser in the Last Year:   . Arboriculturist in the Last Year:   Transportation Needs:   . Film/video editor (Medical):   Marland Kitchen Lack of Transportation (Non-Medical):   Physical Activity:   . Days of Exercise per Week:   . Minutes of Exercise per Session:   Stress:   . Feeling of Stress :   Social Connections:   . Frequency of Communication with Friends and Family:   . Frequency of Social Gatherings with Friends and Family:   . Attends Religious Services:   . Active Member of Clubs or Organizations:   . Attends Archivist Meetings:   Marland Kitchen Marital Status:       Objective:  BP 118/78 (BP Location: Left Arm, Patient Position: Sitting, Cuff Size: Normal)   Pulse 67   Temp 98.4 F (36.9 C) (Temporal)   Ht 5\' 7"  (1.702 m)   Wt 149 lb (67.6 kg)   SpO2 97%   BMI 23.34 kg/m  Gen: NAD, resting comfortably   Assessment/Plan:  AWV completed 1. Educated, counseled and referred based on above elements 2. Educated, counseled and referred as appropriate for preventative needs 3. Discussed and documented a written plan for preventiative services and screenings with personalized health advice- After Visit  Summary was  given to patient which included this plan   Status of chronic or acute concerns  See physical form  Recommended follow up: Return in about 1 year (around 04/25/2021) for CPE. Future Appointments  Date Time Provider [place]  04/25/2021 11:00 AM Marin Olp, MD [facility]   Lab/Order associations:   ICD-10-CM  1. Preventative health care  Z00.00   Return precautions advised. Garret Reddish, MD

## 2020-05-16 ENCOUNTER — Other Ambulatory Visit: Payer: Self-pay

## 2020-05-16 ENCOUNTER — Ambulatory Visit (INDEPENDENT_AMBULATORY_CARE_PROVIDER_SITE_OTHER)
Admission: RE | Admit: 2020-05-16 | Discharge: 2020-05-16 | Disposition: A | Discharge status: Home / Self Care | Payer: Medicare HMO | Source: Ambulatory Visit | Attending: Family Medicine | Admitting: Family Medicine

## 2020-05-16 DIAGNOSIS — M85852 Other specified disorders of bone density and structure, left thigh: Secondary | ICD-10-CM | POA: Diagnosis not present

## 2020-05-16 DIAGNOSIS — M1612 Unilateral primary osteoarthritis, left hip: Secondary | ICD-10-CM | POA: Diagnosis not present

## 2020-05-16 DIAGNOSIS — Z471 Aftercare following joint replacement surgery: Secondary | ICD-10-CM | POA: Diagnosis not present

## 2020-05-16 DIAGNOSIS — Z96641 Presence of right artificial hip joint: Secondary | ICD-10-CM | POA: Diagnosis not present

## 2020-05-16 DIAGNOSIS — M25511 Pain in right shoulder: Secondary | ICD-10-CM | POA: Diagnosis not present

## 2020-05-22 DIAGNOSIS — N898 Other specified noninflammatory disorders of vagina: Secondary | ICD-10-CM | POA: Diagnosis not present

## 2020-05-22 DIAGNOSIS — Z7982 Long term (current) use of aspirin: Secondary | ICD-10-CM | POA: Diagnosis not present

## 2020-05-22 DIAGNOSIS — R030 Elevated blood-pressure reading, without diagnosis of hypertension: Secondary | ICD-10-CM | POA: Diagnosis not present

## 2020-05-22 DIAGNOSIS — G8929 Other chronic pain: Secondary | ICD-10-CM | POA: Diagnosis not present

## 2020-05-22 DIAGNOSIS — Z008 Encounter for other general examination: Secondary | ICD-10-CM | POA: Diagnosis not present

## 2020-05-22 DIAGNOSIS — R32 Unspecified urinary incontinence: Secondary | ICD-10-CM | POA: Diagnosis not present

## 2020-05-22 DIAGNOSIS — Z825 Family history of asthma and other chronic lower respiratory diseases: Secondary | ICD-10-CM | POA: Diagnosis not present

## 2020-05-22 DIAGNOSIS — M1990 Unspecified osteoarthritis, unspecified site: Secondary | ICD-10-CM | POA: Diagnosis not present

## 2020-05-22 DIAGNOSIS — Z803 Family history of malignant neoplasm of breast: Secondary | ICD-10-CM | POA: Diagnosis not present

## 2020-05-22 DIAGNOSIS — K219 Gastro-esophageal reflux disease without esophagitis: Secondary | ICD-10-CM | POA: Diagnosis not present

## 2020-05-22 DIAGNOSIS — Z8249 Family history of ischemic heart disease and other diseases of the circulatory system: Secondary | ICD-10-CM | POA: Diagnosis not present

## 2020-05-23 ENCOUNTER — Inpatient Hospital Stay: Admission: RE | Admit: 2020-05-23 | Payer: Medicare HMO | Source: Ambulatory Visit

## 2020-05-31 ENCOUNTER — Encounter: Payer: Self-pay | Admitting: Family Medicine

## 2020-06-04 NOTE — Telephone Encounter (Signed)
We have received form and placed in your folder for review.

## 2020-06-19 ENCOUNTER — Encounter (HOSPITAL_COMMUNITY): Payer: Self-pay

## 2020-06-19 NOTE — Progress Notes (Addendum)
COVID Vaccine Completed: x2 Date COVID Vaccine completed: 01-03-20 COVID vaccine manufacturer: Moderna      PCP - Garret Reddish, MD Cardiologist - Last seen 5+ years  Clearance on chart   Chest x-ray - N/A EKG - N/A Stress Test - ECHO stress 10-10-15 ECHO - N/A Cardiac Cath - N/A  Sleep Study - N/A CPAP - N/A  Fasting Blood Sugar - N/A Checks Blood Sugar _____ times a day  Blood Thinner Instructions: Aspirin Instructions: ASA 81 mg.  Stop 7 days before surgery per patient.. Last Dose:  Anesthesia review: PVD and popliteal artery occlusion with graft  Patient denies shortness of breath, fever, cough and chest pain at PAT appointment   Patient verbalized understanding of instructions that were given to them at the PAT appointment. Patient was also instructed that they will need to review over the PAT instructions again at home before surgery.

## 2020-06-19 NOTE — Patient Instructions (Addendum)
DUE TO COVID-19 ONLY ONE VISITOR ARE ALLOWED TO COME WITH YOU AND STAY IN THE WAITING ROOM ONLY DURING PRE OP AND PROCEDURE. THEN TWO VISITORS MAY VISIT WITH YOU IN YOUR PRIVATE ROOM DURING VISITING HOURS ONLY!! (10AM-8PM)   COVID SWAB TESTING MUST BE COMPLETED ON:     06-29-20 @ 12 Noon  [address], [place] -Former [facility] enter pre surgical testing line (Must self quarantine after testing. Follow instructions on handout.)              Your procedure is scheduled on: 07-03-20   Report to [facility] Main  Entrance    Report to admitting at 6:30 AM   Call this number if you have problems the morning of surgery [phone]   Do not eat food :After Midnight.   May have liquids until 5:30 AM day of surgery  CLEAR LIQUID DIET  Foods Allowed                                                                     Foods Excluded  Water, Black Coffee and tea, regular and decaf              liquids that you cannot  Plain Jell-O in any flavor  (No red)                                     see through such as: Fruit ices (not with fruit pulp)                                      milk, soups, orange juice  Iced Popsicles (No red)                                     All solid food                                   Apple juices Sports drinks like Gatorade (No red) Lightly seasoned clear broth or consume(fat free) Sugar, honey syrup  Sample Menu Breakfast                                Lunch                                     Supper Cranberry juice                    Beef broth                            Chicken broth Jell-O  Grape juice                           Apple juice Coffee or tea                        Jell-O                                      Popsicle                                                Coffee or tea                        Coffee or tea   Complete one Ensure drink the morning of surgery at 5:30 AM the day  of surgery.     Oral Hygiene is also important to reduce your risk of infection.                                     Remember - BRUSH YOUR TEETH THE MORNING OF SURGERY WITH YOUR REGULAR  TOOTHPASTE   Do NOT smoke after Midnight   Take these medicines the morning of surgery with A SIP OF WATER: Omeprazole (Prilosec)                               You may not have any metal on your body including hair pins, jewelry, and body piercings              Do not wear make-up, lotions, powders, perfumes, or deodorant              Do not wear nail polish.  Do not shave  48 hours prior to surgery.                 Do not bring valuables to the hospital. [place].   Contacts, dentures or bridgework may not be worn into surgery.   Bring small overnight bag day of surgery.    Special Instructions: Bring a copy of your healthcare power of attorney and living will documents          the day of surgery if you haven't scanned them in before.              Please read over the following fact sheets you were given: IF YOU HAVE QUESTIONS ABOUT YOUR  PRE OP INSTRUCTIONS PLEASE CALL [phone]   [place] - Preparing for Surgery Before surgery, you can play an important role.  Because skin is not sterile, your skin needs to be as free of germs as possible.  You can reduce the number of germs on your skin by washing with CHG (chlorahexidine gluconate) soap before surgery.  CHG is an antiseptic cleaner which kills germs and bonds with the skin to continue killing germs even after washing. Please DO NOT use if you have an allergy to CHG or antibacterial soaps.  If your skin becomes reddened/irritated stop using the CHG and inform  your nurse when you arrive at Short Stay. Do not shave (including legs and underarms) for at least 48 hours prior to the first CHG shower.  You may shave your face/neck.  Please follow these instructions carefully:  1.  Shower with CHG Soap the  night before surgery and the  morning of surgery.  2.  If you choose to wash your hair, wash your hair first as usual with your normal  shampoo.  3.  After you shampoo, rinse your hair and body thoroughly to remove the shampoo.                             4.  Use CHG as you would any other liquid soap.  You can apply chg directly to the skin and wash.  Gently with a scrungie or clean washcloth.  5.  Apply the CHG Soap to your body ONLY FROM THE NECK DOWN.   Do   not use on face/ open                           Wound or open sores. Avoid contact with eyes, ears mouth and   genitals (private parts).                       Wash face,  Genitals (private parts) with your normal soap.             6.  Wash thoroughly, paying special attention to the area where your    surgery  will be performed.  7.  Thoroughly rinse your body with warm water from the neck down.  8.  DO NOT shower/wash with your normal soap after using and rinsing off the CHG Soap.                9.  Pat yourself dry with a clean towel.            10.  Wear clean pajamas.            11.  Place clean sheets on your bed the night of your first shower and do not  sleep with pets. Day of Surgery : Do not apply any lotions/deodorants the morning of surgery.  Please wear clean clothes to the hospital/surgery center.  FAILURE TO FOLLOW THESE INSTRUCTIONS MAY RESULT IN THE CANCELLATION OF YOUR SURGERY  PATIENT SIGNATURE_________________________________  NURSE SIGNATURE__________________________________  ________________________________________________________________________   Elizabeth Douglas  An incentive spirometer is a tool that can help keep your lungs clear and active. This tool measures how well you are filling your lungs with each breath. Taking long deep breaths may help reverse or decrease the chance of developing breathing (pulmonary) problems (especially infection) following:  A long period of time when you are unable to  move or be active. BEFORE THE PROCEDURE   If the spirometer includes an indicator to show your best effort, your nurse or respiratory therapist will set it to a desired goal.  If possible, sit up straight or lean slightly forward. Try not to slouch.  Hold the incentive spirometer in an upright position. INSTRUCTIONS FOR USE  1. Sit on the edge of your bed if possible, or sit up as far as you can in bed or on a chair. 2. Hold the incentive spirometer in an upright position. 3. Breathe out normally. 4. Place the mouthpiece in your mouth and seal your  lips tightly around it. 5. Breathe in slowly and as deeply as possible, raising the piston or the ball toward the top of the column. 6. Hold your breath for 3-5 seconds or for as long as possible. Allow the piston or ball to fall to the bottom of the column. 7. Remove the mouthpiece from your mouth and breathe out normally. 8. Rest for a few seconds and repeat Steps 1 through 7 at least 10 times every 1-2 hours when you are awake. Take your time and take a few normal breaths between deep breaths. 9. The spirometer may include an indicator to show your best effort. Use the indicator as a goal to work toward during each repetition. 10. After each set of 10 deep breaths, practice coughing to be sure your lungs are clear. If you have an incision (the cut made at the time of surgery), support your incision when coughing by placing a pillow or rolled up towels firmly against it. Once you are able to get out of bed, walk around indoors and cough well. You may stop using the incentive spirometer when instructed by your caregiver.  RISKS AND COMPLICATIONS  Take your time so you do not get dizzy or light-headed.  If you are in pain, you may need to take or ask for pain medication before doing incentive spirometry. It is harder to take a deep breath if you are having pain. AFTER USE  Rest and breathe slowly and easily.  It can be helpful to keep track of  a log of your progress. Your caregiver can provide you with a simple table to help with this. If you are using the spirometer at home, follow these instructions: [place] IF:   You are having difficultly using the spirometer.  You have trouble using the spirometer as often as instructed.  Your pain medication is not giving enough relief while using the spirometer.  You develop fever of 100.5 F (38.1 C) or higher. SEEK IMMEDIATE MEDICAL CARE IF:   You cough up bloody sputum that had not been present before.  You develop fever of 102 F (38.9 C) or greater.  You develop worsening pain at or near the incision site. MAKE SURE YOU:   Understand these instructions.  Will watch your condition.  Will get help right away if you are not doing well or get worse. Document Released: 03/29/2007 Document Revised: 02/08/2012 Document Reviewed: 05/30/2007 [facility] Patient Information 2014 [place], [place].   WHAT IS A BLOOD TRANSFUSION? Blood Transfusion Information  A transfusion is the replacement of blood or some of its parts. Blood is made up of multiple cells which provide different functions.  Red blood cells carry oxygen and are used for blood loss replacement.  White blood cells fight against infection.  Platelets control bleeding.  Plasma helps clot blood.  Other blood products are available for specialized needs, such as hemophilia or other clotting disorders. BEFORE THE TRANSFUSION  Who gives blood for transfusions?   Healthy volunteers who are fully evaluated to make sure their blood is safe. This is blood bank blood. Transfusion therapy is the safest it has ever been in the practice of medicine. Before blood is taken from a donor, a complete history is taken to make sure that person has no history of diseases nor engages in risky social behavior (examples are intravenous drug use or sexual activity with multiple partners). The donor's travel history is screened to  minimize risk of transmitting infections, such as malaria. The  donated blood is tested for signs of infectious diseases, such as HIV and hepatitis. The blood is then tested to be sure it is compatible with you in order to minimize the chance of a transfusion reaction. If you or a relative donates blood, this is often done in anticipation of surgery and is not appropriate for emergency situations. It takes many days to process the donated blood. RISKS AND COMPLICATIONS Although transfusion therapy is very safe and saves many lives, the main dangers of transfusion include:   Getting an infectious disease.  Developing a transfusion reaction. This is an allergic reaction to something in the blood you were given. Every precaution is taken to prevent this. The decision to have a blood transfusion has been considered carefully by your caregiver before blood is given. Blood is not given unless the benefits outweigh the risks. AFTER THE TRANSFUSION  Right after receiving a blood transfusion, you will usually feel much better and more energetic. This is especially true if your red blood cells have gotten low (anemic). The transfusion raises the level of the red blood cells which carry oxygen, and this usually causes an energy increase.  The nurse administering the transfusion will monitor you carefully for complications. HOME CARE INSTRUCTIONS  No special instructions are needed after a transfusion. You may find your energy is better. Speak with your caregiver about any limitations on activity for underlying diseases you may have. SEEK MEDICAL CARE IF:   Your condition is not improving after your transfusion.  You develop redness or irritation at the intravenous (IV) site. SEEK IMMEDIATE MEDICAL CARE IF:  Any of the following symptoms occur over the next 12 hours:  Shaking chills.  You have a temperature by mouth above 102 F (38.9 C), not controlled by medicine.  Chest, back, or muscle  pain.  People around you feel you are not acting correctly or are confused.  Shortness of breath or difficulty breathing.  Dizziness and fainting.  You get a rash or develop hives.  You have a decrease in urine output.  Your urine turns a dark color or changes to pink, red, or brown. Any of the following symptoms occur over the next 10 days:  You have a temperature by mouth above 102 F (38.9 C), not controlled by medicine.  Shortness of breath.  Weakness after normal activity.  The white part of the eye turns yellow (jaundice).  You have a decrease in the amount of urine or are urinating less often.  Your urine turns a dark color or changes to pink, red, or brown. Document Released: 11/13/2000 Document Revised: 02/08/2012 Document Reviewed: 07/02/2008 [facility] Patient Information 2014 [place], [place].  _______________________________________________________________________  ________________________________________________________________________

## 2020-06-24 ENCOUNTER — Encounter (HOSPITAL_COMMUNITY)
Admission: RE | Admit: 2020-06-24 | Discharge: 2020-06-24 | Disposition: A | Discharge status: Home / Self Care | Payer: Medicare HMO | Source: Ambulatory Visit | Attending: Orthopedic Surgery | Admitting: Orthopedic Surgery

## 2020-06-24 ENCOUNTER — Encounter (HOSPITAL_COMMUNITY): Payer: Self-pay

## 2020-06-24 ENCOUNTER — Other Ambulatory Visit: Payer: Self-pay

## 2020-06-24 DIAGNOSIS — Z01812 Encounter for preprocedural laboratory examination: Secondary | ICD-10-CM | POA: Insufficient documentation

## 2020-06-24 HISTORY — DX: Acute embolism and thrombosis of unspecified deep veins of unspecified lower extremity: I82.409

## 2020-06-24 HISTORY — DX: Peripheral vascular disease, unspecified: I73.9

## 2020-06-24 LAB — COMPREHENSIVE METABOLIC PANEL
ALT: 12 U/L (ref 0–44)
AST: 20 U/L (ref 15–41)
Albumin: 4.3 g/dL (ref 3.5–5.0)
Alkaline Phosphatase: 45 U/L (ref 38–126)
Anion gap: 8 (ref 5–15)
BUN: 9 mg/dL (ref 8–23)
CO2: 29 mmol/L (ref 22–32)
Calcium: 9.3 mg/dL (ref 8.9–10.3)
Chloride: 105 mmol/L (ref 98–111)
Creatinine, Ser: 0.51 mg/dL (ref 0.44–1.00)
GFR calc Af Amer: >60 mL/min (ref >60)
GFR calc non Af Amer: >60 mL/min (ref >60)
Glucose, Bld: 90 mg/dL (ref 70–99)
Potassium: 4.2 mmol/L (ref 3.5–5.1)
Sodium: 142 mmol/L (ref 135–145)
Total Bilirubin: 0.4 mg/dL (ref 0.3–1.2)
Total Protein: 7.1 g/dL (ref 6.5–8.1)

## 2020-06-24 LAB — PROTIME-INR
INR: 1 (ref 0.8–1.2)
Prothrombin Time: 12.9 seconds (ref 11.4–15.2)

## 2020-06-24 LAB — CBC
HCT: 43.1 % (ref 36.0–46.0)
Hemoglobin: 14.2 g/dL (ref 12.0–15.0)
MCH: 31.9 pg (ref 26.0–34.0)
MCHC: 32.9 g/dL (ref 30.0–36.0)
MCV: 96.9 fL (ref 80.0–100.0)
Platelets: 154 10*3/uL (ref 150–400)
RBC: 4.45 MIL/uL (ref 3.87–5.11)
RDW: 12.2 % (ref 11.5–15.5)
WBC: 4.8 10*3/uL (ref 4.0–10.5)
nRBC: 0 % (ref 0.0–0.2)

## 2020-06-24 LAB — APTT: aPTT: 29 seconds (ref 24–36)

## 2020-06-24 LAB — SURGICAL PCR SCREEN
MRSA, PCR: NEGATIVE
Staphylococcus aureus: NEGATIVE

## 2020-06-26 NOTE — Anesthesia Preprocedure Evaluation (Addendum)
Anesthesia Evaluation  Patient identified by MRN, date of birth, ID band Patient awake    Reviewed: Allergy & Precautions, NPO status , Patient's Chart, lab work & pertinent test results  History of Anesthesia Complications (+) PONVNegative for: history of anesthetic complications  Airway Mallampati: II  TM Distance: >3 FB Neck ROM: Full    Dental   Pulmonary neg pulmonary ROS,    Pulmonary exam normal        Cardiovascular Exercise Tolerance: Good + Peripheral Vascular Disease  Normal cardiovascular exam     Neuro/Psych negative neurological ROS  negative psych ROS   GI/Hepatic Neg liver ROS, GERD  ,  Endo/Other  negative endocrine ROS  Renal/GU negative Renal ROS  negative genitourinary   Musculoskeletal  (+) Arthritis , Osteoarthritis,    Abdominal   Peds  Hematology negative hematology ROS (+)   Anesthesia Other Findings  INR 1.0, plts 154, no AC  Echo 2017: Mild LVH with LVEF 60-65% and normal diastolic function. Trivial mitral and tricuspid regurgitation.   Reproductive/Obstetrics                           Anesthesia Physical Anesthesia Plan  ASA: II  Anesthesia Plan: Spinal   Post-op Pain Management:    Induction:   PONV Risk Score and Plan: 3 and Propofol infusion, Treatment may vary due to age or medical condition, Ondansetron and TIVA  Airway Management Planned: Nasal Cannula and Simple Face Mask  Additional Equipment: None  Intra-op Plan:   Post-operative Plan:   Informed Consent: I have reviewed the patients History and Physical, chart, labs and discussed the procedure including the risks, benefits and alternatives for the proposed anesthesia with the patient or authorized representative who has indicated his/her understanding and acceptance.       Plan Discussed with:   Anesthesia Plan Comments: (Per PCP 04/25/2020, "Able to complete 4 mets of activity  without chest pain or shortness of breath- would not need more cardiac clearance")       Anesthesia Quick Evaluation

## 2020-06-29 ENCOUNTER — Other Ambulatory Visit (HOSPITAL_COMMUNITY)
Admission: RE | Admit: 2020-06-29 | Discharge: 2020-06-29 | Disposition: A | Discharge status: Home / Self Care | Payer: Medicare HMO | Source: Ambulatory Visit | Attending: Orthopedic Surgery | Admitting: Orthopedic Surgery

## 2020-06-29 DIAGNOSIS — Z20822 Contact with and (suspected) exposure to covid-19: Secondary | ICD-10-CM | POA: Insufficient documentation

## 2020-06-29 DIAGNOSIS — Z01812 Encounter for preprocedural laboratory examination: Secondary | ICD-10-CM | POA: Insufficient documentation

## 2020-06-29 LAB — SARS CORONAVIRUS 2 (TAT 6-24 HRS): SARS Coronavirus 2: NEGATIVE

## 2020-07-02 NOTE — H&P (Signed)
TOTAL HIP ADMISSION H&P  Patient is admitted for left total hip arthroplasty.  Subjective:  Chief Complaint: left hip pain  HPI: Elizabeth Douglas, 69 y.o. female, has a history of pain and functional disability in the left hip(s) due to arthritis and patient has failed non-surgical conservative treatments for greater than 12 weeks to include NSAID's and/or analgesics and activity modification.  Onset of symptoms was gradual starting 2 years ago with gradually worsening course since that time.The patient noted no past surgery on the left hip(s).  Patient currently rates pain in the left hip at 8 out of 10 with activity. Patient has worsening of pain with activity and weight bearing and pain that interfers with activities of daily living. Patient has evidence of joint space narrowing by imaging studies. This condition presents safety issues increasing the risk of falls.  There is no current active infection.  Patient Active Problem List   Diagnosis Date Noted  . Dupuytren's contracture of left hand 10/10/2019  . Vitamin D deficiency 05/20/2018  . OA (osteoarthritis) of hip 02/16/2018  . History of skin cancer 04/22/2016  . Hyperlipidemia 04/22/2016  . Popliteal artery occlusion, right ([place]) 11/29/2015  . Lumbar radiculopathy 11/20/2015  . Gluteal tendinitis of left buttock 11/14/2015  . Tensor fascia lata syndrome 10/08/2015  . Hemorrhoids   . Thrombosed external hemorrhoid 10/19/2014  . Anal fissure - posterior 01/29/2014  . CHEST PAIN 01/10/2009  . Osteopenia of left femoral neck 09/18/2008  . GERD 07/10/2008  . BENIGN POSITIONAL VERTIGO 01/18/2008   Past Medical History:  Diagnosis Date  . Anal fissure   . Arthritis    hip  . Atypical chest pain   . Basal cell carcinoma    Skin cancer face  . Complication of anesthesia    BP drops and passes out  [place] vomiting  . DVT (deep venous thrombosis) (HCC)    Right Leg  . Excess or deficiency of vitamin D   . Gallbladder polyp   .  GERD (gastroesophageal reflux disease)   . Hemorrhoids   . Microhematuria    pt. denies  . Osteopenia   . Peripheral vascular disease ([place])   . PONV (postoperative nausea and vomiting)   . Thrombocytopenia ([place])     Past Surgical History:  Procedure Laterality Date  . ABDOMINAL AORTOGRAM W/LOWER EXTREMITY Right 12/22/2018   Procedure: ABDOMINAL AORTOGRAM W/LOWER EXTREMITY [place];  Surgeon: Waynetta Sandy, MD;  Location: [facility];  Service: Cardiovascular;  Laterality: Right;  . COLONOSCOPY  2005, 2015  . INTRAOPERATIVE ARTERIOGRAM Right 11/29/2015   Procedure: INTRA OPERATIVE ARTERIOGRAM;  Surgeon: Conrad Jasper, MD;  Location: [facility];  Service: Vascular;  Laterality: Right;  . JOINT REPLACEMENT     right total hip [place] 02-16-18  . LUNG BIOPSY    . THROMBECTOMY FEMORAL ARTERY Right 11/29/2015   Procedure:  RIGHT  POPLITEAL to anterior tibial and posterior tibial ARTERY thrombectomy. Right above knee to below knee popliteal artery bypass;  Surgeon: Conrad Franklin, MD;  Location: [facility];  Service: Vascular;  Laterality: Right;  . TOTAL HIP ARTHROPLASTY Right 02/16/2018   Procedure: RIGHT TOTAL HIP ARTHROPLASTY ANTERIOR APPROACH;  Surgeon: Gaynelle Arabian, MD;  Location: [facility];  Service: Orthopedics;  Laterality: Right;  . UPPER GASTROINTESTINAL ENDOSCOPY  2009    No current facility-administered medications for this encounter.   Current Outpatient Medications  Medication Sig Dispense Refill Last Dose  . acetaminophen (TYLENOL) 500 MG tablet Take 1,000 mg by  mouth at bedtime as needed for moderate pain.      Marland Kitchen aspirin (ASPIRIN 81) 81 MG chewable tablet Chew 81 mg by mouth daily.      . Calcium Carbonate-Simethicone (MAALOX MAX) 1000-60 MG CHEW Chew 1 each by mouth daily as needed (heartburn).     . Carboxymethylcellul-Glycerin (LUBRICATING EYE DROPS OP) Place 1 drop into both eyes daily.     . Cholecalciferol (VITAMIN D3) 50 MCG (2000 UT) capsule Take 2,000 Units by  mouth daily.      Marland Kitchen omeprazole (PRILOSEC) 20 MG capsule TAKE 1 CAPSULE BY MOUTH TWICE A DAY (Patient taking differently: Take 20 mg by mouth daily. ) 180 capsule 1   . Wheat Dextrin (BENEFIBER) POWD Take 1 Dose by mouth daily.       Allergies  Allergen Reactions  . Fish Allergy Anaphylaxis and Swelling    Pt felt like throat was closing last time she ate   . Iodixanol Rash    Contrast Dye     Social History   Tobacco Use  . Smoking status: Never Smoker  . Smokeless tobacco: Never Used  Substance Use Topics  . Alcohol use: No    Alcohol/week: 0.0 standard drinks    Family History  Problem Relation Age of Onset  . COPD Mother   . Osteoporosis Mother   . Prostate cancer Father   . Heart disease Brother 62       Before age 64  . Heart attack Brother 42       Massive  . Lymphoma Brother        non and hodgkins  . Coronary artery disease Brother   . Colon cancer Neg Hx   . Colon polyps Neg Hx   . Rectal cancer Neg Hx   . Stomach cancer Neg Hx      Review of Systems  Constitutional: Negative for chills and fever.  Respiratory: Negative for cough and shortness of breath.   Cardiovascular: Negative for chest pain.  Gastrointestinal: Negative for nausea and vomiting.  Musculoskeletal: Positive for arthralgias.    Objective:  Physical Exam Patient is a 69 year old female.  Well nourished and well developed. General: Alert and oriented x3, cooperative and pleasant, no acute distress. Head: normocephalic, atraumatic, neck supple. Eyes: EOMI. Respiratory: breath sounds clear in all fields, no wheezing, rales, or rhonchi. Cardiovascular: Regular rate and rhythm, no murmurs, gallops or rubs.  Musculoskeletal: Left Hip Exam: ROM: Flexion to 120, Internal Rotation 0, External Rotation 20, and abduction 20 with discomfort. There is positive tenderness over the greater trochanter bursa.  Calves soft and nontender. Motor function intact in LE. Strength 5/5 LE  bilaterally. Neuro: Distal pulses 2+. Sensation to light touch intact in LE. Vital signs in last 24 hours:    Labs:   Estimated body mass index is 23.52 kg/m as calculated from the following:   Height as of 06/24/20: 5\' 7"  (1.702 m).   Weight as of 06/24/20: 68.1 kg.   Imaging Review Plain radiographs demonstrate severe degenerative joint disease of the left hip(s). The bone quality appears to be adequate for age and reported activity level.      Assessment/Plan:  End stage arthritis, left hip(s)  The patient history, physical examination, clinical judgement of the provider and imaging studies are consistent with end stage degenerative joint disease of the left hip(s) and total hip arthroplasty is deemed medically necessary. The treatment options including medical management, injection therapy, arthroscopy and arthroplasty were discussed at length. The  risks and benefits of total hip arthroplasty were presented and reviewed. The risks due to aseptic loosening, infection, stiffness, dislocation/subluxation,  thromboembolic complications and other imponderables were discussed.  The patient acknowledged the explanation, agreed to proceed with the plan and consent was signed. Patient is being admitted for inpatient treatment for surgery, pain control, PT, OT, prophylactic antibiotics, VTE prophylaxis, progressive ambulation and ADL's and discharge planning.The patient is planning to be discharged home.   Therapy Plans: HEP Disposition: Home with husband Planned DVT Prophylaxis: Xarelto 10mg  daily DME needed: none PCP: Dr. Yong Channel, clearance received TXA: IV Allergies: Contrast - itching/flushing, Fish containing products - throat swelling Anesthesia Concerns: none BMI: 23.5 Not diabetic.  Other: Hx of DVT in right leg in 2016. Had issues with nausea, hypotension, dizziness after right THA 2 years ago. Does not tolerate opioids well, would prefer tylenol & tramadol.  - Patient was  instructed on what medications to stop prior to surgery. - Follow-up visit in 2 weeks with Dr. Wynelle Link - Begin physical therapy following surgery - Pre-operative lab work as pre-surgical testing - Prescriptions will be provided in hospital at time of discharge  Griffith Citron, PA-C Orthopedic Surgery [facility] [phone]

## 2020-07-03 ENCOUNTER — Ambulatory Visit (HOSPITAL_COMMUNITY): Payer: Medicare HMO

## 2020-07-03 ENCOUNTER — Other Ambulatory Visit: Payer: Self-pay

## 2020-07-03 ENCOUNTER — Encounter (HOSPITAL_COMMUNITY)
Admission: RE | Disposition: A | Discharge status: Home / Self Care | Payer: Self-pay | Source: Ambulatory Visit | Attending: Orthopedic Surgery

## 2020-07-03 ENCOUNTER — Ambulatory Visit (HOSPITAL_COMMUNITY)
Admission: RE | Admit: 2020-07-03 | Discharge: 2020-07-04 | Disposition: A | Discharge status: Home / Self Care | Payer: Medicare HMO | Source: Ambulatory Visit | Attending: Orthopedic Surgery | Admitting: Orthopedic Surgery

## 2020-07-03 ENCOUNTER — Ambulatory Visit (HOSPITAL_COMMUNITY): Payer: Medicare HMO | Admitting: Certified Registered"

## 2020-07-03 ENCOUNTER — Ambulatory Visit (HOSPITAL_COMMUNITY): Payer: Medicare HMO | Admitting: Physician Assistant

## 2020-07-03 ENCOUNTER — Encounter (HOSPITAL_COMMUNITY): Payer: Self-pay | Admitting: Orthopedic Surgery

## 2020-07-03 DIAGNOSIS — M1612 Unilateral primary osteoarthritis, left hip: Secondary | ICD-10-CM | POA: Diagnosis not present

## 2020-07-03 DIAGNOSIS — Z7982 Long term (current) use of aspirin: Secondary | ICD-10-CM | POA: Diagnosis not present

## 2020-07-03 DIAGNOSIS — Z96641 Presence of right artificial hip joint: Secondary | ICD-10-CM | POA: Diagnosis not present

## 2020-07-03 DIAGNOSIS — I081 Rheumatic disorders of both mitral and tricuspid valves: Secondary | ICD-10-CM | POA: Diagnosis not present

## 2020-07-03 DIAGNOSIS — Z8249 Family history of ischemic heart disease and other diseases of the circulatory system: Secondary | ICD-10-CM | POA: Diagnosis not present

## 2020-07-03 DIAGNOSIS — I739 Peripheral vascular disease, unspecified: Secondary | ICD-10-CM | POA: Diagnosis not present

## 2020-07-03 DIAGNOSIS — K219 Gastro-esophageal reflux disease without esophagitis: Secondary | ICD-10-CM | POA: Diagnosis not present

## 2020-07-03 DIAGNOSIS — M85852 Other specified disorders of bone density and structure, left thigh: Secondary | ICD-10-CM | POA: Diagnosis not present

## 2020-07-03 DIAGNOSIS — Z91013 Allergy to seafood: Secondary | ICD-10-CM | POA: Diagnosis not present

## 2020-07-03 DIAGNOSIS — Z419 Encounter for procedure for purposes other than remedying health state, unspecified: Secondary | ICD-10-CM

## 2020-07-03 DIAGNOSIS — Z91041 Radiographic dye allergy status: Secondary | ICD-10-CM | POA: Diagnosis not present

## 2020-07-03 DIAGNOSIS — Z807 Family history of other malignant neoplasms of lymphoid, hematopoietic and related tissues: Secondary | ICD-10-CM | POA: Diagnosis not present

## 2020-07-03 DIAGNOSIS — M1990 Unspecified osteoarthritis, unspecified site: Secondary | ICD-10-CM | POA: Insufficient documentation

## 2020-07-03 DIAGNOSIS — Z8262 Family history of osteoporosis: Secondary | ICD-10-CM | POA: Diagnosis not present

## 2020-07-03 DIAGNOSIS — Z825 Family history of asthma and other chronic lower respiratory diseases: Secondary | ICD-10-CM | POA: Diagnosis not present

## 2020-07-03 DIAGNOSIS — Z85828 Personal history of other malignant neoplasm of skin: Secondary | ICD-10-CM | POA: Diagnosis not present

## 2020-07-03 DIAGNOSIS — Z471 Aftercare following joint replacement surgery: Secondary | ICD-10-CM | POA: Diagnosis not present

## 2020-07-03 DIAGNOSIS — Z86718 Personal history of other venous thrombosis and embolism: Secondary | ICD-10-CM | POA: Diagnosis not present

## 2020-07-03 DIAGNOSIS — Z79899 Other long term (current) drug therapy: Secondary | ICD-10-CM | POA: Diagnosis not present

## 2020-07-03 DIAGNOSIS — E559 Vitamin D deficiency, unspecified: Secondary | ICD-10-CM | POA: Insufficient documentation

## 2020-07-03 DIAGNOSIS — Z96649 Presence of unspecified artificial hip joint: Secondary | ICD-10-CM

## 2020-07-03 DIAGNOSIS — E785 Hyperlipidemia, unspecified: Secondary | ICD-10-CM | POA: Diagnosis not present

## 2020-07-03 DIAGNOSIS — Z96642 Presence of left artificial hip joint: Secondary | ICD-10-CM | POA: Diagnosis not present

## 2020-07-03 DIAGNOSIS — H8110 Benign paroxysmal vertigo, unspecified ear: Secondary | ICD-10-CM | POA: Diagnosis not present

## 2020-07-03 HISTORY — PX: TOTAL HIP ARTHROPLASTY: SHX124

## 2020-07-03 SURGERY — ARTHROPLASTY, HIP, TOTAL, ANTERIOR APPROACH
Anesthesia: Spinal | Site: Hip | Laterality: Left

## 2020-07-03 MED ORDER — DEXAMETHASONE SODIUM PHOSPHATE 10 MG/ML IJ SOLN
8.0000 mg | Freq: Once | INTRAMUSCULAR | Status: AC
  Administered 2020-07-03: 8 mg via INTRAVENOUS

## 2020-07-03 MED ORDER — POVIDONE-IODINE 10 % EX SWAB
2.0000 "application " | Freq: Once | CUTANEOUS | Status: AC
  Administered 2020-07-03: 2 via TOPICAL

## 2020-07-03 MED ORDER — METHOCARBAMOL 500 MG PO TABS: 500.0000 mg | ORAL_TABLET | Freq: Four times a day (QID) | ORAL | Status: DC | PRN

## 2020-07-03 MED ORDER — SCOPOLAMINE 1 MG/3DAYS TD PT72
MEDICATED_PATCH | TRANSDERMAL | Status: AC
  Administered 2020-07-03: 1.5 mg via TRANSDERMAL
  Filled 2020-07-03: qty 1

## 2020-07-03 MED ORDER — RIVAROXABAN 10 MG PO TABS
10.0000 mg | ORAL_TABLET | Freq: Every day | ORAL | Status: DC
  Administered 2020-07-04: 10 mg via ORAL
  Filled 2020-07-03: qty 1

## 2020-07-03 MED ORDER — CEFAZOLIN SODIUM-DEXTROSE 2-4 GM/100ML-% IV SOLN
INTRAVENOUS | Status: AC
  Filled 2020-07-03: qty 100

## 2020-07-03 MED ORDER — CEFAZOLIN SODIUM-DEXTROSE 2-4 GM/100ML-% IV SOLN
2.0000 g | INTRAVENOUS | Status: AC
  Administered 2020-07-03: 2 g via INTRAVENOUS

## 2020-07-03 MED ORDER — ONDANSETRON HCL 4 MG/2ML IJ SOLN
INTRAMUSCULAR | Status: AC
  Filled 2020-07-03: qty 2

## 2020-07-03 MED ORDER — CEFAZOLIN SODIUM-DEXTROSE 2-4 GM/100ML-% IV SOLN
2.0000 g | Freq: Four times a day (QID) | INTRAVENOUS | Status: AC
  Administered 2020-07-03 (×2): 2 g via INTRAVENOUS
  Filled 2020-07-03 (×2): qty 100

## 2020-07-03 MED ORDER — OXYCODONE HCL 5 MG PO TABS: 5.0000 mg | ORAL_TABLET | Freq: Once | ORAL | Status: DC | PRN

## 2020-07-03 MED ORDER — PROPOFOL 10 MG/ML IV BOLUS
INTRAVENOUS | Status: DC | PRN
  Administered 2020-07-03 (×3): 20 mg via INTRAVENOUS

## 2020-07-03 MED ORDER — PHENYLEPHRINE 40 MCG/ML (10ML) SYRINGE FOR IV PUSH (FOR BLOOD PRESSURE SUPPORT)
PREFILLED_SYRINGE | INTRAVENOUS | Status: DC | PRN
  Administered 2020-07-03 (×2): 120 ug via INTRAVENOUS

## 2020-07-03 MED ORDER — ACETAMINOPHEN 10 MG/ML IV SOLN
1000.0000 mg | Freq: Four times a day (QID) | INTRAVENOUS | Status: DC
  Administered 2020-07-03: 1000 mg via INTRAVENOUS

## 2020-07-03 MED ORDER — PROPOFOL 1000 MG/100ML IV EMUL
INTRAVENOUS | Status: AC
  Filled 2020-07-03: qty 100

## 2020-07-03 MED ORDER — TRANEXAMIC ACID-NACL 1000-0.7 MG/100ML-% IV SOLN: 1000.0000 mg | INTRAVENOUS | Status: DC

## 2020-07-03 MED ORDER — ONDANSETRON HCL 4 MG/2ML IJ SOLN
INTRAMUSCULAR | Status: DC | PRN
  Administered 2020-07-03: 4 mg via INTRAVENOUS

## 2020-07-03 MED ORDER — HYDROCODONE-ACETAMINOPHEN 5-325 MG PO TABS: 1.0000 | ORAL_TABLET | ORAL | Status: DC | PRN

## 2020-07-03 MED ORDER — METHOCARBAMOL 500 MG IVPB - SIMPLE MED
INTRAVENOUS | Status: AC
  Filled 2020-07-03: qty 50

## 2020-07-03 MED ORDER — ONDANSETRON HCL 4 MG/2ML IJ SOLN: 4.0000 mg | Freq: Four times a day (QID) | INTRAMUSCULAR | Status: DC | PRN

## 2020-07-03 MED ORDER — METOCLOPRAMIDE HCL 5 MG PO TABS: 5.0000 mg | ORAL_TABLET | Freq: Three times a day (TID) | ORAL | Status: DC | PRN

## 2020-07-03 MED ORDER — ONDANSETRON HCL 4 MG/2ML IJ SOLN: 4.0000 mg | Freq: Once | INTRAMUSCULAR | Status: DC | PRN

## 2020-07-03 MED ORDER — SODIUM CHLORIDE 0.9 % IV SOLN: INTRAVENOUS | Status: DC

## 2020-07-03 MED ORDER — WATER FOR IRRIGATION, STERILE IR SOLN
Status: DC | PRN
  Administered 2020-07-03: 2000 mL

## 2020-07-03 MED ORDER — ORAL CARE MOUTH RINSE: 15.0000 mL | Freq: Once | OROMUCOSAL | Status: AC

## 2020-07-03 MED ORDER — AMISULPRIDE (ANTIEMETIC) 5 MG/2ML IV SOLN: 10.0000 mg | Freq: Once | INTRAVENOUS | Status: DC | PRN

## 2020-07-03 MED ORDER — TRAMADOL HCL 50 MG PO TABS
50.0000 mg | ORAL_TABLET | Freq: Four times a day (QID) | ORAL | Status: DC | PRN
  Administered 2020-07-03 (×3): 100 mg via ORAL
  Filled 2020-07-03 (×3): qty 2

## 2020-07-03 MED ORDER — ACETAMINOPHEN 325 MG PO TABS
325.0000 mg | ORAL_TABLET | Freq: Four times a day (QID) | ORAL | Status: DC | PRN
  Administered 2020-07-03 – 2020-07-04 (×3): 650 mg via ORAL
  Filled 2020-07-03 (×3): qty 2

## 2020-07-03 MED ORDER — ACETAMINOPHEN 10 MG/ML IV SOLN
INTRAVENOUS | Status: AC
  Filled 2020-07-03: qty 100

## 2020-07-03 MED ORDER — PANTOPRAZOLE SODIUM 40 MG PO TBEC
40.0000 mg | DELAYED_RELEASE_TABLET | Freq: Every day | ORAL | Status: DC
  Filled 2020-07-03: qty 1

## 2020-07-03 MED ORDER — BUPIVACAINE-EPINEPHRINE (PF) 0.25% -1:200000 IJ SOLN
INTRAMUSCULAR | Status: AC
  Filled 2020-07-03: qty 30

## 2020-07-03 MED ORDER — MENTHOL 3 MG MT LOZG
1.0000 | LOZENGE | OROMUCOSAL | Status: DC | PRN
  Administered 2020-07-03: 3 mg via ORAL
  Filled 2020-07-03: qty 9

## 2020-07-03 MED ORDER — MAGNESIUM CITRATE PO SOLN: 1.0000 | Freq: Once | ORAL | Status: DC | PRN

## 2020-07-03 MED ORDER — TRANEXAMIC ACID 1000 MG/10ML IV SOLN
INTRAVENOUS | Status: DC | PRN
  Administered 2020-07-03: 2000 mg via TOPICAL

## 2020-07-03 MED ORDER — LACTATED RINGERS IV SOLN: INTRAVENOUS | Status: DC

## 2020-07-03 MED ORDER — PROPOFOL 10 MG/ML IV BOLUS
INTRAVENOUS | Status: AC
  Filled 2020-07-03: qty 20

## 2020-07-03 MED ORDER — PROPOFOL 500 MG/50ML IV EMUL
INTRAVENOUS | Status: DC | PRN
  Administered 2020-07-03: 125 ug/kg/min via INTRAVENOUS

## 2020-07-03 MED ORDER — CHLORHEXIDINE GLUCONATE 0.12 % MT SOLN
15.0000 mL | Freq: Once | OROMUCOSAL | Status: AC
  Administered 2020-07-03: 15 mL via OROMUCOSAL

## 2020-07-03 MED ORDER — TRANEXAMIC ACID 1000 MG/10ML IV SOLN
2000.0000 mg | Freq: Once | INTRAVENOUS | Status: DC
  Filled 2020-07-03: qty 20

## 2020-07-03 MED ORDER — DEXAMETHASONE SODIUM PHOSPHATE 10 MG/ML IJ SOLN
10.0000 mg | Freq: Once | INTRAMUSCULAR | Status: AC
  Administered 2020-07-04: 10 mg via INTRAVENOUS
  Filled 2020-07-03: qty 1

## 2020-07-03 MED ORDER — BUPIVACAINE-EPINEPHRINE (PF) 0.25% -1:200000 IJ SOLN
INTRAMUSCULAR | Status: DC | PRN
  Administered 2020-07-03: 30 mL

## 2020-07-03 MED ORDER — SCOPOLAMINE 1 MG/3DAYS TD PT72: 1.0000 | MEDICATED_PATCH | TRANSDERMAL | Status: DC

## 2020-07-03 MED ORDER — BISACODYL 10 MG RE SUPP: 10.0000 mg | Freq: Every day | RECTAL | Status: DC | PRN

## 2020-07-03 MED ORDER — METHOCARBAMOL 500 MG IVPB - SIMPLE MED
500.0000 mg | Freq: Four times a day (QID) | INTRAVENOUS | Status: DC | PRN
  Filled 2020-07-03: qty 50

## 2020-07-03 MED ORDER — ONDANSETRON HCL 4 MG PO TABS: 4.0000 mg | ORAL_TABLET | Freq: Four times a day (QID) | ORAL | Status: DC | PRN

## 2020-07-03 MED ORDER — DOCUSATE SODIUM 100 MG PO CAPS
100.0000 mg | ORAL_CAPSULE | Freq: Two times a day (BID) | ORAL | Status: DC
  Administered 2020-07-03 – 2020-07-04 (×2): 100 mg via ORAL
  Filled 2020-07-03 (×2): qty 1

## 2020-07-03 MED ORDER — PHENOL 1.4 % MT LIQD: 1.0000 | OROMUCOSAL | Status: DC | PRN

## 2020-07-03 MED ORDER — OXYCODONE HCL 5 MG/5ML PO SOLN: 5.0000 mg | Freq: Once | ORAL | Status: DC | PRN

## 2020-07-03 MED ORDER — KETOROLAC TROMETHAMINE 15 MG/ML IJ SOLN
INTRAMUSCULAR | Status: DC | PRN
Start: 2020-07-03 — End: 2020-07-03
  Administered 2020-07-03: 15 mg via INTRAVENOUS

## 2020-07-03 MED ORDER — FENTANYL CITRATE (PF) 100 MCG/2ML IJ SOLN: 25.0000 ug | INTRAMUSCULAR | Status: DC | PRN

## 2020-07-03 MED ORDER — SODIUM CHLORIDE 0.9 % IV BOLUS
500.0000 mL | Freq: Once | INTRAVENOUS | Status: AC
  Administered 2020-07-03: 500 mL via INTRAVENOUS

## 2020-07-03 MED ORDER — 0.9 % SODIUM CHLORIDE (POUR BTL) OPTIME
TOPICAL | Status: DC | PRN
  Administered 2020-07-03: 1000 mL

## 2020-07-03 MED ORDER — MORPHINE SULFATE (PF) 2 MG/ML IV SOLN: 0.5000 mg | INTRAVENOUS | Status: DC | PRN

## 2020-07-03 MED ORDER — BUPIVACAINE IN DEXTROSE 0.75-8.25 % IT SOLN
INTRATHECAL | Status: DC | PRN
Start: 2020-07-03 — End: 2020-07-03
  Administered 2020-07-03: 1.8 mL via INTRATHECAL

## 2020-07-03 MED ORDER — TRANEXAMIC ACID-NACL 1000-0.7 MG/100ML-% IV SOLN
INTRAVENOUS | Status: AC
  Filled 2020-07-03: qty 100

## 2020-07-03 MED ORDER — PHENYLEPHRINE 40 MCG/ML (10ML) SYRINGE FOR IV PUSH (FOR BLOOD PRESSURE SUPPORT)
PREFILLED_SYRINGE | INTRAVENOUS | Status: AC
  Filled 2020-07-03: qty 10

## 2020-07-03 MED ORDER — DEXAMETHASONE SODIUM PHOSPHATE 10 MG/ML IJ SOLN
INTRAMUSCULAR | Status: AC
  Filled 2020-07-03: qty 1

## 2020-07-03 MED ORDER — MIDAZOLAM HCL 2 MG/2ML IJ SOLN
INTRAMUSCULAR | Status: DC | PRN
  Administered 2020-07-03: 2 mg via INTRAVENOUS

## 2020-07-03 MED ORDER — POLYETHYLENE GLYCOL 3350 17 G PO PACK: 17.0000 g | PACK | Freq: Every day | ORAL | Status: DC | PRN

## 2020-07-03 MED ORDER — MIDAZOLAM HCL 2 MG/2ML IJ SOLN
INTRAMUSCULAR | Status: AC
  Filled 2020-07-03: qty 2

## 2020-07-03 MED ORDER — METOCLOPRAMIDE HCL 5 MG/ML IJ SOLN: 5.0000 mg | Freq: Three times a day (TID) | INTRAMUSCULAR | Status: DC | PRN

## 2020-07-03 SURGICAL SUPPLY — 49 items
BAG DECANTER FOR FLEXI CONT (MISCELLANEOUS) ×1 IMPLANT
BAG SPEC THK2 15X12 ZIP CLS (MISCELLANEOUS)
BAG ZIPLOCK 12X15 (MISCELLANEOUS) IMPLANT
BLADE SAG 18X100X1.27 (BLADE) ×2 IMPLANT
CLSR STERI-STRIP ANTIMIC 1/2X4 (GAUZE/BANDAGES/DRESSINGS) ×1 IMPLANT
COVER PERINEAL POST (MISCELLANEOUS) ×2 IMPLANT
COVER SURGICAL LIGHT HANDLE (MISCELLANEOUS) ×2 IMPLANT
COVER WAND RF STERILE (DRAPES) ×1 IMPLANT
CUP ACET PINNACLE SECTR 50MM (Hips) IMPLANT
DECANTER SPIKE VIAL GLASS SM (MISCELLANEOUS) ×2 IMPLANT
DRAPE STERI IOBAN 125X83 (DRAPES) ×2 IMPLANT
DRAPE U-SHAPE 47X51 STRL (DRAPES) ×4 IMPLANT
DRESSING AQUACEL AG SP 3.5X10 (GAUZE/BANDAGES/DRESSINGS) IMPLANT
DRSG ADAPTIC 3X8 NADH LF (GAUZE/BANDAGES/DRESSINGS) ×2 IMPLANT
DRSG AQUACEL AG ADV 3.5X10 (GAUZE/BANDAGES/DRESSINGS) ×2 IMPLANT
DRSG AQUACEL AG SP 3.5X10 (GAUZE/BANDAGES/DRESSINGS) ×2
DURAPREP 26ML APPLICATOR (WOUND CARE) ×2 IMPLANT
ELECT REM PT RETURN 15FT ADLT (MISCELLANEOUS) ×2 IMPLANT
EVACUATOR 1/8 PVC DRAIN (DRAIN) IMPLANT
GLOVE BIO SURGEON STRL SZ 6 (GLOVE) ×1 IMPLANT
GLOVE BIO SURGEON STRL SZ7 (GLOVE) IMPLANT
GLOVE BIO SURGEON STRL SZ8 (GLOVE) ×2 IMPLANT
GLOVE BIOGEL PI IND STRL 6.5 (GLOVE) IMPLANT
GLOVE BIOGEL PI IND STRL 7.0 (GLOVE) IMPLANT
GLOVE BIOGEL PI IND STRL 8 (GLOVE) ×1 IMPLANT
GLOVE BIOGEL PI INDICATOR 6.5 (GLOVE) ×1
GLOVE BIOGEL PI INDICATOR 7.0 (GLOVE)
GLOVE BIOGEL PI INDICATOR 8 (GLOVE) ×1
GOWN STRL REUS W/TWL LRG LVL3 (GOWN DISPOSABLE) ×2 IMPLANT
GOWN STRL REUS W/TWL XL LVL3 (GOWN DISPOSABLE) IMPLANT
HEAD FEMORAL 32 CERAMIC (Hips) ×1 IMPLANT
HOLDER FOLEY CATH W/STRAP (MISCELLANEOUS) ×2 IMPLANT
KIT TURNOVER KIT A (KITS) IMPLANT
LINER MARATHON 32 50 (Hips) ×1 IMPLANT
MANIFOLD NEPTUNE II (INSTRUMENTS) ×2 IMPLANT
PACK ANTERIOR HIP CUSTOM (KITS) ×2 IMPLANT
PENCIL SMOKE EVACUATOR COATED (MISCELLANEOUS) ×2 IMPLANT
PINNACLE SECTOR CUP 50MM (Hips) ×2 IMPLANT
STEM FEMORAL SZ 6MM STD ACTIS (Stem) ×1 IMPLANT
STRIP CLOSURE SKIN 1/2X4 (GAUZE/BANDAGES/DRESSINGS) ×2 IMPLANT
SUT ETHIBOND NAB CT1 #1 30IN (SUTURE) ×2 IMPLANT
SUT MNCRL AB 4-0 PS2 18 (SUTURE) ×2 IMPLANT
SUT STRATAFIX 0 PDS 27 VIOLET (SUTURE) ×2
SUT VIC AB 2-0 CT1 27 (SUTURE) ×4
SUT VIC AB 2-0 CT1 TAPERPNT 27 (SUTURE) ×2 IMPLANT
SUTURE STRATFX 0 PDS 27 VIOLET (SUTURE) ×1 IMPLANT
SYR 50ML LL SCALE MARK (SYRINGE) IMPLANT
TRAY FOLEY MTR SLVR 14FR STAT (SET/KITS/TRAYS/PACK) ×1 IMPLANT
TRAY FOLEY MTR SLVR 16FR STAT (SET/KITS/TRAYS/PACK) ×1 IMPLANT

## 2020-07-03 NOTE — Anesthesia Postprocedure Evaluation (Signed)
Anesthesia Post Note  Patient: [name]  Procedure(s) Performed: TOTAL HIP ARTHROPLASTY ANTERIOR APPROACH (Left Hip)     Patient location during evaluation: PACU Anesthesia Type: Spinal Level of consciousness: oriented and awake and alert Pain management: pain level controlled Vital Signs Assessment: post-procedure vital signs reviewed and stable Respiratory status: spontaneous breathing, respiratory function stable and nonlabored ventilation Cardiovascular status: blood pressure returned to baseline and stable Postop Assessment: no headache, no backache, no apparent nausea or vomiting and spinal receding Anesthetic complications: no   No complications documented.  Last Vitals:  Vitals:   07/03/20 1321 07/03/20 1849  BP: 122/83 105/66  Pulse: 77 (!) 57  Resp: 18 17  Temp: 36.8 C 36.5 C  SpO2: 100% 100%    Last Pain:  Vitals:   07/03/20 1849  TempSrc: Oral  PainSc:                  Lidia Collum

## 2020-07-03 NOTE — Discharge Instructions (Addendum)
Gaynelle Arabian, MD Total Joint Specialist [facility] [place] [address], [address] [place],  [address] [phone]  ANTERIOR APPROACH TOTAL HIP REPLACEMENT POSTOPERATIVE DIRECTIONS     Hip Rehabilitation, Guidelines Following Surgery  The results of a hip operation are greatly improved after range of motion and muscle strengthening exercises. Follow all safety measures which are given to protect your hip. If any of these exercises cause increased pain or swelling in your joint, decrease the amount until you are comfortable again. Then slowly increase the exercises. Call your caregiver if you have problems or questions.   BLOOD CLOT PREVENTION . Take a 10 mg Xarelto once a day for three weeks following surgery. Then resume one 81 mg aspirin once a day. . You may resume your vitamins/supplements once you have discontinued the Xarelto. . Do not take any NSAIDs (Advil, Aleve, Ibuprofen, Meloxicam, etc.) until you have discontinued the Xarelto.   HOME CARE INSTRUCTIONS  . Remove items at home which could result in a fall. This includes throw rugs or furniture in walking pathways.   ICE to the affected hip as frequently as 20-30 minutes an hour and then as needed for pain and swelling. Continue to use ice on the hip for pain and swelling from surgery. You may notice swelling that will progress down to the foot and ankle. This is normal after surgery. Elevate the leg when you are not up walking on it.    Continue to use the breathing machine which will help keep your temperature down.  It is common for your temperature to cycle up and down following surgery, especially at night when you are not up moving around and exerting yourself.  The breathing machine keeps your lungs expanded and your temperature down.  DIET You may resume your previous home diet once your are discharged from the hospital.  DRESSING / WOUND CARE / SHOWERING . You have an adhesive waterproof bandage  over the incision. Leave this in place until your first follow-up appointment. Once you remove this you will not need to place another bandage.  . You may begin showering 3 days following surgery, but do not submerge the incision under water.  ACTIVITY . For the first 3-5 days, it is important to rest and keep the operative leg elevated. You should, as a general rule, rest for 50 minutes and walk/stretch for 10 minutes per hour. After 5 days, you may slowly increase activity as tolerated.  Marland Kitchen Perform the exercises you were provided twice a day for about 15-20 minutes each session. Begin these 2 days following surgery. . Walk with your walker as instructed. Use the walker until you are comfortable transitioning to a cane. Walk with the cane in the opposite hand of the operative leg. You may discontinue the cane once you are comfortable and walking steadily. . Avoid periods of inactivity such as sitting longer than an hour when not asleep. This helps prevent blood clots.  . Do not drive a car for 6 weeks or until released by your surgeon.  . Do not drive while taking narcotics.  TED HOSE STOCKINGS Wear the elastic stockings on both legs for three weeks following surgery during the day. You may remove them at night while sleeping.  WEIGHT BEARING Weight bearing as tolerated with assist device (walker, cane, etc) as directed, use it as long as suggested by your surgeon or therapist, typically at least 4-6 weeks.  POSTOPERATIVE CONSTIPATION PROTOCOL Constipation - defined medically as fewer than three  stools per week and severe constipation as less than one stool per week.  One of the most common issues patients have following surgery is constipation.  Even if you have a regular bowel pattern at home, your normal regimen is likely to be disrupted due to multiple reasons following surgery.  Combination of anesthesia, postoperative narcotics, change in appetite and fluid intake all can affect your  bowels.  In order to avoid complications following surgery, here are some recommendations in order to help you during your recovery period.  . Colace (docusate) - Pick up an over-the-counter form of Colace or another stool softener and take twice a day as long as you are requiring postoperative pain medications.  Take with a full glass of water daily.  If you experience loose stools or diarrhea, hold the colace until you stool forms back up.  If your symptoms do not get better within 1 week or if they get worse, check with your doctor. . Dulcolax (bisacodyl) - Pick up over-the-counter and take as directed by the product packaging as needed to assist with the movement of your bowels.  Take with a full glass of water.  Use this product as needed if not relieved by Colace only.  . MiraLax (polyethylene glycol) - Pick up over-the-counter to have on hand.  MiraLax is a solution that will increase the amount of water in your bowels to assist with bowel movements.  Take as directed and can mix with a glass of water, juice, soda, coffee, or tea.  Take if you go more than two days without a movement.Do not use MiraLax more than once per day. Call your doctor if you are still constipated or irregular after using this medication for 7 days in a row.  If you continue to have problems with postoperative constipation, please contact the office for further assistance and recommendations.  If you experience "the worst abdominal pain ever" or develop nausea or vomiting, please contact the office immediatly for further recommendations for treatment.  ITCHING  If you experience itching with your medications, try taking only a single pain pill, or even half a pain pill at a time.  You can also use Benadryl over the counter for itching or also to help with sleep.   MEDICATIONS See your medication summary on the "After Visit Summary" that the nursing staff will review with you prior to discharge.  You may have some home  medications which will be placed on hold until you complete the course of blood thinner medication.  It is important for you to complete the blood thinner medication as prescribed by your surgeon.  Continue your approved medications as instructed at time of discharge.  _________________________________________________  Information on my medicine - XARELTO (Rivaroxaban)  This medication education was reviewed with me or my healthcare representative as part of my discharge preparation.    Why was Xarelto prescribed for you? Xarelto was prescribed for you to reduce the risk of blood clots forming after orthopedic surgery. The medical term for these abnormal blood clots is venous thromboembolism (VTE).  What do you need to know about xarelto ? Take your Xarelto ONCE DAILY at the same time every day. You may take it either with or without food.  If you have difficulty swallowing the tablet whole, you may crush it and mix in applesauce just prior to taking your dose.  Take Xarelto exactly as prescribed by your doctor and DO NOT stop taking Xarelto without talking to the doctor who  prescribed the medication.  Stopping without other VTE prevention medication to take the place of Xarelto may increase your risk of developing a clot.  After discharge, you should have regular check-up appointments with your healthcare provider that is prescribing your Xarelto.    What do you do if you miss a dose? If you miss a dose, take it as soon as you remember on the same day then continue your regularly scheduled once daily regimen the next day. Do not take two doses of Xarelto on the same day.   Important Safety Information A possible side effect of Xarelto is bleeding. You should call your healthcare provider right away if you experience any of the following: ? Bleeding from an injury or your nose that does not stop. ? Unusual colored urine (red or dark brown) or unusual colored stools (red or  black). ? Unusual bruising for unknown reasons. ? A serious fall or if you hit your head (even if there is no bleeding).  Some medicines may interact with Xarelto and might increase your risk of bleeding while on Xarelto. To help avoid this, consult your healthcare provider or pharmacist prior to using any new prescription or non-prescription medications, including herbals, vitamins, non-steroidal anti-inflammatory drugs (NSAIDs) and supplements.  This website has more information on Xarelto: [email or web address]  ____________________________________________________________________  PRECAUTIONS If you experience chest pain or shortness of breath - call 911 immediately for transfer to the hospital emergency department.  If you develop a fever greater that 101 F, purulent drainage from wound, increased redness or drainage from wound, foul odor from the wound/dressing, or calf pain - CONTACT YOUR SURGEON.                                                   FOLLOW-UP APPOINTMENTS Make sure you keep all of your appointments after your operation with your surgeon and caregivers. You should call the office at the above phone number and make an appointment for approximately two weeks after the date of your surgery or on the date instructed by your surgeon outlined in the "After Visit Summary".  RANGE OF MOTION AND STRENGTHENING EXERCISES  These exercises are designed to help you keep full movement of your hip joint. Follow your caregiver's or physical therapist's instructions. Perform all exercises about fifteen times, three times per day or as directed. Exercise both hips, even if you have had only one joint replacement. These exercises can be done on a training (exercise) mat, on the floor, on a table or on a bed. Use whatever works the best and is most comfortable for you. Use music or television while you are exercising so that the exercises are a pleasant break in your day. This will make your life  better with the exercises acting as a break in routine you can look forward to.  . Lying on your back, slowly slide your foot toward your buttocks, raising your knee up off the floor. Then slowly slide your foot back down until your leg is straight again.  . Lying on your back spread your legs as far apart as you can without causing discomfort.  . Lying on your side, raise your upper leg and foot straight up from the floor as far as is comfortable. Slowly lower the leg and repeat.  . Lying on your back, tighten up the muscle  in the front of your thigh (quadriceps muscles). You can do this by keeping your leg straight and trying to raise your heel off the floor. This helps strengthen the largest muscle supporting your knee.  . Lying on your back, tighten up the muscles of your buttocks both with the legs straight and with the knee bent at a comfortable angle while keeping your heel on the floor.   IF YOU ARE TRANSFERRED TO A SKILLED REHAB FACILITY If the patient is transferred to a skilled rehab facility following release from the hospital, a list of the current medications will be sent to the facility for the patient to continue.  When discharged from the skilled rehab facility, please have the facility set up the patient's [place] prior to being released. Also, the skilled facility will be responsible for providing the patient with their medications at time of release from the facility to include their pain medication, the muscle relaxants, and their blood thinner medication. If the patient is still at the rehab facility at time of the two week follow up appointment, the skilled rehab facility will also need to assist the patient in arranging follow up appointment in our office and any transportation needs.  MAKE SURE YOU:  . Understand these instructions.  . Get help right away if you are not doing well or get worse.    DENTAL ANTIBIOTICS:  In most cases prophylactic  antibiotics for Dental procdeures after total joint surgery are not necessary.  Exceptions are as follows:  1. History of prior total joint infection  2. Severely immunocompromised (Organ Transplant, cancer chemotherapy, Rheumatoid biologic meds such as [place])  3. Poorly controlled diabetes (A1C &gt; 8.0, blood glucose over 200)  If you have one of these conditions, contact your surgeon for an antibiotic prescription, prior to your dental procedure.    Pick up stool softner and laxative for home use following surgery while on pain medications. Do not submerge incision under water. Please use good hand washing techniques while changing dressing each day. May shower starting three days after surgery. Please use a clean towel to pat the incision dry following showers. Continue to use ice for pain and swelling after surgery. Do not use any lotions or creams on the incision until instructed by your surgeon.

## 2020-07-03 NOTE — Plan of Care (Signed)
Discussed with patient and husband about plan of care for post-op day 0. ° ° °Will continue to monitor patient.  ° ° °SWhittemore, RN ° °

## 2020-07-03 NOTE — Plan of Care (Signed)
Plan of care discussed with the patient and her husband.

## 2020-07-03 NOTE — Anesthesia Procedure Notes (Signed)
Spinal  Patient location during procedure: OR Start time: 07/03/2020 8:26 AM End time: 07/03/2020 8:31 AM Staffing Performed: resident/CRNA  Resident/CRNA: Niel Hummer, CRNA Preanesthetic Checklist Completed: patient identified, IV checked, risks and benefits discussed, surgical consent, monitors and equipment checked and pre-op evaluation Spinal Block Patient position: sitting Prep: DuraPrep Patient monitoring: continuous pulse ox, blood pressure and heart rate Approach: midline Location: L3-4 Injection technique: single-shot Needle Needle type: Pencan  Needle gauge: 24 G Needle length: 10 cm

## 2020-07-03 NOTE — Transfer of Care (Signed)
Immediate Anesthesia Transfer of Care Note  Patient: Elizabeth Douglas  Procedure(s) Performed: TOTAL HIP ARTHROPLASTY ANTERIOR APPROACH (Left Hip)  Patient Location: PACU  Anesthesia Type:Spinal  Level of Consciousness: awake, alert  and oriented  Airway & Oxygen Therapy: Patient Spontanous Breathing and Patient connected to face mask oxygen  Post-op Assessment: Report given to RN and Post -op Vital signs reviewed and stable  Post vital signs: Reviewed and stable  Last Vitals:  Vitals Value Taken Time  BP 93/62 07/03/20 0953  Temp    Pulse 78 07/03/20 0954  Resp 11 07/03/20 0954  SpO2 100 % 07/03/20 0954  Vitals shown include unvalidated device data.  Last Pain:  Vitals:   07/03/20 0641  TempSrc: Oral         Complications: No complications documented.

## 2020-07-03 NOTE — Evaluation (Signed)
Physical Therapy Evaluation Patient Details Name: Elizabeth Douglas MRN: [id number] DOB: 11/21/1951 Today's Date: 07/03/2020   History of Present Illness  Patient is 69 y.o. female s/p Lt THA anterior approach with PMH significant for PVD, osteopenia, DVT, OA, GERD, Rt THA in 2019.    Clinical Impression  Elizabeth Douglas is a 69 y.o. female POD 0 s/p Lt THA. Patient reports independence with mobility at baseline. Patient is now limited by functional impairments (see PT problem list below) and requires min assist/guard for transfers and gait with RW. Patient was able to ambulate ~15 feet with RW and min assist and was limited by lightheadedness and found to be orthostatic. BP 89/56 while reclined in chair and improved to Patient reclined in chair and instructed in exercise to facilitate circulation. Symptoms past with seated rest and BP increased to 95/67 sitting upright. Patient will benefit from continued skilled PT interventions to address impairments and progress towards PLOF. Acute PT will follow to progress mobility and stair training in preparation for safe discharge home.       Follow Up Recommendations Follow surgeon's recommendation for DC plan and follow-up therapies    Equipment Recommendations  None recommended by PT    Recommendations for Other Services       Precautions / Restrictions Precautions Precautions: Fall Restrictions Weight Bearing Restrictions: No Other Position/Activity Restrictions: WBAT      Mobility  Bed Mobility Overal bed mobility: Needs Assistance Bed Mobility: Supine to Sit     Supine to sit: Min assist;HOB elevated;Min guard     General bed mobility comments: light min assist for Lt LE mobility, pt able to raise trunk with use of bed rail and to scoot to EOB.  Transfers Overall transfer level: Needs assistance Equipment used: Rolling walker (2 wheeled) Transfers: Sit to/from Stand Sit to Stand: Min guard         General transfer comment:  VCs for safe technique with RW, min guard for safety, pt steady with rising.   Ambulation/Gait Ambulation/Gait assistance: Min guard Gait Distance (Feet): 15 Feet Assistive device: Rolling walker (2 wheeled) Gait Pattern/deviations: Step-to pattern;Decreased stride length Gait velocity: decr   General Gait Details: VCs for safe step pattern and proximity to RW. pt limited in distance by c/o lightheadedness and overheated sensation. pt provided seated rest in recliner.  Stairs            Wheelchair Mobility    Modified Rankin (Stroke Patients Only)       Balance Overall balance assessment: Needs assistance Sitting-balance support: Feet supported Sitting balance-Leahy Scale: Good     Standing balance support: During functional activity;Bilateral upper extremity supported Standing balance-Leahy Scale: Fair            Pertinent Vitals/Pain Pain Assessment: 0-10 Pain Score: 1  Pain Location: Lt hip Pain Descriptors / Indicators: Burning Pain Intervention(s): Limited activity within patient's tolerance;Monitored during session;Repositioned    Home Living Family/patient expects to be discharged to:: Private residence Living Arrangements: Spouse/significant other Available Help at Discharge: Family Type of Home: House Home Access: Stairs to enter Entrance Stairs-Rails: Chemical engineer of Steps: 4 Home Layout: Two level Home Equipment: Environmental consultant - 2 wheels;Cane - single point;Bedside commode;Shower seat - built in      Prior Function Level of Independence: Independent         Comments: pt enjoys walking ~2.5 miles per day and going hiking with family.     Hand Dominance   Dominant Hand: Right  Extremity/Trunk Assessment   Upper Extremity Assessment Upper Extremity Assessment: Overall WFL for tasks assessed    Lower Extremity Assessment Lower Extremity Assessment: Overall WFL for tasks assessed    Cervical / Trunk  Assessment Cervical / Trunk Assessment: Normal  Communication   Communication: No difficulties  Cognition Arousal/Alertness: Awake/alert Behavior During Therapy: WFL for tasks assessed/performed Overall Cognitive Status: Within Functional Limits for tasks assessed       General Comments      Exercises Total Joint Exercises Ankle Circles/Pumps: AROM;20 reps;Seated;Both   Assessment/Plan    PT Assessment Patient needs continued PT services  PT Problem List Decreased strength;Decreased range of motion;Decreased activity tolerance;Decreased balance;Decreased mobility;Decreased knowledge of use of DME;Decreased knowledge of precautions       PT Treatment Interventions DME instruction;Gait training;Stair training;Functional mobility training;Therapeutic activities;Therapeutic exercise;Balance training;Patient/family education    PT Goals (Current goals can be found in the Care Plan section)  Acute Rehab PT Goals Patient Stated Goal: get back to hiking for exercise PT Goal Formulation: With patient Time For Goal Achievement: 07/10/20 Potential to Achieve Goals: Good    Frequency 7X/week    AM-PAC PT "6 Clicks" Mobility  Outcome Measure Help needed turning from your back to your side while in a flat bed without using bedrails?: A Little Help needed moving from lying on your back to sitting on the side of a flat bed without using bedrails?: A Little Help needed moving to and from a bed to a chair (including a wheelchair)?: A Little Help needed standing up from a chair using your arms (e.g., wheelchair or bedside chair)?: A Little Help needed to walk in hospital room?: A Little Help needed climbing 3-5 steps with a railing? : A Little 6 Click Score: 18    End of Session Equipment Utilized During Treatment: Gait belt Activity Tolerance: Patient tolerated treatment well Patient left: in chair;with call bell/phone within reach;with chair alarm set;with family/visitor present Nurse  Communication: Mobility status;Patient requests pain meds (orthostatic and request for tylenol) PT Visit Diagnosis: Muscle weakness (generalized) (M62.81);Difficulty in walking, not elsewhere classified (R26.2)    Time: 5929-2446 PT Time Calculation (min) (ACUTE ONLY): 22 min   Charges:   PT Evaluation $PT Eval Low Complexity: 1 Low          Verner Mould, DPT Acute Rehabilitation Services  Office [phone] Pager [phone]  07/03/2020 4:08 PM

## 2020-07-03 NOTE — Op Note (Signed)
OPERATIVE REPORT- TOTAL HIP ARTHROPLASTY   PREOPERATIVE DIAGNOSIS: Osteoarthritis of the Left hip.   POSTOPERATIVE DIAGNOSIS: Osteoarthritis of the Left  hip.   PROCEDURE: Left total hip arthroplasty, anterior approach.   SURGEON: Gaynelle Arabian, MD   ASSISTANT: Griffith Citron, PA-C  ANESTHESIA:  Spinal  ESTIMATED BLOOD LOSS:-100 mL    DRAINS: Hemovac x1.   COMPLICATIONS: None   CONDITION: PACU - hemodynamically stable.   BRIEF CLINICAL NOTE: Elizabeth Douglas is a 69 y.o. female who has advanced end-  stage arthritis of their Left  hip with progressively worsening pain and  dysfunction.The patient has failed nonoperative management and presents for  total hip arthroplasty.   PROCEDURE IN DETAIL: After successful administration of spinal  anesthetic, the traction boots for the [facility] bed were placed on both  feet and the patient was placed onto the [facility] bed, boots placed into the leg  holders. The Left hip was then isolated from the perineum with plastic  drapes and prepped and draped in the usual sterile fashion. ASIS and  greater trochanter were marked and a oblique incision was made, starting  at about 1 cm lateral and 2 cm distal to the ASIS and coursing towards  the anterior cortex of the femur. The skin was cut with a 10 blade  through subcutaneous tissue to the level of the fascia overlying the  tensor fascia lata muscle. The fascia was then incised in line with the  incision at the junction of the anterior third and posterior 2/3rd. The  muscle was teased off the fascia and then the interval between the TFL  and the rectus was developed. The Hohmann retractor was then placed at  the top of the femoral neck over the capsule. The vessels overlying the  capsule were cauterized and the fat on top of the capsule was removed.  A Hohmann retractor was then placed anterior underneath the rectus  femoris to give exposure to the entire anterior capsule. A T-shaped   capsulotomy was performed. The edges were tagged and the femoral head  was identified.       Osteophytes are removed off the superior acetabulum.  The femoral neck was then cut in situ with an oscillating saw. Traction  was then applied to the left lower extremity utilizing the [facility]  traction. The femoral head was then removed. Retractors were placed  around the acetabulum and then circumferential removal of the labrum was  performed. Osteophytes were also removed. Reaming starts at 47 mm to  medialize and  Increased in 2 mm increments to 49 mm. We reamed in  approximately 40 degrees of abduction, 20 degrees anteversion. A 50 mm  pinnacle acetabular shell was then impacted in anatomic position under  fluoroscopic guidance with excellent purchase. We did not need to place  any additional dome screws. A 32 mm neutral + 4 marathon liner was then  placed into the acetabular shell.       The femoral lift was then placed along the lateral aspect of the femur  just distal to the vastus ridge. The leg was  externally rotated and capsule  was stripped off the inferior aspect of the femoral neck down to the  level of the lesser trochanter, this was done with electrocautery. The femur was lifted after this was performed. The  leg was then placed in an extended and adducted position essentially delivering the femur. We also removed the capsule superiorly and the piriformis from the piriformis  fossa to gain excellent exposure of the  proximal femur. Rongeur was used to remove some cancellous bone to get  into the lateral portion of the proximal femur for placement of the  initial starter reamer. The starter broaches was placed  the starter broach  and was shown to go down the center of the canal. Broaching  with the Actis system was then performed starting at size 0  coursing  Up to size 6. A size 6 had excellent torsional and rotational  and axial stability. The trial standard offset neck was then  placed  with a 32 + 1 trial head. The hip was then reduced. We confirmed that  the stem was in the canal both on AP and lateral x-rays. It also has excellent sizing. The hip was reduced with outstanding stability through full extension and full external rotation.. AP pelvis was taken and the leg lengths were measured and found to be equal. Hip was then dislocated again and the femoral head and neck removed. The  femoral broach was removed. Size 6 Actis stem with a standard offset  neck was then impacted into the femur following native anteversion. Has  excellent purchase in the canal. Excellent torsional and rotational and  axial stability. It is confirmed to be in the canal on AP and lateral  fluoroscopic views. The 32 + 1 ceramic head was placed and the hip  reduced with outstanding stability. Again AP pelvis was taken and it  confirmed that the leg lengths were equal. The wound was then copiously  irrigated with saline solution and the capsule reattached and repaired  with Ethibond suture. 30 ml of .25% Bupivicaine was  injected into the capsule and into the edge of the tensor fascia lata as well as subcutaneous tissue. The fascia overlying the tensor fascia lata was then closed with a running #1 V-Loc. Subcu was closed with interrupted 2-0 Vicryl and subcuticular running 4-0 Monocryl. Incision was cleaned  and dried. Steri-Strips and a bulky sterile dressing applied. Hemovac  drain was hooked to suction and then the patient was awakened and transported to  recovery in stable condition.        Please note that a surgical assistant was a medical necessity for this procedure to perform it in a safe and expeditious manner. Assistant was necessary to provide appropriate retraction of vital neurovascular structures and to prevent femoral fracture and allow for anatomic placement of the prosthesis.  Gaynelle Arabian, M.D.

## 2020-07-03 NOTE — Interval H&P Note (Signed)
History and Physical Interval Note:  07/03/2020 7:35 AM  Elizabeth Douglas  has presented today for surgery, with the diagnosis of left hip osteoarthritis.  The various methods of treatment have been discussed with the patient and family. After consideration of risks, benefits and other options for treatment, the patient has consented to  Procedure(s) with comments: [place] (Left) - 141min as a surgical intervention.  The patient's history has been reviewed, patient examined, no change in status, stable for surgery.  I have reviewed the patient's chart and labs.  Questions were answered to the patient's satisfaction.     Pilar Plate Aayan Haskew

## 2020-07-03 NOTE — Anesthesia Procedure Notes (Signed)
Procedure Name: MAC Date/Time: 07/03/2020 8:25 AM Performed by: Niel Hummer, CRNA Pre-anesthesia Checklist: Patient identified, Emergency Drugs available, Suction available and Patient being monitored Oxygen Delivery Method: Simple face mask

## 2020-07-04 ENCOUNTER — Encounter (HOSPITAL_COMMUNITY): Payer: Self-pay | Admitting: Orthopedic Surgery

## 2020-07-04 DIAGNOSIS — E559 Vitamin D deficiency, unspecified: Secondary | ICD-10-CM | POA: Diagnosis not present

## 2020-07-04 DIAGNOSIS — M1612 Unilateral primary osteoarthritis, left hip: Secondary | ICD-10-CM | POA: Diagnosis not present

## 2020-07-04 DIAGNOSIS — K219 Gastro-esophageal reflux disease without esophagitis: Secondary | ICD-10-CM | POA: Diagnosis not present

## 2020-07-04 DIAGNOSIS — I739 Peripheral vascular disease, unspecified: Secondary | ICD-10-CM | POA: Diagnosis not present

## 2020-07-04 DIAGNOSIS — I081 Rheumatic disorders of both mitral and tricuspid valves: Secondary | ICD-10-CM | POA: Diagnosis not present

## 2020-07-04 DIAGNOSIS — M85852 Other specified disorders of bone density and structure, left thigh: Secondary | ICD-10-CM | POA: Diagnosis not present

## 2020-07-04 DIAGNOSIS — M1990 Unspecified osteoarthritis, unspecified site: Secondary | ICD-10-CM | POA: Diagnosis not present

## 2020-07-04 DIAGNOSIS — Z85828 Personal history of other malignant neoplasm of skin: Secondary | ICD-10-CM | POA: Diagnosis not present

## 2020-07-04 DIAGNOSIS — E785 Hyperlipidemia, unspecified: Secondary | ICD-10-CM | POA: Diagnosis not present

## 2020-07-04 DIAGNOSIS — H8110 Benign paroxysmal vertigo, unspecified ear: Secondary | ICD-10-CM | POA: Diagnosis not present

## 2020-07-04 LAB — BASIC METABOLIC PANEL
Anion gap: 4 — ABNORMAL LOW (ref 5–15)
BUN: 13 mg/dL (ref 8–23)
CO2: 26 mmol/L (ref 22–32)
Calcium: 8.1 mg/dL — ABNORMAL LOW (ref 8.9–10.3)
Chloride: 103 mmol/L (ref 98–111)
Creatinine, Ser: 0.49 mg/dL (ref 0.44–1.00)
GFR calc Af Amer: >60 mL/min (ref >60)
GFR calc non Af Amer: >60 mL/min (ref >60)
Glucose, Bld: 120 mg/dL — ABNORMAL HIGH (ref 70–99)
Potassium: 4.5 mmol/L (ref 3.5–5.1)
Sodium: 133 mmol/L — ABNORMAL LOW (ref 135–145)

## 2020-07-04 LAB — TYPE AND SCREEN
ABO/RH(D): O POS
Antibody Screen: POSITIVE
Unit division: 0
Unit division: 0

## 2020-07-04 LAB — CBC
HCT: 34.1 % — ABNORMAL LOW (ref 36.0–46.0)
Hemoglobin: 11.2 g/dL — ABNORMAL LOW (ref 12.0–15.0)
MCH: 32.2 pg (ref 26.0–34.0)
MCHC: 32.8 g/dL (ref 30.0–36.0)
MCV: 98 fL (ref 80.0–100.0)
Platelets: 123 10*3/uL — ABNORMAL LOW (ref 150–400)
RBC: 3.48 MIL/uL — ABNORMAL LOW (ref 3.87–5.11)
RDW: 12.2 % (ref 11.5–15.5)
WBC: 8.3 10*3/uL (ref 4.0–10.5)
nRBC: 0 % (ref 0.0–0.2)

## 2020-07-04 LAB — BPAM RBC
Blood Product Expiration Date: 202108242359
Blood Product Expiration Date: 202108252359
Unit Type and Rh: 5100
Unit Type and Rh: 5100

## 2020-07-04 MED ORDER — HYDROCODONE-ACETAMINOPHEN 5-325 MG PO TABS: 1.0000 | ORAL_TABLET | Freq: Four times a day (QID) | ORAL | 0 refills | Status: DC | PRN

## 2020-07-04 MED ORDER — RIVAROXABAN 10 MG PO TABS: 10.0000 mg | ORAL_TABLET | Freq: Every day | ORAL | 0 refills | Status: DC

## 2020-07-04 MED ORDER — SODIUM CHLORIDE 0.9 % IV BOLUS
500.0000 mL | Freq: Once | INTRAVENOUS | Status: AC
  Administered 2020-07-04: 500 mL via INTRAVENOUS

## 2020-07-04 MED ORDER — TRAMADOL HCL 50 MG PO TABS: 50.0000 mg | ORAL_TABLET | Freq: Four times a day (QID) | ORAL | 0 refills | Status: DC | PRN

## 2020-07-04 MED ORDER — METHOCARBAMOL 500 MG PO TABS: 500.0000 mg | ORAL_TABLET | Freq: Four times a day (QID) | ORAL | 0 refills | Status: DC | PRN

## 2020-07-04 NOTE — Progress Notes (Signed)
Physical Therapy Treatment Patient Details Name: Elizabeth Douglas MRN: [id number] DOB: 1951-11-11 Today's Date: 07/04/2020    History of Present Illness Patient is 69 y.o. female s/p Lt THA anterior approach with PMH significant for PVD, osteopenia, DVT, OA, GERD, Rt THA in 2019.    PT Comments    Patient is progressing well with acute therapy and was able to advance gait training today with supervision using RW. Pt tolerated increased distance of  ~170' and stair training with no c/o dizziness and BP stable throughout. Pt's husband educated on safe guarding during gait and stair mobility. Pt completed 3 steps with sidestep technique and bil UE support on hand rail. Patient also educated on HEP for gentle ROM and strengthening. Education provided for safe car transfer technique and use of gait belt as leg lifter if needed. Patient has met mobility at safe level for discharge home with assist from husband. Acute PT will follow throughout her acute stay.    Follow Up Recommendations  Follow surgeon's recommendation for DC plan and follow-up therapies     Equipment Recommendations  None recommended by PT    Recommendations for Other Services       Precautions / Restrictions Precautions Precautions: Fall Restrictions Weight Bearing Restrictions: No Other Position/Activity Restrictions: WBAT    Mobility  Bed Mobility Overal bed mobility: Needs Assistance        General bed mobility comments: pt OOB at start of session and ended in bed.  Transfers Overall transfer level: Needs assistance Equipment used: Rolling walker (2 wheeled) Transfers: Sit to/from Stand Sit to Stand: Supervision         General transfer comment: Pt with good carryover for safe hand placement on RW, no assist required for power up and no cue for safe reach back.   Ambulation/Gait Ambulation/Gait assistance: Supervision Gait Distance (Feet): 170 Feet Assistive device: Rolling walker (2 wheeled) Gait  Pattern/deviations: Step-to pattern;Decreased stride length Gait velocity: decr   General Gait Details: Pt with good carryover for safe step pattern and proximity to RW, cues to keep walker on the ground at start and t maintained throughout. no overt LOB noted, pt's husband educated on safe guarding position.   Stairs Stairs: Yes Stairs assistance: Min guard;Supervision Stair Management: One rail Left;Step to pattern;Sideways Number of Stairs: 3 General stair comments: Vc's for step sequencing and demonstration at start. Pt's husband provided safe guarding and pt performed step up "up with Rt, down with Lt" with no overt LOB.   Wheelchair Mobility    Modified Rankin (Stroke Patients Only)       Balance Overall balance assessment: Needs assistance Sitting-balance support: Feet supported Sitting balance-Leahy Scale: Good     Standing balance support: During functional activity;Bilateral upper extremity supported Standing balance-Leahy Scale: Fair           Cognition Arousal/Alertness: Awake/alert Behavior During Therapy: WFL for tasks assessed/performed Overall Cognitive Status: Within Functional Limits for tasks assessed           Exercises Total Joint Exercises Ankle Circles/Pumps: AROM;20 reps;Seated;Both Quad Sets: AROM;Left;5 reps;Seated Short Arc Quad: AROM;Left;5 reps;Seated Heel Slides: AROM;Left;5 reps;Seated Hip ABduction/ADduction: AROM;Left;5 reps;Seated Long Arc Quad: AROM;Left;5 reps;Seated    General Comments        Pertinent Vitals/Pain Pain Assessment: 0-10 Pain Score: 1  Pain Location: Lt hip Pain Descriptors / Indicators: Burning Pain Intervention(s): Limited activity within patient's tolerance;Monitored during session;Repositioned;Ice applied    Home Living Family/patient expects to be discharged to:: Private residence Living Arrangements:  Spouse/significant other Available Help at Discharge: Family Type of Home: House Home Access:  Stairs to enter Entrance Stairs-Rails: Left;Right Home Layout: Two level Home Equipment: Environmental consultant - 2 wheels;Cane - single point;Bedside commode;Shower seat - built in      Prior Function Level of Independence: Independent      Comments: pt enjoys walking ~2.5 miles per day and going hiking with family.   PT Goals (current goals can now be found in the care plan section) Acute Rehab PT Goals Patient Stated Goal: get back to hiking for exercise PT Goal Formulation: With patient Time For Goal Achievement: 07/10/20 Potential to Achieve Goals: Good Progress towards PT goals: Progressing toward goals    Frequency    7X/week      PT Plan Current plan remains appropriate    AM-PAC PT "6 Clicks" Mobility   Outcome Measure  Help needed turning from your back to your side while in a flat bed without using bedrails?: A Little Help needed moving from lying on your back to sitting on the side of a flat bed without using bedrails?: A Little Help needed moving to and from a bed to a chair (including a wheelchair)?: A Little Help needed standing up from a chair using your arms (e.g., wheelchair or bedside chair)?: A Little Help needed to walk in hospital room?: A Little Help needed climbing 3-5 steps with a railing? : A Little 6 Click Score: 18    End of Session Equipment Utilized During Treatment: Gait belt Activity Tolerance: Patient tolerated treatment well Patient left: in chair;with call bell/phone within reach;with chair alarm set;with family/visitor present Nurse Communication: Mobility status;Patient requests pain meds (orthostatic and request for tylenol) PT Visit Diagnosis: Muscle weakness (generalized) (M62.81);Difficulty in walking, not elsewhere classified (R26.2)     Time: 6486-1612 PT Time Calculation (min) (ACUTE ONLY): 37 min  Charges:  $Gait Training: 8-22 mins $Therapeutic Exercise: 8-22 mins                     Verner Mould, DPT Acute Rehabilitation  Services  Office [phone] Pager [phone]  07/04/2020 10:38 AM

## 2020-07-04 NOTE — Progress Notes (Signed)
Met with pt and confirming no DME needs and plans HEP for home. No TOC needs identified.  Alexandre Faries, LCSW

## 2020-07-04 NOTE — Progress Notes (Addendum)
   Subjective: 1 Day Post-Op Procedure(s) (LRB): TOTAL HIP ARTHROPLASTY ANTERIOR APPROACH (Left) Patient reports pain as mild.   Patient seen in rounds with Dr. Wynelle Link. Patient is feeling well this AM. BP soft, history of orthostatic hypotension following previous surgery. 500 mL bolus ordered yesterday, will order a second bolus to be given this AM. Denies chest pain, SOB, or calf pain. Foley catheter to be removed this AM. Plan is to go Home after hospital stay.  Objective: Vital signs in last 24 hours: Temp:  [96.6 F (35.9 C)-98.6 F (37 C)] 98.6 F (37 C) (08/05 0556) Pulse Rate:  [54-90] 56 (08/05 0556) Resp:  [11-18] 18 (08/05 0556) BP: (83-122)/(52-83) 83/52 (08/05 0556) SpO2:  [95 %-100 %] 95 % (08/05 0556) Weight:  [68.1 kg] 68.1 kg (08/04 1253)  Intake/Output from previous day:  Intake/Output Summary (Last 24 hours) at 07/04/2020 0730 Last data filed at 07/04/2020 0556 Gross per 24 hour  Intake 3064.76 ml  Output 1425 ml  Net 1639.76 ml    Intake/Output this shift: No intake/output data recorded.  Labs: Recent Labs    07/04/20 0240  HGB 11.2*   Recent Labs    07/04/20 0240  WBC 8.3  RBC 3.48*  HCT 34.1*  PLT 123*   Recent Labs    07/04/20 0240  NA 133*  K 4.5  CL 103  CO2 26  BUN 13  CREATININE 0.49  GLUCOSE 120*  CALCIUM 8.1*   No results for input(s): LABPT, INR in the last 72 hours.  Exam: General - Patient is Alert and Oriented Extremity - Neurologically intact Neurovascular intact Sensation intact distally Dorsiflexion/Plantar flexion intact Dressing/Incision - clean, dry, no drainage Motor Function - intact, moving foot and toes well on exam.   Past Medical History:  Diagnosis Date  . Anal fissure   . Arthritis    hip  . Atypical chest pain   . Basal cell carcinoma    Skin cancer face  . Complication of anesthesia    BP drops and passes out  [place] vomiting  . DVT (deep venous thrombosis) (HCC)    Right Leg  . Excess or  deficiency of vitamin D   . Gallbladder polyp   . GERD (gastroesophageal reflux disease)   . Hemorrhoids   . Microhematuria    pt. denies  . Osteopenia   . Peripheral vascular disease ([place])   . PONV (postoperative nausea and vomiting)   . Thrombocytopenia (HCC)     Assessment/Plan: 1 Day Post-Op Procedure(s) (LRB): TOTAL HIP ARTHROPLASTY ANTERIOR APPROACH (Left) Active Problems:   Primary osteoarthritis of left hip  Estimated body mass index is 23.51 kg/m as calculated from the following:   Height as of this encounter: 5\' 7"  (1.702 m).   Weight as of this encounter: 68.1 kg. Advance diet Up with therapy  DVT Prophylaxis - Xarelto Weight-bearing as tolerated  Plan for discharge later today if meeting goals with physical therapy and BP stable with HEP. Follow-up in the office on August 17th.  The PDMP database was reviewed today (07/04/2020) prior to any opioid medications being prescribed to this patient.   Theresa Duty, PA-C Orthopedic Surgery [phone] 07/04/2020, 7:30 AM

## 2020-08-06 DIAGNOSIS — Z96642 Presence of left artificial hip joint: Secondary | ICD-10-CM | POA: Diagnosis not present

## 2020-08-06 DIAGNOSIS — Z471 Aftercare following joint replacement surgery: Secondary | ICD-10-CM | POA: Diagnosis not present

## 2020-08-08 NOTE — Progress Notes (Signed)
HISTORY AND PHYSICAL     CC:  follow up. Requesting Provider:  Marin Olp, MD  HPI: This is a 69 y.o. female who is here today for follow up for PAD.  She has hx of right above-the-knee to below-knee popliteal artery bypass for acute limb ischemia with Dr. Bridgett Larsson in 2015.  Pt was last seen January 2021 by Dr. Donzetta Matters and at that time, she was back to walking without limitation and overall doing well.  She did have some diminished velocities but they were comparable to the previous studies at which time she had angiography and they were noted to be normal.  She was scheduled for 6 month follow up.   The pt returns today for that visit.  On 07/03/2020, she did undergo a left total hip arthroplasty with anterior approach by Dr. Maureen Ralphs.  She was on Xarelto for 3 weeks after surgery and she has finished this. She has done quite well with her surgeries.    She denies any pain with walking or non healing wounds or rest pain.  She states that Dr. Yong Channel has checked her cholesterol levels and did not feel that she needed a statin unless Dr. Donzetta Matters felt like she needed it.    The pt is not on a statin for cholesterol management.    The pt is on an aspirin.    Other AC:  none The pt is not on medication for hypertension.  The pt does not have diabetes. Tobacco hx:  never  Pt does not have family hx of AAA.  Past Medical History:  Diagnosis Date  . Anal fissure   . Arthritis    hip  . Atypical chest pain   . Basal cell carcinoma    Skin cancer face  . Complication of anesthesia    BP drops and passes out  [place] vomiting  . DVT (deep venous thrombosis) (HCC)    Right Leg  . Excess or deficiency of vitamin D   . Gallbladder polyp   . GERD (gastroesophageal reflux disease)   . Hemorrhoids   . Microhematuria    pt. denies  . Osteopenia   . Peripheral vascular disease ([place])   . PONV (postoperative nausea and vomiting)   . Thrombocytopenia ([place])     Past Surgical History:  Procedure  Laterality Date  . ABDOMINAL AORTOGRAM W/LOWER EXTREMITY Right 12/22/2018   Procedure: ABDOMINAL AORTOGRAM W/LOWER EXTREMITY [place];  Surgeon: Waynetta Sandy, MD;  Location: [facility];  Service: Cardiovascular;  Laterality: Right;  . COLONOSCOPY  2005, 2015  . INTRAOPERATIVE ARTERIOGRAM Right 11/29/2015   Procedure: INTRA OPERATIVE ARTERIOGRAM;  Surgeon: Conrad Leo-[place]edar[place], MD;  Location: [facility];  Service: Vascular;  Laterality: Right;  . JOINT REPLACEMENT     right total hip [place] 02-16-18  . LUNG BIOPSY    . THROMBECTOMY FEMORAL ARTERY Right 11/29/2015   Procedure:  RIGHT  POPLITEAL to anterior tibial and posterior tibial ARTERY thrombectomy. Right above knee to below knee popliteal artery bypass;  Surgeon: Conrad Paraje, MD;  Location: [facility];  Service: Vascular;  Laterality: Right;  . TOTAL HIP ARTHROPLASTY Right 02/16/2018   Procedure: RIGHT TOTAL HIP ARTHROPLASTY ANTERIOR APPROACH;  Surgeon: Gaynelle Arabian, MD;  Location: [facility];  Service: Orthopedics;  Laterality: Right;  . TOTAL HIP ARTHROPLASTY Left 07/03/2020   Procedure: TOTAL HIP ARTHROPLASTY ANTERIOR APPROACH;  Surgeon: Gaynelle Arabian, MD;  Location: [facility];  Service: Orthopedics;  Laterality: Left;  175min  . UPPER  GASTROINTESTINAL ENDOSCOPY  2009    Allergies  Allergen Reactions  . Fish Allergy Anaphylaxis and Swelling    Pt felt like throat was closing last time she ate   . Iodixanol Rash    Contrast Dye     Current Outpatient Medications  Medication Sig Dispense Refill  . acetaminophen (TYLENOL) 500 MG tablet Take 1,000 mg by mouth at bedtime as needed for moderate pain.     . Calcium Carbonate-Simethicone (MAALOX MAX) 1000-60 MG CHEW Chew 1 each by mouth daily as needed (heartburn).    . Carboxymethylcellul-Glycerin (LUBRICATING EYE DROPS OP) Place 1 drop into both eyes daily.    Marland Kitchen HYDROcodone-acetaminophen (NORCO/VICODIN) 5-325 MG tablet Take 1-2 tablets by mouth every 6 (six) hours as needed for severe pain.  56 tablet 0  . methocarbamol (ROBAXIN) 500 MG tablet Take 1 tablet (500 mg total) by mouth every 6 (six) hours as needed for muscle spasms. 40 tablet 0  . omeprazole (PRILOSEC) 20 MG capsule TAKE 1 CAPSULE BY MOUTH TWICE A DAY (Patient taking differently: Take 20 mg by mouth daily. ) 180 capsule 1  . rivaroxaban (XARELTO) 10 MG TABS tablet Take 1 tablet (10 mg total) by mouth daily with breakfast for 20 days. Then resume one 81 mg aspirin once a day. 20 tablet 0  . traMADol (ULTRAM) 50 MG tablet Take 1-2 tablets (50-100 mg total) by mouth every 6 (six) hours as needed for moderate pain. 40 tablet 0  . Wheat Dextrin (BENEFIBER) POWD Take 1 Dose by mouth daily.      No current facility-administered medications for this visit.    Family History  Problem Relation Age of Onset  . COPD Mother   . Osteoporosis Mother   . Prostate cancer Father   . Heart disease Brother 24       Before age 67  . Heart attack Brother 42       Massive  . Lymphoma Brother        non and hodgkins  . Coronary artery disease Brother   . Colon cancer Neg Hx   . Colon polyps Neg Hx   . Rectal cancer Neg Hx   . Stomach cancer Neg Hx     Social History   Socioeconomic History  . Marital status: Married    Spouse name: Not on file  . Number of children: 1  . Years of education: Not on file  . Highest education level: Not on file  Occupational History  . Occupation: Retired  Tobacco Use  . Smoking status: Never Smoker  . Smokeless tobacco: Never Used  Vaping Use  . Vaping Use: Never used  Substance and Sexual Activity  . Alcohol use: No    Alcohol/week: 0.0 standard drinks  . Drug use: No  . Sexual activity: Yes    Birth control/protection: Post-menopausal  Other Topics Concern  . Not on file  Social History Narrative   Married. 1 daughter. No grandkids. 1 granddog and 1 grandcat.       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       Hobbies:  walking 2.5 miles  [place], stays active   Social Determinants of Health   Financial Resource Strain:   . Difficulty of Paying Living Expenses: Not on file  Food Insecurity:   . Worried About Charity fundraiser in the Last Year: Not on file  . Ran Out of Food in the Last  Year: Not on file  Transportation Needs:   . Lack of Transportation (Medical): Not on file  . Lack of Transportation (Non-Medical): Not on file  Physical Activity:   . Days of Exercise per Week: Not on file  . Minutes of Exercise per Session: Not on file  Stress:   . Feeling of Stress : Not on file  Social Connections:   . Frequency of Communication with Friends and Family: Not on file  . Frequency of Social Gatherings with Friends and Family: Not on file  . Attends Religious Services: Not on file  . Active Member of Clubs or Organizations: Not on file  . Attends Archivist Meetings: Not on file  . Marital Status: Not on file  Intimate Partner Violence:   . Fear of Current or Ex-Partner: Not on file  . Emotionally Abused: Not on file  . Physically Abused: Not on file  . Sexually Abused: Not on file     REVIEW OF SYSTEMS:   [X]  denotes positive finding, [ ]  denotes negative finding Cardiac  Comments:  Chest pain or chest pressure:    Shortness of breath upon exertion:    Short of breath when lying flat:    Irregular heart rhythm:        Vascular    Pain in calf, thigh, or hip brought on by ambulation:    Pain in feet at night that wakes you up from your sleep:     Blood clot in your veins:    Leg swelling:         Pulmonary    Oxygen at home:    Productive cough:     Wheezing:         Neurologic    Sudden weakness in arms or legs:     Sudden numbness in arms or legs:     Sudden onset of difficulty speaking or slurred speech:    Temporary loss of vision in one eye:     Problems with dizziness:         Gastrointestinal    Blood in stool:     Vomited blood:         Genitourinary     Burning when urinating:     Blood in urine:        Psychiatric    Major depression:         Hematologic    Bleeding problems:    Problems with blood clotting too easily:        Skin    Rashes or ulcers:        Constitutional    Fever or chills:      PHYSICAL EXAMINATION:  Today's Vitals   08/09/20 1100  BP: 109/76  Pulse: 69  Resp: 20  Temp: 98.2 F (36.8 C)  TempSrc: Temporal  SpO2: 98%  Weight: 150 lb 9.6 oz (68.3 kg)  Height: 5\' 7"  (1.702 m)   Body mass index is 23.59 kg/m.   General:  WDWN in NAD; vital signs documented above Gait: Not observed HENT: WNL, normocephalic Pulmonary: normal non-labored breathing , without wheezing Cardiac: regular HR, without  Murmur; without carotid bruits Abdomen: soft, NT, no masses Skin: without rashes Vascular Exam/Pulses:  Right Left  Radial 2+ (normal) 2+ (normal)  Ulnar 2+ (normal) 1+ (weak)  Popliteal Unable to palpate Unable to palpate  DP 2+ (normal) 2+ (normal)  PT 1+ (weak) 1+ (weak)   Extremities: without ischemic changes, without Gangrene , without cellulitis; without  open wounds;  Musculoskeletal: no muscle wasting or atrophy  Neurologic: A&O X 3;  No focal weakness or paresthesias are detected Psychiatric:  The pt has Normal affect.   Non-Invasive Vascular Imaging:   ABI's/TBI's on 08/09/2020: Right:  1.19/0.74 (T) - Great toe pressure: 93 Left:  1.22/0.70 (T) - Great toe pressure:  88  Arterial duplex on 08/09/2020: Right Graft #1:  +------------------+--------+--------+---------+--------+           PSV cm/sStenosisWaveform Comments  +------------------+--------+--------+---------+--------+  Inflow      60       biphasic       +------------------+--------+--------+---------+--------+  Prox Anastomosis 48       biphasic       +------------------+--------+--------+---------+--------+  Proximal Graft  22       biphasic        +------------------+--------+--------+---------+--------+  Mid Graft     29       biphasic       +------------------+--------+--------+---------+--------+  Distal Graft   29       triphasic      +------------------+--------+--------+---------+--------+  Distal Anastomosis53       triphasic      +------------------+--------+--------+---------+--------+  Outflow      60       triphasic      +------------------+--------+--------+---------+--------+   Summary:  Right: Widely patent above knee to below knee popliteal artery bypass  graft.   Previous ABI's/TBI's on 12/29/2019: Right:  1.14/0.99 (T) - Great toe pressure: 117  Left:  1.19/0.88 (T)  - Great toe pressure: 104  Previous arterial duplex on 12/29/2019: Right Graft #1:  +------------------+--------+--------+---------+--------+           PSV cm/sStenosisWaveform Comments  +------------------+--------+--------+---------+--------+  Inflow      64       triphasic      +------------------+--------+--------+---------+--------+  Prox Anastomosis 53       triphasic      +------------------+--------+--------+---------+--------+  Proximal Graft  63       triphasic      +------------------+--------+--------+---------+--------+  Mid Graft     33       triphasic      +------------------+--------+--------+---------+--------+  Distal Graft   29       triphasic      +------------------+--------+--------+---------+--------+  Distal Anastomosis75       triphasic      +------------------+--------+--------+---------+--------+  Outflow      66       triphasic      +------------------+--------+--------+---------+--------+   Summary:  Right: Patent above knee to below knee popliteal bypass graft with no  evidence of  stenosis.    ASSESSMENT/PLAN:: 69 y.o. female here for follow up for  right above-the-knee to below-knee popliteal artery bypass for acute limb ischemia with Dr. Bridgett Larsson in 2015 now followed by Dr. Donzetta Matters  -pt continues to do well.  Her velocities on arterial duplex today remained decreased but essentially stable from prior exam.  Discussed with Dr. Donzetta Matters and he reviewed her aortogram from January 2020.  She continues to have palpable pulses.  She will continue to follow up in 6 months with ABI and right lower extremity arterial duplex.  She will contact us sooner if she develops sudden onset of pain, rest pain or non healing wounds.  Also discussed with Dr. Donzetta Matters and pt does not need to be on a statin unless Dr. Yong Channel feels she needs it.   -she will see Dr. Donzetta Matters at her next visit.  Will continue to alternate between PA and Dr. Donzetta Matters. -  pt knows to contact us sooner if she has any issues before her next visit.  -continue baby aspirin.    Leontine Locket, [facility] [phone]  Clinic MD:   Donzetta Matters

## 2020-08-09 ENCOUNTER — Ambulatory Visit: Payer: Medicare HMO | Admitting: Physician Assistant

## 2020-08-09 ENCOUNTER — Ambulatory Visit (INDEPENDENT_AMBULATORY_CARE_PROVIDER_SITE_OTHER)
Admission: RE | Admit: 2020-08-09 | Discharge: 2020-08-09 | Disposition: A | Discharge status: Home / Self Care | Payer: Medicare HMO | Source: Ambulatory Visit

## 2020-08-09 ENCOUNTER — Other Ambulatory Visit: Payer: Self-pay

## 2020-08-09 ENCOUNTER — Ambulatory Visit (HOSPITAL_COMMUNITY)
Admission: RE | Admit: 2020-08-09 | Discharge: 2020-08-09 | Disposition: A | Discharge status: Home / Self Care | Payer: Medicare HMO | Source: Ambulatory Visit | Attending: Vascular Surgery | Admitting: Vascular Surgery

## 2020-08-09 VITALS — BP 109/76 | HR 69 | Temp 98.2°F | Resp 20 | Ht 67.0 in | Wt 150.6 lb

## 2020-08-09 DIAGNOSIS — Z95828 Presence of other vascular implants and grafts: Secondary | ICD-10-CM

## 2020-08-09 DIAGNOSIS — I739 Peripheral vascular disease, unspecified: Secondary | ICD-10-CM | POA: Diagnosis present

## 2020-08-14 ENCOUNTER — Other Ambulatory Visit: Payer: Self-pay | Admitting: *Deleted

## 2020-08-14 DIAGNOSIS — I739 Peripheral vascular disease, unspecified: Secondary | ICD-10-CM

## 2020-08-14 DIAGNOSIS — I70201 Unspecified atherosclerosis of native arteries of extremities, right leg: Secondary | ICD-10-CM

## 2020-08-30 ENCOUNTER — Other Ambulatory Visit: Payer: Self-pay | Admitting: Family Medicine

## 2020-09-17 DIAGNOSIS — Z471 Aftercare following joint replacement surgery: Secondary | ICD-10-CM | POA: Diagnosis not present

## 2020-09-17 DIAGNOSIS — R69 Illness, unspecified: Secondary | ICD-10-CM | POA: Diagnosis not present

## 2020-09-17 DIAGNOSIS — M25551 Pain in right hip: Secondary | ICD-10-CM | POA: Diagnosis not present

## 2020-09-17 DIAGNOSIS — Z96643 Presence of artificial hip joint, bilateral: Secondary | ICD-10-CM | POA: Diagnosis not present

## 2020-09-17 DIAGNOSIS — M545 Low back pain, unspecified: Secondary | ICD-10-CM | POA: Diagnosis not present

## 2020-09-17 DIAGNOSIS — Z96641 Presence of right artificial hip joint: Secondary | ICD-10-CM | POA: Diagnosis not present

## 2020-10-29 DIAGNOSIS — R69 Illness, unspecified: Secondary | ICD-10-CM | POA: Diagnosis not present

## 2020-11-27 ENCOUNTER — Encounter: Payer: Self-pay | Admitting: Family Medicine

## 2020-11-27 ENCOUNTER — Other Ambulatory Visit: Payer: Self-pay

## 2020-11-27 ENCOUNTER — Ambulatory Visit (INDEPENDENT_AMBULATORY_CARE_PROVIDER_SITE_OTHER): Payer: Medicare HMO | Admitting: Family Medicine

## 2020-11-27 VITALS — BP 110/70 | HR 78 | Temp 97.5°F | Ht 67.0 in | Wt 150.6 lb

## 2020-11-27 DIAGNOSIS — B029 Zoster without complications: Secondary | ICD-10-CM | POA: Diagnosis not present

## 2020-11-27 MED ORDER — VALACYCLOVIR HCL 1 G PO TABS
1000.0000 mg | ORAL_TABLET | Freq: Three times a day (TID) | ORAL | 0 refills | Status: DC
Start: 2020-11-27 — End: 2021-02-07

## 2020-11-27 NOTE — Progress Notes (Signed)
Patient: Elizabeth Douglas MRN: [id number] DOB: 14-Feb-1951 PCP: Shelva Majestic, MD     Subjective:  Chief Complaint  Patient presents with  . Rash    HPI: The patient is a 70 y.o. female who presents today for rash. She says that it started on the left side of her back. It is now spreading to the left side of her abdomen. She thinks it started about 3-4 days ago. It is raised on red base and in clusters. She has some burning in her stomach and to the touch. Her back is itchy, slightly sensitive to touch, but not really painful. Still getting new lesions.   She got her booster at beginning of December. No other illness.   Review of Systems  Constitutional: Negative for chills and fever.  Skin: Positive for color change and rash.    Allergies Patient is allergic to fish allergy and iodixanol.  Past Medical History Patient  has a past medical history of Anal fissure, Arthritis, Atypical chest pain, Basal cell carcinoma, Complication of anesthesia, DVT (deep venous thrombosis) (HCC), Excess or deficiency of vitamin D, Gallbladder polyp, GERD (gastroesophageal reflux disease), Hemorrhoids, Microhematuria, Osteopenia, Peripheral vascular disease (HCC), PONV (postoperative nausea and vomiting), and Thrombocytopenia (HCC).  Surgical History Patient  has a past surgical history that includes Lung biopsy; Colonoscopy (2005, 2015); Upper gastrointestinal endoscopy (2009); Thrombectomy femoral artery (Right, 11/29/2015); Intraoperative arteriogram (Right, 11/29/2015); Joint replacement; Total hip arthroplasty (Right, 02/16/2018); ABDOMINAL AORTOGRAM W/LOWER EXTREMITY (Right, 12/22/2018); and Total hip arthroplasty (Left, 07/03/2020).  Family History Pateint's family history includes COPD in her mother; Coronary artery disease in her brother; Heart attack (age of onset: 92) in her brother; Heart disease (age of onset: 22) in her brother; Lymphoma in her brother; Osteoporosis in her mother; Prostate  cancer in her father.  Social History Patient  reports that she has never smoked. She has never used smokeless tobacco. She reports that she does not drink alcohol and does not use drugs.    Objective: Vitals:   11/27/20 1112  BP: 110/70  Pulse: 78  Temp: (!) 97.5 F (36.4 C)  TempSrc: Temporal  SpO2: 96%  Weight: 150 lb 9.6 oz (68.3 kg)  Height: 5\' 7"  (1.702 m)    Body mass index is 23.59 kg/m.  Physical Exam Vitals reviewed.  Constitutional:      Appearance: Normal appearance. She is normal weight.  HENT:     Head: Normocephalic and atraumatic.  Pulmonary:     Effort: Pulmonary effort is normal.  Skin:    Findings: Rash (vesicular rash in clusters on erythematous base along dermatomal pattern on back and along stomach near umbilicus ) present.  Neurological:     Mental Status: She is alert.        Assessment/plan: 1. Herpes zoster without complication Likely secondary to booster shot. Still having lesions and discussed in time frame for treatment. 7 day course of valtrex, cool compresses and recommended capsaicin cream as long as lesions not broken open. She has no pain so can continue tylenol prn since can not tolerate ibuprofen. If worsening pain she is to contact me. Avoid immunocompromised people while new lesions appearing. Handout given.    This visit occurred during the SARS-CoV-2 public health emergency.  Safety protocols were in place, including screening questions prior to the visit, additional usage of staff PPE, and extensive cleaning of exam room while observing appropriate contact time as indicated for disinfecting solutions.     Return if symptoms worsen or  fail to improve.   Orland Mustard, MD [facility]   11/27/2020

## 2020-11-27 NOTE — Patient Instructions (Signed)
If lesions not open can try tiger balm over the counter (capsician cream). It's a nerve cream.   Tylenol and cold compresses for pain and email me if pain is getting worse.   So nice to meet you! Dr. Artis Flock   Shingles  Shingles is an infection. It gives you a painful skin rash and blisters that have fluid in them. Shingles is caused by the same germ (virus) that causes chickenpox. Shingles only happens in people who:  Have had chickenpox.  Have been given a shot of medicine (vaccine) to protect against chickenpox. Shingles is rare in this group. The first symptoms of shingles may be itching, tingling, or pain in an area on your skin. A rash will show on your skin a few days or weeks later. The rash is likely to be on one side of your body. The rash usually has a shape like a belt or a band. Over time, the rash turns into fluid-filled blisters. The blisters will break open, change into scabs, and dry up. Medicines may:  Help with pain and itching.  Help you get better sooner.  Help to prevent long-term problems. Follow these instructions at home: Medicines  Take over-the-counter and prescription medicines only as told by your doctor.  Put on an anti-itch cream or numbing cream where you have a rash, blisters, or scabs. Do this as told by your doctor. Helping with itching and discomfort   Put cold, wet cloths (cold compresses) on the area of the rash or blisters as told by your doctor.  Cool baths can help you feel better. Try adding baking soda or dry oatmeal to the water to lessen itching. Do not bathe in hot water. Blister and rash care  Keep your rash covered with a loose bandage (dressing).  Wear loose clothing that does not rub on your rash.  Keep your rash and blisters clean. To do this, wash the area with mild soap and cool water as told by your doctor.  Check your rash every day for signs of infection. Check for: ? More redness, swelling, or pain. ? Fluid or  blood. ? Warmth. ? Pus or a bad smell.  Do not scratch your rash. Do not pick at your blisters. To help you to not scratch: ? Keep your fingernails clean and cut short. ? Wear gloves or mittens when you sleep, if scratching is a problem. General instructions  Rest as told by your doctor.  Keep all follow-up visits as told by your doctor. This is important.  Wash your hands often with soap and water. If soap and water are not available, use hand sanitizer. Doing this lowers your chance of getting a skin infection caused by germs (bacteria).  Your infection can cause chickenpox in people who have never had chickenpox or never got a shot of chickenpox vaccine. If you have blisters that did not change into scabs yet, try not to touch other people or be around other people, especially: ? Babies. ? Pregnant women. ? Children who have areas of red, itchy, or rough skin (eczema). ? Very old people who have transplants. ? People who have a long-term (chronic) sickness, like cancer or AIDS. Contact a doctor if:  Your pain does not get better with medicine.  Your pain does not get better after the rash heals.  You have any signs of infection in the rash area. These signs include: ? More redness, swelling, or pain around the rash. ? Fluid or blood coming from the  rash. ? The rash area feeling warm to the touch. ? Pus or a bad smell coming from the rash. Get help right away if:  The rash is on your face or nose.  You have pain in your face or pain by your eye.  You lose feeling on one side of your face.  You have trouble seeing.  You have ear pain, or you have ringing in your ear.  You have a loss of taste.  Your condition gets worse. Summary  Shingles gives you a painful skin rash and blisters that have fluid in them.  Shingles is an infection. It is caused by the same germ (virus) that causes chickenpox.  Keep your rash covered with a loose bandage (dressing). Wear loose  clothing that does not rub on your rash.  If you have blisters that did not change into scabs yet, try not to touch other people or be around people. This information is not intended to replace advice given to you by your health care provider. Make sure you discuss any questions you have with your health care provider. Document Revised: 03/10/2019 Document Reviewed: 07/21/2017 Elsevier Patient Education  2020 [employer].

## 2020-11-28 ENCOUNTER — Ambulatory Visit: Payer: Medicare HMO | Admitting: Family Medicine

## 2020-12-05 DIAGNOSIS — H5203 Hypermetropia, bilateral: Secondary | ICD-10-CM | POA: Diagnosis not present

## 2021-01-23 DIAGNOSIS — M7541 Impingement syndrome of right shoulder: Secondary | ICD-10-CM | POA: Diagnosis not present

## 2021-01-23 DIAGNOSIS — M19012 Primary osteoarthritis, left shoulder: Secondary | ICD-10-CM | POA: Diagnosis not present

## 2021-01-23 DIAGNOSIS — M542 Cervicalgia: Secondary | ICD-10-CM | POA: Diagnosis not present

## 2021-01-23 DIAGNOSIS — M47812 Spondylosis without myelopathy or radiculopathy, cervical region: Secondary | ICD-10-CM | POA: Diagnosis not present

## 2021-01-28 ENCOUNTER — Other Ambulatory Visit: Payer: Self-pay

## 2021-01-28 ENCOUNTER — Ambulatory Visit: Payer: Medicare HMO | Attending: Orthopedic Surgery | Admitting: Physical Therapy

## 2021-01-28 DIAGNOSIS — G8929 Other chronic pain: Secondary | ICD-10-CM | POA: Diagnosis not present

## 2021-01-28 DIAGNOSIS — M6281 Muscle weakness (generalized): Secondary | ICD-10-CM

## 2021-01-28 DIAGNOSIS — M25511 Pain in right shoulder: Secondary | ICD-10-CM | POA: Diagnosis not present

## 2021-01-28 NOTE — Therapy (Signed)
[facility] [place], [place], [address] Phone: [phone]   Fax:  [phone]  Physical Therapy Evaluation  Patient Details  Name: Elizabeth Douglas MRN: [id number] Date of Birth: [birth year] Referring Provider (PT): Esmond Plants MD   Encounter Date: 01/28/2021   PT End of Session - 01/28/21 1041    Visit Number 1    Number of Visits 12    Date for PT Re-Evaluation 02/25/21    Authorization Type FOTO AT LEAST EVERY 5TH VISIT.  PROGRESS NOTE AT 10TH VISIT.  KX MODIFIER AFTER 15 VISITS.    PT Start Time 0900    PT Stop Time 0947    PT Time Calculation (min) 47 min    Activity Tolerance Patient tolerated treatment well    Behavior During Therapy WFL for tasks assessed/performed           Past Medical History:  Diagnosis Date  . Anal fissure   . Arthritis    hip  . Atypical chest pain   . Basal cell carcinoma    Skin cancer face  . Complication of anesthesia    BP drops and passes out  [place] vomiting  . DVT (deep venous thrombosis) (HCC)    Right Leg  . Excess or deficiency of vitamin D   . Gallbladder polyp   . GERD (gastroesophageal reflux disease)   . Hemorrhoids   . Microhematuria    pt. denies  . Osteopenia   . Peripheral vascular disease ([place])   . PONV (postoperative nausea and vomiting)   . Thrombocytopenia ([place])     Past Surgical History:  Procedure Laterality Date  . ABDOMINAL AORTOGRAM W/LOWER EXTREMITY Right 12/22/2018   Procedure: ABDOMINAL AORTOGRAM W/LOWER EXTREMITY Runoff;  Surgeon: Waynetta Sandy, MD;  Location: [place] [place] [facility];  Service: Cardiovascular;  Laterality: Right;  . COLONOSCOPY  2005, 2015  . INTRAOPERATIVE ARTERIOGRAM Right 11/29/2015   Procedure: INTRA OPERATIVE ARTERIOGRAM;  Surgeon: Conrad [place], MD;  Location: [place];  Service: Vascular;  Laterality: Right;  . JOINT REPLACEMENT     right total hip Aluisio 02-16-18  . LUNG BIOPSY    . THROMBECTOMY FEMORAL ARTERY  Right 11/29/2015   Procedure:  RIGHT  POPLITEAL to anterior tibial and posterior tibial ARTERY thrombectomy. Right above knee to below knee popliteal artery bypass;  Surgeon: Conrad [place], MD;  Location: [place];  Service: Vascular;  Laterality: Right;  . TOTAL HIP ARTHROPLASTY Right 02/16/2018   Procedure: RIGHT TOTAL HIP ARTHROPLASTY ANTERIOR APPROACH;  Surgeon: Gaynelle Arabian, MD;  Location: [facility];  Service: Orthopedics;  Laterality: Right;  . TOTAL HIP ARTHROPLASTY Left 07/03/2020   Procedure: TOTAL HIP ARTHROPLASTY ANTERIOR APPROACH;  Surgeon: Gaynelle Arabian, MD;  Location: [facility];  Service: Orthopedics;  Laterality: Left;  129min  . UPPER GASTROINTESTINAL ENDOSCOPY  2009    There were no vitals filed for this visit.    Subjective Assessment - 01/28/21 1026    Subjective COVID-19 screen performed prior to patient entering clinic.  The patient presenst to the clinic today with c/o right shoulder pain that has been ongoing for several months.  A recent injection was quite helpful and her resting pain-level is a 2/10 today.  Overhead movements increase pain.  Rest decreases pain.    Pertinent History Right THA(3/19), left THA(8/21).    Patient Stated Goals Use right UE without pain.    Currently in Pain? Yes    Pain Score 2  Pain Location Shoulder    Pain Orientation Right    Pain Descriptors / Indicators Sore;Aching    Pain Type Chronic pain    Pain Onset More than a month ago    Pain Frequency Constant    Aggravating Factors  See above.    Pain Relieving Factors See above.              [facility] PT Assessment - 01/28/21 0001      Assessment   Medical Diagnosis Impingement syndrome of right shoulder region.    Referring Provider (PT) Esmond Plants MD    Onset Date/Surgical Date --   "Few months ago."     Precautions   Precautions None      Restrictions   Weight Bearing Restrictions No      Balance Screen   Has the patient fallen in the past 6 months No    Has the patient  had a decrease in activity level because of a fear of falling?  Yes    Is the patient reluctant to leave their home because of a fear of falling?  No      Home Environment   Living Environment Private residence      Prior Function   Level of Independence Independent      Observation/Other Assessments   Observations Bruised in right post cuff region due to injection.    Focus on Therapeutic Outcomes (FOTO)  Complete.      Posture/Postural Control   Posture/Postural Control Postural limitations    Postural Limitations Rounded Shoulders;Forward head      Deep Tendon Reflexes   DTR Assessment Site Biceps;Brachioradialis;Triceps    Biceps DTR 2+    Brachioradialis DTR 2+    Triceps DTR 2+      ROM / Strength   AROM / PROM / Strength Strength;AROM      AROM   Overall AROM Comments Active right shoulder antigravity flexion to 160 degrees, ER is full and behind back to mid-back.      Strength   Overall Strength Comments Right shoulder ER is 4-/5.  IR is 5/5.  Deltoid strength is essentially normal.      Palpation   Palpation comment Tender to palpation in right mid-back region with increased tone.  Posterior cuff/acromial ridge discomfort as well.                      Objective measurements completed on examination: See above findings.       [facility] Adult PT Treatment/Exercise - 01/28/21 0001      Modalities   Modalities Electrical Stimulation      Electrical Stimulation   Electrical Stimulation Location RT mid scap and post cuff/lateral right shoulder.    Electrical Stimulation Action Pre-mod to both regions.    Electrical Stimulation Parameters 80-150 Hz x 20 minutes.    Electrical Stimulation Goals Pain                       PT Long Term Goals - 01/28/21 1051      PT LONG TERM GOAL #1   Title I with HEP    Time 4    Period Weeks    Status New      PT LONG TERM GOAL #2   Title Perform ADL's with right shoulder pain not > 2/10.     Period Weeks    Status New      PT LONG TERM GOAL #3  Title Right shoulder ER strength to 5/5.    Time 4    Period Weeks    Status New                  Plan - 01/28/21 1047    Clinical Impression Statement The patient presenst to [facility] with c/o right shoulder pain that has been ongoing for the last fewmonths.  A recent injection was quite helpful.  Her right shoulder active range of motion is nearly full.  She is weak into ER and tender to palpation in her right mid-scapular region and posterior cuff/acromial ridge region.  Patient will benefit from skilled physical therapy intervention to address pain and deficits.    Personal Factors and Comorbidities Comorbidity 1;Other    Comorbidities Right THA(3/19), left THA(8/21).    Examination-Activity Limitations Other;Reach Overhead    Examination-Participation Restrictions Other    Stability/Clinical Decision Making Stable/Uncomplicated    Clinical Decision Making Low    Rehab Potential Excellent    PT Frequency 3x / week    PT Duration 4 weeks    PT Treatment/Interventions ADLs/Self Care Home Management;Cryotherapy;Electrical Stimulation;Ultrasound;Moist Heat;Iontophoresis 4mg /ml Dexamethasone;Therapeutic activities;Therapeutic exercise;Manual techniques;Patient/family education;Passive range of motion;Dry needling;Joint Manipulations;Spinal Manipulations    PT Next Visit Plan RW4, [facility] ER, scapular exercises, wall climbs, pain-free pulleys, UE Ranger, PRE's.  STW/M and modalities as needed.    Consulted and Agree with Plan of Care Patient           Patient will benefit from skilled therapeutic intervention in order to improve the following deficits and impairments:  Pain,Decreased activity tolerance,Decreased strength  Visit Diagnosis: Chronic right shoulder pain - Plan: PT plan of care cert/re-cert  Muscle weakness (generalized) - Plan: PT plan of care cert/re-cert     Problem List Patient Active Problem List    Diagnosis Date Noted  . Primary osteoarthritis of left hip 07/03/2020  . Dupuytren's contracture of left hand 10/10/2019  . Vitamin D deficiency 05/20/2018  . OA (osteoarthritis) of hip 02/16/2018  . History of skin cancer 04/22/2016  . Hyperlipidemia 04/22/2016  . Popliteal artery occlusion, right ([place]) 11/29/2015  . Lumbar radiculopathy 11/20/2015  . Gluteal tendinitis of left buttock 11/14/2015  . Tensor fascia lata syndrome 10/08/2015  . Hemorrhoids   . Thrombosed external hemorrhoid 10/19/2014  . Anal fissure - posterior 01/29/2014  . CHEST PAIN 01/10/2009  . Osteopenia of left femoral neck 09/18/2008  . GERD 07/10/2008  . BENIGN POSITIONAL VERTIGO 01/18/2008    Harding Thomure, [place] MPT 01/28/2021, 10:53 AM  [facility] [address] [place], [place], [address] Phone: [phone]   Fax:  [phone]  Name: HIAWATHA DRESSEL MRN: [id number] Date of Birth: [birth year]

## 2021-01-30 ENCOUNTER — Other Ambulatory Visit: Payer: Self-pay

## 2021-01-30 ENCOUNTER — Ambulatory Visit: Payer: Medicare HMO | Admitting: Physical Therapy

## 2021-01-30 DIAGNOSIS — M6281 Muscle weakness (generalized): Secondary | ICD-10-CM

## 2021-01-30 DIAGNOSIS — G8929 Other chronic pain: Secondary | ICD-10-CM | POA: Diagnosis not present

## 2021-01-30 DIAGNOSIS — M25511 Pain in right shoulder: Secondary | ICD-10-CM | POA: Diagnosis not present

## 2021-01-30 NOTE — Patient Instructions (Signed)
  Strengthening: Resisted Flexion   Hold tubing with right arm at side. Pull forward and up. Move shoulder through pain-free range of motion. Repeat __10__ times per set. Do _2-3___ sets per session. Do _2-3___ sessions per day.  Strengthening: Resisted Extension   Hold tubing in right hand, arm forward. Pull arm back, elbow straight. Repeat __10__ times per set. Do _2-3___ sets per session. Do _2-3___ sessions per day.   Strengthening: Resisted Internal Rotation   Hold tubing in right hand, elbow at side and forearm out. Rotate forearm in across body. Repeat __10__ times per set. Do _2-3___ sets per session. Do _2-3___ sessions per day.   Strengthening: Resisted External Rotation   Hold tubing in right hand, elbow at side and forearm across body. Rotate forearm out. Repeat __10__ times per set. Do __2-3__ sets per session. Do ____ sessions per day.   

## 2021-01-30 NOTE — Therapy (Signed)
[facility] [place], [place], [address] Phone: [phone]   Fax:  [phone]  Physical Therapy Treatment  Patient Details  Name: Elizabeth Douglas MRN: [id number] Date of Birth: 04-27-1951 Referring Provider (PT): Esmond Plants MD   Encounter Date: 01/30/2021   PT End of Session - 01/30/21 0946    Visit Number 2    Number of Visits 12    Date for PT Re-Evaluation 02/25/21    Authorization Type FOTO AT LEAST EVERY 5TH VISIT.  PROGRESS NOTE AT 10TH VISIT.  KX MODIFIER AFTER 15 VISITS.    PT Start Time 0900    PT Stop Time 0951    PT Time Calculation (min) 51 min    Activity Tolerance Patient tolerated treatment well    Behavior During Therapy WFL for tasks assessed/performed           Past Medical History:  Diagnosis Date  . Anal fissure   . Arthritis    hip  . Atypical chest pain   . Basal cell carcinoma    Skin cancer face  . Complication of anesthesia    BP drops and passes out  [place] vomiting  . DVT (deep venous thrombosis) (HCC)    Right Leg  . Excess or deficiency of vitamin D   . Gallbladder polyp   . GERD (gastroesophageal reflux disease)   . Hemorrhoids   . Microhematuria    pt. denies  . Osteopenia   . Peripheral vascular disease ([place])   . PONV (postoperative nausea and vomiting)   . Thrombocytopenia ([place])     Past Surgical History:  Procedure Laterality Date  . ABDOMINAL AORTOGRAM W/LOWER EXTREMITY Right 12/22/2018   Procedure: ABDOMINAL AORTOGRAM W/LOWER EXTREMITY Runoff;  Surgeon: Waynetta Sandy, MD;  Location: [facility] [facility];  Service: Cardiovascular;  Laterality: Right;  . COLONOSCOPY  2005, 2015  . INTRAOPERATIVE ARTERIOGRAM Right 11/29/2015   Procedure: INTRA OPERATIVE ARTERIOGRAM;  Surgeon: Conrad Howard, MD;  Location: [place];  Service: Vascular;  Laterality: Right;  . JOINT REPLACEMENT     right total hip Aluisio 02-16-18  . LUNG BIOPSY    . THROMBECTOMY FEMORAL ARTERY  Right 11/29/2015   Procedure:  RIGHT  POPLITEAL to anterior tibial and posterior tibial ARTERY thrombectomy. Right above knee to below knee popliteal artery bypass;  Surgeon: Conrad Belknap, MD;  Location: [facility];  Service: Vascular;  Laterality: Right;  . TOTAL HIP ARTHROPLASTY Right 02/16/2018   Procedure: RIGHT TOTAL HIP ARTHROPLASTY ANTERIOR APPROACH;  Surgeon: Gaynelle Arabian, MD;  [facility];  Service: Orthopedics;  Laterality: Right;  . TOTAL HIP ARTHROPLASTY Left 07/03/2020   Procedure: TOTAL HIP ARTHROPLASTY ANTERIOR APPROACH;  Surgeon: Gaynelle Arabian, MD;  [facility]: [facility];  Service: Orthopedics;  Laterality: Left;  155min  . UPPER GASTROINTESTINAL ENDOSCOPY  2009    There were no vitals filed for this visit.   Subjective Assessment - 01/30/21 0903    Subjective COVID-19 screen performed prior to patient entering clinic. Patient arrived doing well with only pain with certain movements    Pertinent History Right THA(3/19), left THA(8/21).    Patient Stated Goals Use right UE without pain.    Currently in Pain? Yes    Pain Score 1     Pain Location Shoulder    Pain Orientation Right    Pain Descriptors / Indicators Sore    Pain Type Chronic pain    Pain Onset More than a month ago  Pain Frequency Intermittent    Aggravating Factors  with certain movements    Pain Relieving Factors at rest                             [facility] Adult PT Treatment/Exercise - 01/30/21 0001      Exercises   Exercises Shoulder      Shoulder Exercises: Prone   Retraction Strengthening;Right   2x15   Retraction Weight (lbs) 2    Extension Strengthening;Right;Weights   2x15   Extension Weight (lbs) 2      Shoulder Exercises: Sidelying   External Rotation Strengthening;Right;Weights;20 reps    External Rotation Weight (lbs) 2      Shoulder Exercises: Standing   Protraction Strengthening;Right;20 reps;Theraband    Theraband Level (Shoulder Protraction) Level 1 (Yellow)     External Rotation Strengthening;Right;20 reps;Theraband    Theraband Level (Shoulder External Rotation) Level 1 (Yellow)    Internal Rotation Strengthening;Right;20 reps;Theraband    Theraband Level (Shoulder Internal Rotation) Level 1 (Yellow)    ABduction Strengthening;Both;Theraband   shoulder height to down 2x10   Extension Strengthening;Both;20 reps;Theraband    Theraband Level (Shoulder Extension) Level 2 (Red)    Row Strengthening;Both;20 reps;Theraband    Theraband Level (Shoulder Row) Level 2 (Red)    Retraction Strengthening;Right;Theraband;20 reps    Theraband Level (Shoulder Retraction) Level 1 (Yellow)    Other Standing Exercises wall slides and circles x10each way    Other Standing Exercises flexion/scaption 1# 2x10 each      Shoulder Exercises: Pulleys   Flexion 3 minutes      Electrical Stimulation   Electrical Stimulation Location RT mid scap and post cuff/lateral right shoulder.    Electrical Stimulation Action IFC    Electrical Stimulation Parameters 80-150hz  x13min    Electrical Stimulation Goals Pain                  PT Education - 01/30/21 0930    Education Details RW4 yellow band    Person(s) Educated Patient    Methods Explanation;Demonstration;Handout    Comprehension Verbalized understanding;Returned demonstration               PT Long Term Goals - 01/28/21 1051      PT LONG TERM GOAL #1   Title I with HEP    Time 4    Period Weeks    Status New      PT LONG TERM GOAL #2   Title Perform ADL's with right shoulder pain not > 2/10.    Period Weeks    Status New      PT LONG TERM GOAL #3   Title Right shoulder ER strength to 5/5.    Time 4    Period Weeks    Status New                 Plan - 01/30/21 0949    Clinical Impression Statement Patient tolerated treatment well today. Patient able to progress with right shoulder PRE's with all exercises pain free. Patient has a good understanding of technique and pace. Today  issued HEP for home progression. Patient has overall minimal pain and increases with certain movements. Patient goals progressing today.    Personal Factors and Comorbidities Comorbidity 1;Other    Comorbidities Right THA(3/19), left THA(8/21).    Examination-Activity Limitations Other;Reach Overhead    Examination-Participation Restrictions Other    Stability/Clinical Decision Making Stable/Uncomplicated    Rehab Potential  Excellent    PT Frequency 3x / week    PT Duration 4 weeks    PT Treatment/Interventions ADLs/Self Care Home Management;Cryotherapy;Electrical Stimulation;Ultrasound;Moist Heat;Iontophoresis 4mg /ml Dexamethasone;Therapeutic activities;Therapeutic exercise;Manual techniques;Patient/family education;Passive range of motion;Dry needling;Joint Manipulations;Spinal Manipulations    PT Next Visit Plan cont with POC for scapular exercises/PRE's, and no overhead weights per MD  STW/M and modalities as needed.    Consulted and Agree with Plan of Care Patient           Patient will benefit from skilled therapeutic intervention in order to improve the following deficits and impairments:  Pain,Decreased activity tolerance,Decreased strength  Visit Diagnosis: Chronic right shoulder pain  Muscle weakness (generalized)     Problem List Patient Active Problem List   Diagnosis Date Noted  . Primary osteoarthritis of left hip 07/03/2020  . Dupuytren's contracture of left hand 10/10/2019  . Vitamin D deficiency 05/20/2018  . OA (osteoarthritis) of hip 02/16/2018  . History of skin cancer 04/22/2016  . Hyperlipidemia 04/22/2016  . Popliteal artery occlusion, right ([place]) 11/29/2015  . Lumbar radiculopathy 11/20/2015  . Gluteal tendinitis of left buttock 11/14/2015  . Tensor fascia lata syndrome 10/08/2015  . Hemorrhoids   . Thrombosed external hemorrhoid 10/19/2014  . Anal fissure - posterior 01/29/2014  . CHEST PAIN 01/10/2009  . Osteopenia of left femoral neck 09/18/2008   . GERD 07/10/2008  . BENIGN POSITIONAL VERTIGO 01/18/2008    Jahkari Maclin P, PTA 01/30/2021, 9:58 AM  [facility] [place], [place], [address] Phone: [phone]   Fax:  [phone]  Name: DULCEMARIA BULA MRN: [id number] Date of Birth: 1951/04/06

## 2021-02-03 ENCOUNTER — Ambulatory Visit: Payer: Medicare HMO | Admitting: Physical Therapy

## 2021-02-03 ENCOUNTER — Other Ambulatory Visit: Payer: Self-pay

## 2021-02-03 DIAGNOSIS — M6281 Muscle weakness (generalized): Secondary | ICD-10-CM | POA: Diagnosis not present

## 2021-02-03 DIAGNOSIS — M25511 Pain in right shoulder: Secondary | ICD-10-CM | POA: Diagnosis not present

## 2021-02-03 DIAGNOSIS — G8929 Other chronic pain: Secondary | ICD-10-CM

## 2021-02-03 NOTE — Therapy (Signed)
[facility] [place], [place], [address] Phone: [phone]   Fax:  [phone]  Physical Therapy Treatment  Patient Details  Name: Elizabeth Douglas MRN: [id number] Date of Birth: 08/01/1951 Referring Provider (PT): Esmond Plants MD   Encounter Date: 02/03/2021   PT End of Session - 02/03/21 0919    Visit Number 3    Number of Visits 12    Date for PT Re-Evaluation 02/25/21    Authorization Type FOTO AT LEAST EVERY 5TH VISIT.  PROGRESS NOTE AT 10TH VISIT.  KX MODIFIER AFTER 15 VISITS.    PT Start Time 0900    PT Stop Time [phone]    PT Time Calculation (min) 52 min    Activity Tolerance Patient tolerated treatment well    Behavior During Therapy WFL for tasks assessed/performed           Past Medical History:  Diagnosis Date  . Anal fissure   . Arthritis    hip  . Atypical chest pain   . Basal cell carcinoma    Skin cancer face  . Complication of anesthesia    BP drops and passes out  [place] vomiting  . DVT (deep venous thrombosis) (HCC)    Right Leg  . Excess or deficiency of vitamin D   . Gallbladder polyp   . GERD (gastroesophageal reflux disease)   . Hemorrhoids   . Microhematuria    pt. denies  . Osteopenia   . Peripheral vascular disease ([place])   . PONV (postoperative nausea and vomiting)   . Thrombocytopenia ([place])     Past Surgical History:  Procedure Laterality Date  . ABDOMINAL AORTOGRAM W/LOWER EXTREMITY Right 12/22/2018   Procedure: ABDOMINAL AORTOGRAM W/LOWER EXTREMITY Runoff;  Surgeon: Waynetta Sandy, MD;  Location: [facility];  Service: Cardiovascular;  Laterality: Right;  . COLONOSCOPY  2005, 2015  . INTRAOPERATIVE ARTERIOGRAM Right 11/29/2015   Procedure: INTRA OPERATIVE ARTERIOGRAM;  Surgeon: Conrad [place]. [place], MD;  Location: [facility];  Service: Vascular;  Laterality: Right;  . JOINT REPLACEMENT     right total hip Aluisio 02-16-18  . LUNG BIOPSY    . THROMBECTOMY FEMORAL ARTERY  Right 11/29/2015   Procedure:  RIGHT  POPLITEAL to anterior tibial and posterior tibial ARTERY thrombectomy. Right above knee to below knee popliteal artery bypass;  Surgeon: Conrad [place], MD;  Location: [facility];  Service: Vascular;  Laterality: Right;  . TOTAL HIP ARTHROPLASTY Right 02/16/2018   Procedure: RIGHT TOTAL HIP ARTHROPLASTY ANTERIOR APPROACH;  Surgeon: Gaynelle Arabian, MD;  Location: [facility];  Service: Orthopedics;  Laterality: Right;  . TOTAL HIP ARTHROPLASTY Left 07/03/2020   Procedure: TOTAL HIP ARTHROPLASTY ANTERIOR APPROACH;  Surgeon: Gaynelle Arabian, MD;  Location: [facility];  Service: Orthopedics;  Laterality: Left;  168min  . UPPER GASTROINTESTINAL ENDOSCOPY  2009    There were no vitals filed for this visit.   Subjective Assessment - 02/03/21 0900    Subjective COVID-19 screen performed prior to patient entering clinic. Patient reported with good report after last treatment. No pain unless certain movements with activity    Pertinent History Right THA(3/19), left THA(8/21).    Patient Stated Goals Use right UE without pain.    Pain Location Shoulder    Pain Orientation Right    Pain Descriptors / Indicators Discomfort    Pain Type Chronic pain    Pain Onset More than a month ago    Pain Frequency Intermittent    Aggravating  Factors  only with certain movements with activity    Pain Relieving Factors at rest                             [facility] Adult PT Treatment/Exercise - 02/03/21 0001      Shoulder Exercises: Supine   Protraction Strengthening;Right;20 reps;10 reps;Weights    Protraction Weight (lbs) 2    Flexion Strengthening;20 reps    Shoulder Flexion Weight (lbs) 2      Shoulder Exercises: Prone   Retraction Strengthening;Right    Retraction Weight (lbs) 2    Flexion Strengthening;Right;Left;Weights   3x10   Flexion Weight (lbs) 2    Extension Strengthening;Right;Weights    Extension Weight (lbs) 2    Horizontal ABduction 1  Strengthening;Right;Theraband   3x10   Horizontal ABduction 1 Weight (lbs) 2      Shoulder Exercises: Sidelying   External Rotation Strengthening;Right;Weights;20 reps;10 reps    External Rotation Weight (lbs) 2      Shoulder Exercises: Standing   Protraction Strengthening;Right;20 reps;Theraband;10 reps    Theraband Level (Shoulder Protraction) Level 2 (Red)    External Rotation Strengthening;Right;20 reps;Theraband;10 reps    Theraband Level (Shoulder External Rotation) Level 2 (Red)    Internal Rotation Strengthening;Right;20 reps;Theraband;10 reps    Theraband Level (Shoulder Internal Rotation) Level 2 (Red)    Extension Strengthening;Both;20 reps;Theraband;10 reps    Theraband Level (Shoulder Extension) Level 2 (Red)    Diagonals Strengthening;Right;Weights   2x10 each way   Other Standing Exercises bil abd with red band shoulder and below 3x10    Other Standing Exercises flexion/scaption/abd 1# 3x10 each to (90 degrees only)      Shoulder Exercises: Pulleys   Flexion 3 minutes      Shoulder Exercises: ROM/Strengthening   Wall Pushups Other (comment)   3x10   Other ROM/Strengthening Exercises small body blade small blade ER/IR 11min x 2 reps      Electrical Stimulation   Electrical Stimulation Location RT mid scap and post cuff/lateral right shoulder.    Electrical Stimulation Action IFC 80-150hz  x47min    Electrical Stimulation Goals Other (comment)   per request after exercises                      PT Long Term Goals - 02/03/21 0920      PT LONG TERM GOAL #1   Title I with HEP    Time 4    Period Weeks    Status On-going      PT LONG TERM GOAL #2   Title Perform ADL's with right shoulder pain not > 2/10.    Baseline able to perform ADL's with some soreness 02/03/21    Time 6    Period Weeks    Status On-going      PT LONG TERM GOAL #3   Title Right shoulder ER strength to 5/5.    Time 4    Period Weeks    Status On-going                  Plan - 02/03/21 0944    Clinical Impression Statement Patient tolerated treatment well today. Patient able to progress PRE's for right shoulder with increased reps. Patient reported minimal discomfort overall and fatigue post exercises. Patient tolerated progression well and all activities to shoulder height to avoid neck discomfort. Patient responded well post modalities today. Goals progressing today.    Personal Factors  and Comorbidities Comorbidity 1;Other    Comorbidities Right THA(3/19), left THA(8/21).    Examination-Activity Limitations Other;Reach Overhead    Examination-Participation Restrictions Other    Stability/Clinical Decision Making Stable/Uncomplicated    Rehab Potential Excellent    PT Frequency 3x / week    PT Duration 4 weeks    PT Treatment/Interventions ADLs/Self Care Home Management;Cryotherapy;Electrical Stimulation;Ultrasound;Moist Heat;Iontophoresis 4mg /ml Dexamethasone;Therapeutic activities;Therapeutic exercise;Manual techniques;Patient/family education;Passive range of motion;Dry needling;Joint Manipulations;Spinal Manipulations    PT Next Visit Plan cont with POC for scapular exercises/PRE's, and no overhead weights per MD  STW/M and modalities as needed.    Consulted and Agree with Plan of Care Patient           Patient will benefit from skilled therapeutic intervention in order to improve the following deficits and impairments:  Pain,Decreased activity tolerance,Decreased strength  Visit Diagnosis: Chronic right shoulder pain  Muscle weakness (generalized)     Problem List Patient Active Problem List   Diagnosis Date Noted  . Primary osteoarthritis of left hip 07/03/2020  . Dupuytren's contracture of left hand 10/10/2019  . Vitamin D deficiency 05/20/2018  . OA (osteoarthritis) of hip 02/16/2018  . History of skin cancer 04/22/2016  . Hyperlipidemia 04/22/2016  . Popliteal artery occlusion, right ([place]) 11/29/2015  . Lumbar radiculopathy  11/20/2015  . Gluteal tendinitis of left buttock 11/14/2015  . Tensor fascia lata syndrome 10/08/2015  . Hemorrhoids   . Thrombosed external hemorrhoid 10/19/2014  . Anal fissure - posterior 01/29/2014  . CHEST PAIN 01/10/2009  . Osteopenia of left femoral neck 09/18/2008  . GERD 07/10/2008  . BENIGN POSITIONAL VERTIGO 01/18/2008    Kerra Guilfoil P, PTA 02/03/2021, 9:55 AM  [facility] [place], [place], [address] Phone: [phone]   Fax:  [phone]  Name: Elizabeth Douglas MRN: [id number] Date of Birth: 08/27/1951

## 2021-02-06 ENCOUNTER — Other Ambulatory Visit: Payer: Self-pay

## 2021-02-06 ENCOUNTER — Ambulatory Visit: Payer: Medicare HMO | Admitting: Physical Therapy

## 2021-02-06 DIAGNOSIS — G8929 Other chronic pain: Secondary | ICD-10-CM | POA: Diagnosis not present

## 2021-02-06 DIAGNOSIS — M6281 Muscle weakness (generalized): Secondary | ICD-10-CM

## 2021-02-06 DIAGNOSIS — M25511 Pain in right shoulder: Secondary | ICD-10-CM

## 2021-02-06 NOTE — Therapy (Addendum)
[facility] [place], [place], [address] Phone: [phone]   Fax:  [phone]  Physical Therapy Treatment  Patient Details  Name: Elizabeth Douglas MRN: [id number] Date of Birth: [birth year] Referring Provider (PT): Esmond Plants MD   Encounter Date: 02/06/2021   PT End of Session - 02/06/21 0908     Visit Number 4    Number of Visits 12    Date for PT Re-Evaluation 02/25/21    Authorization Type FOTO 4th 26%.  PROGRESS NOTE AT 10TH VISIT.  KX MODIFIER AFTER 15 VISITS.    PT Start Time 0900    PT Stop Time 0950    PT Time Calculation (min) 50 min    Activity Tolerance Patient tolerated treatment well    Behavior During Therapy WFL for tasks assessed/performed             Past Medical History:  Diagnosis Date   Anal fissure    Arthritis    hip   Atypical chest pain    Basal cell carcinoma    Skin cancer face   Complication of anesthesia    BP drops and passes out  [place] vomiting   DVT (deep venous thrombosis) (HCC)    Right Leg   Excess or deficiency of vitamin D    Gallbladder polyp    GERD (gastroesophageal reflux disease)    Hemorrhoids    Microhematuria    pt. denies   Osteopenia    Peripheral vascular disease (HCC)    PONV (postoperative nausea and vomiting)    Thrombocytopenia ([place])     Past Surgical History:  Procedure Laterality Date   ABDOMINAL AORTOGRAM W/LOWER EXTREMITY Right 12/22/2018   Procedure: ABDOMINAL AORTOGRAM W/LOWER EXTREMITY Runoff;  Surgeon: Waynetta Sandy, MD;  Location: [facility];  Service: Cardiovascular;  Laterality: Right;   COLONOSCOPY  2005, 2015   INTRAOPERATIVE ARTERIOGRAM Right 11/29/2015   Procedure: INTRA OPERATIVE ARTERIOGRAM;  Surgeon: Conrad Annada, MD;  Location: [facility];  Service: Vascular;  Laterality: Right;   JOINT REPLACEMENT     right total hip Aluisio 02-16-18   LUNG BIOPSY     THROMBECTOMY FEMORAL ARTERY Right 11/29/2015   Procedure:   RIGHT  POPLITEAL to anterior tibial and posterior tibial ARTERY thrombectomy. Right above knee to below knee popliteal artery bypass;  Surgeon: Conrad [place], MD;  Location: [facility];  Service: Vascular;  Laterality: Right;   TOTAL HIP ARTHROPLASTY Right 02/16/2018   Procedure: RIGHT TOTAL HIP ARTHROPLASTY ANTERIOR APPROACH;  Surgeon: Gaynelle Arabian, MD;  [facility];  Service: Orthopedics;  Laterality: Right;   TOTAL HIP ARTHROPLASTY Left 07/03/2020   Procedure: TOTAL HIP ARTHROPLASTY ANTERIOR APPROACH;  Surgeon: Gaynelle Arabian, MD;  Location: [facility];  Service: Orthopedics;  Laterality: Left;  146mn   UPPER GASTROINTESTINAL ENDOSCOPY  2009    There were no vitals filed for this visit.   Subjective Assessment - 02/06/21 0904     Subjective COVID-19 screen performed prior to patient entering clinic. Patient reported no pain only soreness.    Pertinent History Right THA(3/19), left THA(8/21).    Patient Stated Goals Use right UE without pain.    Currently in Pain? No/denies    Pain Location Shoulder    Pain Orientation Right    Pain Descriptors / Indicators Sore    Pain Type Chronic pain    Pain Onset More than a month ago    Pain Frequency Rarely    Aggravating Factors  only soreness with activity                               [facility] Adult PT Treatment/Exercise - 02/06/21 0001       Shoulder Exercises: Prone   Retraction Strengthening;Right    Retraction Weight (lbs) 2    Flexion Strengthening;Right;Left;Weights    Flexion Weight (lbs) 2    Extension Strengthening;Right;Weights    Extension Weight (lbs) 2    Horizontal ABduction 1 Strengthening;Right;Theraband    Horizontal ABduction 1 Weight (lbs) 2      Shoulder Exercises: Sidelying   External Rotation Strengthening;Right;Weights;20 reps;10 reps    External Rotation Weight (lbs) 2      Shoulder Exercises: Standing   Protraction Strengthening;Right;20 reps;Theraband;10 reps    Theraband Level (Shoulder  Protraction) Level 2 (Red)    External Rotation Strengthening;Right;20 reps;Theraband;10 reps    Theraband Level (Shoulder External Rotation) Level 2 (Red)    Internal Rotation Strengthening;Right;20 reps;Theraband;10 reps    Theraband Level (Shoulder Internal Rotation) Level 2 (Red)    Extension Strengthening;Both;20 reps;Theraband;10 reps    Theraband Level (Shoulder Extension) Level 2 (Red)    Diagonals Strengthening;Right;20 reps;Theraband   yellow red band   Other Standing Exercises flexion/scaption/abd 2# 3x10 each to (90 degrees only)      Shoulder Exercises: Pulleys   Flexion 3 minutes      Shoulder Exercises: ROM/Strengthening   Wall Pushups 20 reps      Shoulder Exercises: Stretch   Corner Stretch 4 reps;20 seconds    Other Shoulder Stretches towel stretch x3      Electrical Stimulation   Electrical Stimulation Location RT mid scap and post cuff/lateral right shoulder.    Electrical Stimulation Action IFC 80-'150hz'  x88mn    EPrintmakerGoals Other (comment)                    PT Education - 02/06/21 [phone]    Education Details HEP progression    Person(s) Educated Patient    Methods Explanation;Demonstration;Handout    Comprehension Verbalized understanding;Returned demonstration                 PT Long Term Goals - 02/06/21 0943       PT LONG TERM GOAL #1   Title I with HEP    Baseline Met 02/06/21    Time 4    Period Weeks    Status Achieved      PT LONG TERM GOAL #2   Title Perform ADL's with right shoulder pain not > 2/10.    Baseline Met 02/06/21    Time 6    Period Weeks    Status Achieved      PT LONG TERM GOAL #3   Title Right shoulder ER strength to 5/5.    Baseline Met 02/06/21 5/5    Time 4    Period Weeks    Status Achieved                   Plan - 02/06/21 0944     Clinical Impression Statement Patient has improved with all activities. Patient has met all current goals. Patient able to perfrom all  ADL's with no pain and independent with HEP. strength 5/5 for ER.    Personal Factors and Comorbidities Comorbidity 1;Other    Comorbidities Right THA(3/19), left THA(8/21).    Examination-Activity Limitations Other;Reach Overhead    Examination-Participation Restrictions Other  Stability/Clinical Decision Making Stable/Uncomplicated    Rehab Potential Excellent    PT Frequency 3x / week    PT Duration 4 weeks    PT Treatment/Interventions ADLs/Self Care Home Management;Cryotherapy;Electrical Stimulation;Ultrasound;Moist Heat;Iontophoresis 77m/ml Dexamethasone;Therapeutic activities;Therapeutic exercise;Manual techniques;Patient/family education;Passive range of motion;Dry needling;Joint Manipulations;Spinal Manipulations    PT Next Visit Plan on hold pending DC    Consulted and Agree with Plan of Care Patient             Patient will benefit from skilled therapeutic intervention in order to improve the following deficits and impairments:  Pain,Decreased activity tolerance,Decreased strength  Visit Diagnosis: Chronic right shoulder pain  Muscle weakness (generalized)     Problem List Patient Active Problem List   Diagnosis Date Noted   Primary osteoarthritis of left hip 07/03/2020   Dupuytren's contracture of left hand 10/10/2019   Vitamin D deficiency 05/20/2018   OA (osteoarthritis) of hip 02/16/2018   History of skin cancer 04/22/2016   Hyperlipidemia 04/22/2016   [place]   Lumbar radiculopathy 11/20/2015   Gluteal tendinitis of left buttock 11/14/2015   Tensor fascia lata syndrome 10/08/2015   Hemorrhoids    Thrombosed external hemorrhoid 10/19/2014   Anal fissure - posterior 01/29/2014   CHEST PAIN 01/10/2009   Osteopenia of left femoral neck 09/18/2008   GERD 07/10/2008   BENIGN POSITIONAL VERTIGO 01/18/2008    Celeste Tavenner P, PTA 02/06/2021, 9:54 AM  [facility] [address] [address]Phone: [phone]  Fax:  [phone] Name: Elizabeth TURRIMRN: [id number] of Birth: 7July 21, 1952PHYSICAL THERAPY DISCHARGE SUMMARY  Visits from Start of Care: 4.  Current functional level related to goals / functional outcomes: See above.   Remaining deficits: All goals met.   Education / Equipment: HEP.   Patient agrees to discharge. Patient goals were met. Patient is being discharged due to meeting the stated rehab goals.    CMaliApplegate MPT

## 2021-02-06 NOTE — Patient Instructions (Addendum)
Shoulder Flexion  Begin standing with arms by your side. Slowly bring arms forward to shoulder height. Hold at the top and then slowly lower arms back to starting position. 1-3# 10-30 reps 1-2 day    PRONE SHOULDER EXTENSION  Lie face down with your arm dangling over the side of the table/bed. Next,  raise your arm back and towards the side of your body. Return to starting position and repeat. 1-3# 10-30reps 1-2 day   Shoulder Diagonal Reaching Strength  Keep one hand on your lap firmly holding band Raise other hand upward and towards the side with yellow or red band x10-30 reps 1-2 day    ELASTIC BAND BILATERAL HORIZONTAL ABDUCTION  Start by holding an elastic band in front of your chest with your elbows straight. Then, pull your arms apart and towards the side. Return to starting position and repeat. Yellow or red band 10-30 reps 1-2day    Shoulder external rotation  Lie on your side. Bend knees together. Support neck with a towel or block. Place a towel btw your ribs and elbow.  Start: Elbow at 90 degrees angle bent, on the towel. Begin to rotate arm externally using the muscles behind your shouder, surface of scapula. Start with no weight. As the movement gets easier, start using 1-3 lbs dumbbell. Repeat 10-20 reps and switch sides.

## 2021-02-07 ENCOUNTER — Ambulatory Visit (INDEPENDENT_AMBULATORY_CARE_PROVIDER_SITE_OTHER)
Admission: RE | Admit: 2021-02-07 | Discharge: 2021-02-07 | Disposition: A | Discharge status: Home / Self Care | Payer: Medicare HMO | Source: Ambulatory Visit | Attending: Vascular Surgery | Admitting: Vascular Surgery

## 2021-02-07 ENCOUNTER — Ambulatory Visit: Payer: Medicare HMO | Admitting: Vascular Surgery

## 2021-02-07 ENCOUNTER — Encounter: Payer: Self-pay | Admitting: Vascular Surgery

## 2021-02-07 ENCOUNTER — Ambulatory Visit (HOSPITAL_COMMUNITY)
Admission: RE | Admit: 2021-02-07 | Discharge: 2021-02-07 | Disposition: A | Discharge status: Home / Self Care | Payer: Medicare HMO | Source: Ambulatory Visit | Attending: Vascular Surgery | Admitting: Vascular Surgery

## 2021-02-07 VITALS — BP 118/77 | HR 62 | Temp 97.9°F | Resp 20 | Ht 67.0 in | Wt 146.8 lb

## 2021-02-07 DIAGNOSIS — I739 Peripheral vascular disease, unspecified: Secondary | ICD-10-CM | POA: Insufficient documentation

## 2021-02-07 DIAGNOSIS — I70201 Unspecified atherosclerosis of native arteries of extremities, right leg: Secondary | ICD-10-CM

## 2021-02-07 DIAGNOSIS — Z95828 Presence of other vascular implants and grafts: Secondary | ICD-10-CM | POA: Diagnosis not present

## 2021-02-07 NOTE — Progress Notes (Signed)
Patient ID: Elizabeth Douglas, female   DOB: Nov 11, 1951, 70 y.o.   MRN: [id number]  Reason for Consult: Follow-up   Referred by Marin Olp, MD  Subjective:     HPI:  Elizabeth Douglas is a 70 y.o. female history of right above-the-knee to below-knee popliteal artery bypass by Dr. Bridgett Larsson about 7 years ago.  She continues on aspirin therapy recently was on Xarelto after left anterior approach hip replacement.  Since that time she does have some right leg numbness on the lateral thigh that she thinks is related to her contralateral surgery.  She has no new pain below the knee.  She walks without limitation other than noted above.  No tissue loss or ulceration.  Past Medical History:  Diagnosis Date  . Anal fissure   . Arthritis    hip  . Atypical chest pain   . Basal cell carcinoma    Skin cancer face  . Complication of anesthesia    BP drops and passes out  [place] vomiting  . DVT (deep venous thrombosis) (HCC)    Right Leg  . Excess or deficiency of vitamin D   . Gallbladder polyp   . GERD (gastroesophageal reflux disease)   . Hemorrhoids   . Microhematuria    pt. denies  . Osteopenia   . Peripheral vascular disease ([place])   . PONV (postoperative nausea and vomiting)   . Thrombocytopenia ([place])    Family History  Problem Relation Age of Onset  . COPD Mother   . Osteoporosis Mother   . Prostate cancer Father   . Heart disease Brother 77       Before age 30  . Heart attack Brother 42       Massive  . Lymphoma Brother        non and hodgkins  . Coronary artery disease Brother   . Colon cancer Neg Hx   . Colon polyps Neg Hx   . Rectal cancer Neg Hx   . Stomach cancer Neg Hx    Past Surgical History:  Procedure Laterality Date  . ABDOMINAL AORTOGRAM W/LOWER EXTREMITY Right 12/22/2018   Procedure: ABDOMINAL AORTOGRAM W/LOWER EXTREMITY Runoff;  Surgeon: Waynetta Sandy, MD;  Location: [facility];  Service: Cardiovascular;  Laterality: Right;  .  COLONOSCOPY  2005, 2015  . INTRAOPERATIVE ARTERIOGRAM Right 11/29/2015   Procedure: INTRA OPERATIVE ARTERIOGRAM;  Surgeon: Conrad Mill [place], MD;  Location: [facility];  Service: Vascular;  Laterality: Right;  . JOINT REPLACEMENT     right total hip Aluisio 02-16-18  . LUNG BIOPSY    . THROMBECTOMY FEMORAL ARTERY Right 11/29/2015   Procedure:  RIGHT  POPLITEAL to anterior tibial and posterior tibial ARTERY thrombectomy. Right above knee to below knee popliteal artery bypass;  Surgeon: Conrad Driftwood, MD;  Location: [facility];  Service: Vascular;  Laterality: Right;  . TOTAL HIP ARTHROPLASTY Right 02/16/2018   Procedure: RIGHT TOTAL HIP ARTHROPLASTY ANTERIOR APPROACH;  Surgeon: Gaynelle Arabian, MD;  [facility];  Service: Orthopedics;  Laterality: Right;  . TOTAL HIP ARTHROPLASTY Left 07/03/2020   Procedure: TOTAL HIP ARTHROPLASTY ANTERIOR APPROACH;  Surgeon: Gaynelle Arabian, MD;  Location: [facility];  Service: Orthopedics;  Laterality: Left;  168min  . UPPER GASTROINTESTINAL ENDOSCOPY  2009    Short Social History:  Social History   Tobacco Use  . Smoking status: Never Smoker  . Smokeless tobacco: Never Used  Substance Use Topics  . Alcohol use: No  Alcohol/week: 0.0 standard drinks    Allergies  Allergen Reactions  . Fish Allergy Anaphylaxis and Swelling    Pt felt like throat was closing last time she ate   . Iodixanol Rash    Contrast Dye     Current Outpatient Medications  Medication Sig Dispense Refill  . acetaminophen (TYLENOL) 500 MG tablet Take 1,000 mg by mouth at bedtime as needed for moderate pain.    . Calcium Carbonate-Simethicone (MAALOX MAX) 1000-60 MG CHEW Chew 1 each by mouth daily as needed (heartburn).    . Carboxymethylcellul-Glycerin (LUBRICATING EYE DROPS OP) Place 1 drop into both eyes daily.    Marland Kitchen omeprazole (PRILOSEC) 20 MG capsule TAKE 1 CAPSULE BY MOUTH TWICE A DAY 90 capsule 3  . Wheat Dextrin (BENEFIBER) POWD Take 1 Dose by mouth daily.      No current  facility-administered medications for this visit.    Review of Systems  Constitutional:  Constitutional negative. HENT: HENT negative.  Eyes: Eyes negative.  Respiratory: Respiratory negative.  Cardiovascular: Cardiovascular negative.  GI: Gastrointestinal negative.  Musculoskeletal: Musculoskeletal negative.  Skin: Skin negative.  Neurological: Positive for numbness.  Hematologic: Hematologic/lymphatic negative.  Psychiatric: Psychiatric negative.        Objective:  Objective   Vitals:   02/07/21 1121  BP: 118/77  Pulse: 62  Resp: 20  Temp: 97.9 F (36.6 C)  SpO2: 98%  Weight: 146 lb 12.8 oz (66.6 kg)  Height: 5\' 7"  (1.702 m)   Body mass index is 22.99 kg/m.  Physical Exam HENT:     Head: Normocephalic.     Nose:     Comments: Wearing a mask Eyes:     Pupils: Pupils are equal, round, and reactive to light.  Cardiovascular:     Rate and Rhythm: Normal rate.     Pulses: Normal pulses.  Pulmonary:     Effort: Pulmonary effort is normal.  Abdominal:     General: Abdomen is flat.     Palpations: Abdomen is soft.  Musculoskeletal:        General: Normal range of motion.     Cervical back: Normal range of motion and neck supple.  Skin:    General: Skin is warm and dry.     Capillary Refill: Capillary refill takes less than 2 seconds.  Neurological:     General: No focal deficit present.     Mental Status: She is alert.  Psychiatric:        Mood and Affect: Mood normal.        Behavior: Behavior normal.        Thought Content: Thought content normal.        Judgment: Judgment normal.     Data: Right Graft #1:  +------------------+--------+--------+---------+--------+           PSV cm/sStenosisWaveform Comments  +------------------+--------+--------+---------+--------+  Inflow      49       biphasic       +------------------+--------+--------+---------+--------+  Prox Anastomosis 53       triphasic       +------------------+--------+--------+---------+--------+  Proximal Graft  86       biphasic       +------------------+--------+--------+---------+--------+  Mid Graft     38       triphasic      +------------------+--------+--------+---------+--------+  Distal Graft   34       triphasic      +------------------+--------+--------+---------+--------+  Distal Anastomosis71       triphasic      +------------------+--------+--------+---------+--------+  Outflow      52       biphasic       +------------------+--------+--------+---------+--------+        Summary:  Right: Patent AK to BK bypass graft with no evidence of stenosis. Dilated  area mid graft 1.35 x 1.36 cm, likely at valve cusp.      Assessment/Plan:     70 year old female with the above-noted bypass approximately 7 years ago.  Velocities remain diminished but these are stable she has previously undergone angiography which was noted to be normal.  Patient has previously been seen every 6 months we will have her come back in 1 year given that she is far out for the bypass she continues to have palpable pulses in the bypass remained stable by duplex.     Waynetta Sandy MD [facility]

## 2021-02-13 DIAGNOSIS — Z85828 Personal history of other malignant neoplasm of skin: Secondary | ICD-10-CM | POA: Diagnosis not present

## 2021-02-13 DIAGNOSIS — L820 Inflamed seborrheic keratosis: Secondary | ICD-10-CM | POA: Diagnosis not present

## 2021-02-13 DIAGNOSIS — Z808 Family history of malignant neoplasm of other organs or systems: Secondary | ICD-10-CM | POA: Diagnosis not present

## 2021-02-13 DIAGNOSIS — C44319 Basal cell carcinoma of skin of other parts of face: Secondary | ICD-10-CM | POA: Diagnosis not present

## 2021-02-13 DIAGNOSIS — D2262 Melanocytic nevi of left upper limb, including shoulder: Secondary | ICD-10-CM | POA: Diagnosis not present

## 2021-02-13 DIAGNOSIS — D225 Melanocytic nevi of trunk: Secondary | ICD-10-CM | POA: Diagnosis not present

## 2021-02-13 DIAGNOSIS — L814 Other melanin hyperpigmentation: Secondary | ICD-10-CM | POA: Diagnosis not present

## 2021-02-13 DIAGNOSIS — D2272 Melanocytic nevi of left lower limb, including hip: Secondary | ICD-10-CM | POA: Diagnosis not present

## 2021-02-13 DIAGNOSIS — D485 Neoplasm of uncertain behavior of skin: Secondary | ICD-10-CM | POA: Diagnosis not present

## 2021-02-13 DIAGNOSIS — L578 Other skin changes due to chronic exposure to nonionizing radiation: Secondary | ICD-10-CM | POA: Diagnosis not present

## 2021-02-25 DIAGNOSIS — Z0100 Encounter for examination of eyes and vision without abnormal findings: Secondary | ICD-10-CM | POA: Diagnosis not present

## 2021-02-27 DIAGNOSIS — K219 Gastro-esophageal reflux disease without esophagitis: Secondary | ICD-10-CM | POA: Diagnosis not present

## 2021-02-27 DIAGNOSIS — Z7982 Long term (current) use of aspirin: Secondary | ICD-10-CM | POA: Diagnosis not present

## 2021-02-27 DIAGNOSIS — Z85828 Personal history of other malignant neoplasm of skin: Secondary | ICD-10-CM | POA: Diagnosis not present

## 2021-03-07 DIAGNOSIS — C44219 Basal cell carcinoma of skin of left ear and external auricular canal: Secondary | ICD-10-CM | POA: Diagnosis not present

## 2021-04-15 DIAGNOSIS — Z01411 Encounter for gynecological examination (general) (routine) with abnormal findings: Secondary | ICD-10-CM | POA: Diagnosis not present

## 2021-04-15 DIAGNOSIS — Z6822 Body mass index (BMI) 22.0-22.9, adult: Secondary | ICD-10-CM | POA: Diagnosis not present

## 2021-04-15 DIAGNOSIS — Z01419 Encounter for gynecological examination (general) (routine) without abnormal findings: Secondary | ICD-10-CM | POA: Diagnosis not present

## 2021-04-15 DIAGNOSIS — N952 Postmenopausal atrophic vaginitis: Secondary | ICD-10-CM | POA: Diagnosis not present

## 2021-04-15 DIAGNOSIS — Z124 Encounter for screening for malignant neoplasm of cervix: Secondary | ICD-10-CM | POA: Diagnosis not present

## 2021-04-23 DIAGNOSIS — Z1231 Encounter for screening mammogram for malignant neoplasm of breast: Secondary | ICD-10-CM | POA: Diagnosis not present

## 2021-04-23 LAB — HM MAMMOGRAPHY

## 2021-04-24 ENCOUNTER — Encounter: Payer: Self-pay | Admitting: Family Medicine

## 2021-04-25 ENCOUNTER — Ambulatory Visit: Payer: Medicare HMO | Admitting: Family Medicine

## 2021-04-28 ENCOUNTER — Ambulatory Visit: Payer: Medicare HMO | Admitting: Family Medicine

## 2021-04-29 ENCOUNTER — Other Ambulatory Visit: Payer: Self-pay

## 2021-04-29 ENCOUNTER — Ambulatory Visit (INDEPENDENT_AMBULATORY_CARE_PROVIDER_SITE_OTHER): Payer: Medicare HMO | Admitting: Family Medicine

## 2021-04-29 ENCOUNTER — Encounter: Payer: Self-pay | Admitting: Family Medicine

## 2021-04-29 VITALS — BP 104/66 | HR 68 | Temp 98.1°F | Ht 67.0 in | Wt 140.2 lb

## 2021-04-29 DIAGNOSIS — L750 Bromhidrosis: Secondary | ICD-10-CM

## 2021-04-29 DIAGNOSIS — Z0000 Encounter for general adult medical examination without abnormal findings: Secondary | ICD-10-CM | POA: Diagnosis not present

## 2021-04-29 DIAGNOSIS — E785 Hyperlipidemia, unspecified: Secondary | ICD-10-CM | POA: Diagnosis not present

## 2021-04-29 DIAGNOSIS — I70201 Unspecified atherosclerosis of native arteries of extremities, right leg: Secondary | ICD-10-CM | POA: Diagnosis not present

## 2021-04-29 DIAGNOSIS — M85852 Other specified disorders of bone density and structure, left thigh: Secondary | ICD-10-CM | POA: Diagnosis not present

## 2021-04-29 DIAGNOSIS — E559 Vitamin D deficiency, unspecified: Secondary | ICD-10-CM

## 2021-04-29 DIAGNOSIS — Z23 Encounter for immunization: Secondary | ICD-10-CM

## 2021-04-29 LAB — COMPREHENSIVE METABOLIC PANEL
ALT: 8 U/L (ref 0–35)
AST: 17 U/L (ref 0–37)
Albumin: 4.2 g/dL (ref 3.5–5.2)
Alkaline Phosphatase: 40 U/L (ref 39–117)
BUN: 10 mg/dL (ref 6–23)
CO2: 28 mEq/L (ref 19–32)
Calcium: 9.4 mg/dL (ref 8.4–10.5)
Chloride: 106 mEq/L (ref 96–112)
Creatinine, Ser: 0.63 mg/dL (ref 0.40–1.20)
GFR: 91.49 mL/min (ref >60.00)
Glucose, Bld: 80 mg/dL (ref 70–99)
Potassium: 4.2 mEq/L (ref 3.5–5.1)
Sodium: 141 mEq/L (ref 135–145)
Total Bilirubin: 0.5 mg/dL (ref 0.2–1.2)
Total Protein: 6.5 g/dL (ref 6.0–8.3)

## 2021-04-29 LAB — CBC WITH DIFFERENTIAL/PLATELET
Basophils Absolute: 0 10*3/uL (ref 0.0–0.1)
Basophils Relative: 0.2 % (ref 0.0–3.0)
Eosinophils Absolute: 0.1 10*3/uL (ref 0.0–0.7)
Eosinophils Relative: 1.6 % (ref 0.0–5.0)
HCT: 40.7 % (ref 36.0–46.0)
Hemoglobin: 14 g/dL (ref 12.0–15.0)
Lymphocytes Relative: 35.9 % (ref 12.0–46.0)
Lymphs Abs: 1.6 10*3/uL (ref 0.7–4.0)
MCHC: 34.5 g/dL (ref 30.0–36.0)
MCV: 95.3 fl (ref 78.0–100.0)
Monocytes Absolute: 0.2 10*3/uL (ref 0.1–1.0)
Monocytes Relative: 5.4 % (ref 3.0–12.0)
Neutro Abs: 2.6 10*3/uL (ref 1.4–7.7)
Neutrophils Relative %: 56.9 % (ref 43.0–77.0)
Platelets: 176 10*3/uL (ref 150.0–400.0)
RBC: 4.27 Mil/uL (ref 3.87–5.11)
RDW: 13.2 % (ref 11.5–15.5)
WBC: 4.5 10*3/uL (ref 4.0–10.5)

## 2021-04-29 LAB — LIPID PANEL
Cholesterol: 177 mg/dL (ref 0–200)
HDL: 56.2 mg/dL (ref >39.00)
LDL Cholesterol: 108 mg/dL — ABNORMAL HIGH (ref 0–99)
NonHDL: 120.4
Total CHOL/HDL Ratio: 3
Triglycerides: 63 mg/dL (ref 0.0–149.0)
VLDL: 12.6 mg/dL (ref 0.0–40.0)

## 2021-04-29 LAB — VITAMIN D 25 HYDROXY (VIT D DEFICIENCY, FRACTURES): VITD: 39.78 ng/mL (ref 30.00–100.00)

## 2021-04-29 LAB — POC URINALSYSI DIPSTICK (AUTOMATED)
Bilirubin, UA: NEGATIVE
Blood, UA: NEGATIVE
Glucose, UA: NEGATIVE
Ketones, UA: NEGATIVE
Leukocytes, UA: NEGATIVE
Nitrite, UA: NEGATIVE
Protein, UA: NEGATIVE
Spec Grav, UA: 1.015 (ref 1.010–1.025)
Urobilinogen, UA: 0.2 E.U./dL
pH, UA: 6 (ref 5.0–8.0)

## 2021-04-29 NOTE — Patient Instructions (Addendum)
Prevnar 20 today  Team please give calcium in foods handout- goal 1200mg  a day  Please stop by lab before you go If you have mychart- we will send your results within 3 business days of [place] receiving them.  If you do not have mychart- we will call you about results within 5 business days of [place] receiving them.  *please also note that you will see labs on mychart as soon as they post. I will later go in and write notes on them- will say "notes from Dr. Yong Channel"  We will call you within two weeks about your referral to coronary calcium scoring. If you do not hear within 2 weeks, give [place] a call.   You are eligible to schedule your annual wellness visit with our nurse specialist Otila Kluver.  Please consider scheduling this before you leave today  Recommended follow up: Return in about 1 year (around 04/29/2022) for physical or sooner if needed.

## 2021-04-29 NOTE — Progress Notes (Signed)
Phone [phone]   Subjective:  Patient presents today for their annual physical. Chief complaint-noted.   See problem oriented charting- ROS- full  review of systems was completed and negative except for: seasonal allergies  The following were reviewed and entered/updated in epic: Past Medical History:  Diagnosis Date  . Anal fissure   . Arthritis    hip  . Atypical chest pain   . Basal cell carcinoma    Skin cancer face  . Complication of anesthesia    BP drops and passes out  [place] vomiting  . DVT (deep venous thrombosis) (HCC)    Right Leg  . Excess or deficiency of vitamin D   . Gallbladder polyp   . GERD (gastroesophageal reflux disease)   . Hemorrhoids   . Microhematuria    pt. denies  . Osteopenia   . Peripheral vascular disease ([place])   . PONV (postoperative nausea and vomiting)   . Thrombocytopenia [facility])    Patient Active Problem List   Diagnosis Date Noted  . Popliteal artery occlusion, right ([place]) 11/29/2015    Priority: High  . Vitamin D deficiency 05/20/2018    Priority: Medium  . History of skin cancer 04/22/2016    Priority: Medium  . Hyperlipidemia 04/22/2016    Priority: Medium  . Osteopenia of left femoral neck 09/18/2008    Priority: Medium  . GERD 07/10/2008    Priority: Medium  . Dupuytren's contracture of left hand 10/10/2019    Priority: Low  . OA (osteoarthritis) of hip 02/16/2018    Priority: Low  . Lumbar radiculopathy 11/20/2015    Priority: Low  . Gluteal tendinitis of left buttock 11/14/2015    Priority: Low  . Tensor fascia lata syndrome 10/08/2015    Priority: Low  . Hemorrhoids     Priority: Low  . Thrombosed external hemorrhoid 10/19/2014    Priority: Low  . Anal fissure - posterior 01/29/2014    Priority: Low  . CHEST PAIN 01/10/2009    Priority: Low  . BENIGN POSITIONAL VERTIGO 01/18/2008    Priority: Low  . Primary osteoarthritis of left hip 07/03/2020   Past Surgical History:  Procedure Laterality Date  .  ABDOMINAL AORTOGRAM W/LOWER EXTREMITY Right 12/22/2018   Procedure: ABDOMINAL AORTOGRAM W/LOWER EXTREMITY Runoff;  Surgeon: Waynetta Sandy, MD;  Location: [place];  Service: Cardiovascular;  Laterality: Right;  . COLONOSCOPY  2005, 2015  . INTRAOPERATIVE ARTERIOGRAM Right 11/29/2015   Procedure: INTRA OPERATIVE ARTERIOGRAM;  Surgeon: Conrad Riley, MD;  Location: [place];  Service: Vascular;  Laterality: Right;  . JOINT REPLACEMENT     right total hip Aluisio 02-16-18  . LUNG BIOPSY    . THROMBECTOMY FEMORAL ARTERY Right 11/29/2015   Procedure:  RIGHT  POPLITEAL to anterior tibial and posterior tibial ARTERY thrombectomy. Right above knee to below knee popliteal artery bypass;  Surgeon: Conrad Hume, MD;  Location: [place];  Service: Vascular;  Laterality: Right;  . TOTAL HIP ARTHROPLASTY Right 02/16/2018   Procedure: RIGHT TOTAL HIP ARTHROPLASTY ANTERIOR APPROACH;  Surgeon: Gaynelle Arabian, MD;  [facility];  Service: Orthopedics;  Laterality: Right;  . TOTAL HIP ARTHROPLASTY Left 07/03/2020   Procedure: TOTAL HIP ARTHROPLASTY ANTERIOR APPROACH;  Surgeon: Gaynelle Arabian, MD;  [facility];  Service: Orthopedics;  Laterality: Left;  153min  . UPPER GASTROINTESTINAL ENDOSCOPY  2009    Family History  Problem Relation Age of Onset  . COPD Mother   . Osteoporosis Mother   .  Prostate cancer Father   . Heart disease Brother 63       Before age 45  . Heart attack Brother 42       Massive  . Lymphoma Brother        non and hodgkins  . Coronary artery disease Brother   . Colon cancer Neg Hx   . Colon polyps Neg Hx   . Rectal cancer Neg Hx   . Stomach cancer Neg Hx     Medications- reviewed and updated Current Outpatient Medications  Medication Sig Dispense Refill  . acetaminophen (TYLENOL) 500 MG tablet Take 1,000 mg by mouth at bedtime as needed for moderate pain.    Marland Kitchen aspirin EC 81 MG tablet Take 81 mg by mouth daily. Swallow whole.    . Calcium  Carbonate-Simethicone (MAALOX MAX) 1000-60 MG CHEW Chew 1 each by mouth daily as needed (heartburn).    . Carboxymethylcellul-Glycerin (LUBRICATING EYE DROPS OP) Place 1 drop into both eyes daily.    . Cholecalciferol (VITAMIN D) 50 MCG (2000 UT) tablet Take 2,000 Units by mouth daily.    Marland Kitchen omeprazole (PRILOSEC) 20 MG capsule TAKE 1 CAPSULE BY MOUTH TWICE A DAY 90 capsule 3  . Wheat Dextrin (BENEFIBER) POWD Take 1 Dose by mouth daily.      No current facility-administered medications for this visit.    Allergies-reviewed and updated Allergies  Allergen Reactions  . Fish Allergy Anaphylaxis and Swelling    Pt felt like throat was closing last time she ate   . Iodixanol Rash    Contrast Dye     Social History   Social History Narrative   Married. 1 daughter. No grandkids. 1 granddog and 1 grandcat.       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       Hobbies: walking 2.5 miles  [place], stays active   Objective  Objective:  BP 104/66   Pulse 68   Temp 98.1 F (36.7 C)   Ht 5\' 7"  (1.702 m)   Wt 140 lb 4 oz (63.6 kg)   SpO2 98%   BMI 21.97 kg/m  Gen: NAD, resting comfortably HEENT: Mucous membranes are moist. Oropharynx normal, TMs normal, nasal turbinates mildly swollen Neck: no thyromegaly, no carotid bruits, no cervical lympadenopathy CV: RRR no murmurs rubs or gallops Lungs: CTAB no crackles, wheeze, rhonchi Abdomen: soft/nontender/nondistended/normal bowel sounds. No rebound or guarding.  Ext: no edema, intact DP/PT pulses Skin: warm, dry Neuro: grossly normal, moves all extremities, PERRLA   Assessment and Plan   70 y.o. female presenting for annual physical.  Health Maintenance counseling: 1. Anticipatory guidance: Patient counseled regarding regular dental exams -q6 months, eye exams-yearly,  avoiding smoking and second hand smoke, limiting alcohol to 1 beverage per day- doesn't drink.   2. Risk factor  reduction:  Advised patient of need for regular exercise and diet rich and fruits and vegetables to reduce risk of heart attack and stroke. Exercise- walking around 2.5 miles daily and some home exercises. Diet- maintained weight during pandemic and has lost a few pounds since that time-recommended no further weight loss- she would like to try to lose one more pound .  Wt Readings from Last 3 Encounters:  04/29/21 140 lb 4 oz (63.6 kg)  02/07/21 146 lb 12.8 oz (66.6 kg)  11/27/20 150 lb 9.6 oz (68.3 kg)  3. Immunizations/screenings/ancillary studies-new recommendations for Prevnar 20 given today-patient opts for this and  Shingrix -had light case in Dec 2021- will postpone until next visit. -Also discussed option for fourth COVID vaccination- She wants to wait closer to Fall 2022 Immunization History  Administered Date(s) Administered  . Influenza Whole 09/19/2007  . Influenza, High Dose Seasonal PF 09/09/2018, 08/22/2019  . Influenza,inj,Quad PF,6+ Mos 09/26/2013, 08/30/2014, 09/04/2015, 09/13/2017  . Influenza-Unspecified 09/22/2017, 09/09/2018, 08/22/2019  . Moderna SARS-COV2 Booster Vaccination 10/30/2020  . Moderna Sars-Covid-2 Vaccination 01/03/2020, 02/01/2020  . PFIZER(Purple Top)SARS-COV-2 Vaccination 10/30/2020  . Td 05/12/2004  . Tdap 04/22/2016  . Zoster, Live 11/14/2014  4. Cervical cancer screening- follows Dr. Rhea Pink age based screening recommendations 5. Breast cancer screening-  breast exam with Dr. Benjie Karvonen of GYN and mammogram last 04/23/2021 with 1 year repeat 6. Colon cancer screening - 01/08/14 with 10-year follow-up 7. Skin cancer screening-  Follows yearly with dermatology Dr. Delman Cheadle with history of both basal and squamous cell carcinoma. advised regular sunscreen use. Denies worrisome, changing, or new skin lesions.  8. Birth control/STD check- postmenopausal and monogomous 9. Osteoporosis screening at 65-see discussion below - never smoker.  Status of chronic or acute  concerns   #Medical Updates: Shingles Mild in December 2021, Bilateral hip replacement- left hip on 9/21, received a steroid injection on shoulder, surgical excision on mole on ear.  #Popliteal artery occlusion, right- has close follows with Dr. Donzetta Matters of vascular surgery- updated imaging March 2022.  Imaging has been reassuring we will give #hyperlipidemia S: Medication:No medications with ASCVD risk typically under 5%. On aspirin 81 mg due to history of popliteal artery occlusion Lab Results  Component Value Date   CHOL 192 04/25/2020   HDL 55.80 04/25/2020   LDLCALC 121 (H) 04/25/2020   TRIG 74.0 04/25/2020   CHOLHDL 3 04/25/2020  Patient with history of bypass surgery and had catheterization not requiring intervention as recently as December 22, 2018. Most recent imaging has showed some diminished velocities but comparable to prior-patient continues ABIs/duplex with plan to remain on aspirin-they have not recommended statin- no claudication confirmed today A/P: Popliteal artery stenosis has been stable-continue aspirin alone.  Statin has not been specifically recommended by vascular surgery- stenosis thought more due to anatomy and atherosclerosis  For hyperlipidemia we are considering statin if has elevated coronary calcium scoring-he opts in for this test today  #Vitamin D deficiency S: Medication: 2000 units daily A/P: hopefully stable- update today. Continue current Vitamin D medication.  # Low Bone density (formerly osteopenia) S: Last DEXA: 05/16/2020 bone density is actually improved with worst T score -1.9 and lumbar spine Improved on last check- discussed calcium rich foods  Calcium: 1200mg  (through diet ok) recommended  At least Vitamin D: 1000 units a day recommended-she continues on 2000 units  A/P: Improved on last check-continue vitamin D, discussed calcium rich foods   #Family history of senile Purpura- she is concerned due to her mother bruising very easily- will  occur eventually but is not to be worried after reassurance today  #Urinary odor-patient has noted odor to her urine for approximately a month.  With underlying hyperlipidemia we opted to Check urine with labs today  #History of thrombocytopenia- mild intermittent issues-has seen hematology in the past with no further work-up recommended.  On last check was reassuring after surgery  #hx bilateral hip replacement- most recent sept 2021 on the left.  She reports healing well from this  Recommended follow up: Return in about 1 year (around 04/29/2022) for physical or sooner if needed. Future Appointments  Date Time Provider Department  Center  05/01/2021  9:30 AM [facility]   Lab/Order associations: I believe fasting   ICD-10-CM   1. Preventative health care  Z00.00 VITAMIN D 25 Hydroxy (Vit-D Deficiency, Fractures)    CBC with Differential/Platelet    Comprehensive metabolic panel    Lipid panel  2. Popliteal artery occlusion, right (HCC)  I70.201   3. Vitamin D deficiency  E55.9 VITAMIN D 25 Hydroxy (Vit-D Deficiency, Fractures)  4. Osteopenia of left femoral neck  M85.852   5. Hyperlipidemia, unspecified hyperlipidemia type  E78.5 CBC with Differential/Platelet    Comprehensive metabolic panel    Lipid panel    CT CARDIAC SCORING (SELF PAY ONLY)    POCT Urinalysis Dipstick (Automated)  6. Urinary body odor  L75.0 POCT Urinalysis Dipstick (Automated)  7. Need for pneumococcal vaccine  Z23 Pneumococcal conjugate vaccine 20-valent (Prevnar 20)    No orders of the defined types were placed in this encounter.  I,Harris Phan,acting as a Education administrator for Garret Reddish, MD.,have documented all relevant documentation on the behalf of Garret Reddish, MD,as directed by  Garret Reddish, MD while in the presence of Garret Reddish, MD.   I, Garret Reddish, MD, have reviewed all documentation for this visit. The documentation on 04/29/21 for the exam, diagnosis, procedures, and  orders are all accurate and complete.   Return precautions advised.  Garret Reddish, MD

## 2021-05-01 ENCOUNTER — Ambulatory Visit (INDEPENDENT_AMBULATORY_CARE_PROVIDER_SITE_OTHER): Payer: Medicare HMO

## 2021-05-01 DIAGNOSIS — Z0000 Encounter for general adult medical examination without abnormal findings: Secondary | ICD-10-CM

## 2021-05-01 NOTE — Progress Notes (Signed)
Virtual Visit via Telephone Note  I connected with  [name] on 05/01/21 at  9:30 AM EDT by telephone and verified that I am speaking with the correct person using two identifiers.  Medicare Annual Wellness visit completed telephonically due to Covid-19 pandemic.   Persons participating in this call: This Health Coach and this patient.   Location: Patient: Home Provider: Office   I discussed the limitations, risks, security and privacy concerns of performing an evaluation and management service by telephone and the availability of in person appointments. The patient expressed understanding and agreed to proceed.  Unable to perform video visit due to video visit attempted and failed and/or patient does not have video capability.   Some vital signs may be absent or patient reported.   Willette Brace, LPN    Subjective:   ZHAVIA CUNANAN is a 70 y.o. female who presents for Medicare Annual (Subsequent) preventive examination.  Review of Systems     Cardiac Risk Factors include: advanced age (>54men, >37 women);dyslipidemia     Objective:    There were no vitals filed for this visit. There is no height or weight on file to calculate BMI.  Advanced Directives 05/01/2021 01/28/2021 07/03/2020 07/03/2020 06/24/2020 12/29/2019 06/30/2019  Does Patient Have a Medical Advance Directive? No Yes No No No Yes No  Type of Advance Directive - - - - - Press photographer -  Does patient want to make changes to medical advance directive? - - - - - No - Patient declined -  Copy of [place] in Chart? - - - - - - -  Would patient like information on creating a medical advance directive? No - Patient declined - No - Patient declined No - Patient declined No - Patient declined - No - Patient declined    Current Medications (verified) Outpatient Encounter Medications as of 05/01/2021  Medication Sig  . acetaminophen (TYLENOL) 500 MG tablet Take 1,000 mg by mouth at  bedtime as needed for moderate pain.  Marland Kitchen aspirin EC 81 MG tablet Take 81 mg by mouth daily. Swallow whole.  . Calcium Carbonate-Simethicone (MAALOX MAX) 1000-60 MG CHEW Chew 1 each by mouth daily as needed (heartburn).  . Carboxymethylcellul-Glycerin (LUBRICATING EYE DROPS OP) Place 1 drop into both eyes daily.  . Cholecalciferol (VITAMIN D) 50 MCG (2000 UT) tablet Take 2,000 Units by mouth daily.  Marland Kitchen omeprazole (PRILOSEC) 20 MG capsule TAKE 1 CAPSULE BY MOUTH TWICE A DAY  . Wheat Dextrin (BENEFIBER) POWD Take 1 Dose by mouth daily.    No facility-administered encounter medications on file as of 05/01/2021.    Allergies (verified) Fish allergy and Iodixanol   History: Past Medical History:  Diagnosis Date  . Anal fissure   . Arthritis    hip  . Atypical chest pain   . Basal cell carcinoma    Skin cancer face  . Complication of anesthesia    BP drops and passes out  [place] vomiting  . DVT (deep venous thrombosis) (HCC)    Right Leg  . Excess or deficiency of vitamin D   . Gallbladder polyp   . GERD (gastroesophageal reflux disease)   . Hemorrhoids   . Microhematuria    pt. denies  . Osteopenia   . Peripheral vascular disease ([place])   . PONV (postoperative nausea and vomiting)   . Thrombocytopenia ([place])    Past Surgical History:  Procedure Laterality Date  . ABDOMINAL AORTOGRAM W/LOWER EXTREMITY Right 12/22/2018  Procedure: ABDOMINAL AORTOGRAM W/LOWER EXTREMITY [place];  Surgeon: Waynetta Sandy, MD;  Location: [facility];  Service: Cardiovascular;  Laterality: Right;  . COLONOSCOPY  2005, 2015  . INTRAOPERATIVE ARTERIOGRAM Right 11/29/2015   Procedure: INTRA OPERATIVE ARTERIOGRAM;  Surgeon: Conrad Larimer, MD;  Location: [facility];  Service: Vascular;  Laterality: Right;  . JOINT REPLACEMENT     right total hip [place] 02-16-18  . LUNG BIOPSY    . THROMBECTOMY FEMORAL ARTERY Right 11/29/2015   Procedure:  RIGHT  POPLITEAL to anterior tibial and posterior tibial  ARTERY thrombectomy. Right above knee to below knee popliteal artery bypass;  Surgeon: Conrad Fruithurst, MD;  Location: [facility];  Service: Vascular;  Laterality: Right;  . TOTAL HIP ARTHROPLASTY Right 02/16/2018   Procedure: RIGHT TOTAL HIP ARTHROPLASTY ANTERIOR APPROACH;  Surgeon: Gaynelle Arabian, MD;  Location: [facility];  Service: Orthopedics;  Laterality: Right;  . TOTAL HIP ARTHROPLASTY Left 07/03/2020   Procedure: TOTAL HIP ARTHROPLASTY ANTERIOR APPROACH;  Surgeon: Gaynelle Arabian, MD;  Location: [facility];  Service: Orthopedics;  Laterality: Left;  184min  . UPPER GASTROINTESTINAL ENDOSCOPY  2009   Family History  Problem Relation Age of Onset  . COPD Mother   . Osteoporosis Mother   . Prostate cancer Father   . Heart disease Brother 49       Before age 32  . Heart attack Brother 42       Massive  . Lymphoma Brother        non and hodgkins  . Coronary artery disease Brother   . Colon cancer Neg Hx   . Colon polyps Neg Hx   . Rectal cancer Neg Hx   . Stomach cancer Neg Hx    Social History   Socioeconomic History  . Marital status: Married    Spouse name: Not on file  . Number of children: 1  . Years of education: Not on file  . Highest education level: Not on file  Occupational History  . Occupation: Retired  Tobacco Use  . Smoking status: Never Smoker  . Smokeless tobacco: Never Used  Vaping Use  . Vaping Use: Never used  Substance and Sexual Activity  . Alcohol use: No    Alcohol/week: 0.0 standard drinks  . Drug use: No  . Sexual activity: Yes    Birth control/protection: Post-menopausal  Other Topics Concern  . Not on file  Social History Narrative   Married. 1 daughter. No grandkids. 1 granddog and 1 grandcat.       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       Hobbies: walking 2.5 miles  [place], stays active   Social Determinants of Health   Financial Resource Strain: Low Risk   . Difficulty of Paying Living  Expenses: Not hard at all  Food Insecurity: No Food Insecurity  . Worried About Charity fundraiser in the Last Year: Never true  . Ran Out of Food in the Last Year: Never true  Transportation Needs: No Transportation Needs  . Lack of Transportation (Medical): No  . Lack of Transportation (Non-Medical): No  Physical Activity: Sufficiently Active  . Days of Exercise per Week: 5 days  . Minutes of Exercise per Session: 60 min  Stress: No Stress Concern Present  . Feeling of Stress : Not at all  Social Connections: Moderately Integrated  . Frequency of Communication with Friends and Family: More than three times  a week  . Frequency of Social Gatherings with Friends and Family: More than three times a week  . Attends Religious Services: More than 4 times per year  . Active Member of Clubs or Organizations: No  . Attends Archivist Meetings: Never  . Marital Status: Married    Tobacco Counseling Counseling given: Not Answered   Clinical Intake:  Pre-visit preparation completed: Yes  Pain : No/denies pain     BMI - recorded: 21.97 Nutritional Status: BMI of 19-24  Normal Nutritional Risks: None Diabetes: No  How often do you need to have someone help you when you read instructions, pamphlets, or other written materials from your doctor or pharmacy?: 1 - Never  Diabetic?No  Interpreter Needed?: No  Information entered by :: Charlott Rakes, LPN   Activities of Daily Living In your present state of health, do you have any difficulty performing the following activities: 05/01/2021 07/03/2020  Hearing? N N  Vision? N N  Difficulty concentrating or making decisions? N N  Walking or climbing stairs? N N  Dressing or bathing? N N  Doing errands, shopping? N N  Preparing Food and eating ? N -  Using the Toilet? N -  In the past six months, have you accidently leaked urine? N -  Do you have problems with loss of bowel control? N -  Managing your Medications? N -   Managing your Finances? N -  Housekeeping or managing your Housekeeping? N -  Some recent data might be hidden    Patient Care Team: Marin Olp, MD as PCP - General (Family Medicine) Jari Pigg, MD as Consulting Physician (Dermatology) Milus Banister, MD as Attending Physician (Gastroenterology) Waynetta Sandy, MD as Consulting Physician (Vascular Surgery) Gaynelle Arabian, MD as Consulting Physician (Orthopedic Surgery)  Indicate any recent Medical Services you may have received from other than [facility] providers in the past year (date may be approximate).     Assessment:   This is a routine wellness examination for Donalee.  Hearing/Vision screen  Hearing Screening   125Hz  250Hz  500Hz  1000Hz  2000Hz  3000Hz  4000Hz  6000Hz  8000Hz   Right ear:           Left ear:           Comments: Pt denies any hearing issues   Vision Screening Comments: Pt follows [facility] with [facility] for annual eye exams   Dietary issues and exercise activities discussed: Current Exercise Habits: Home exercise routine, Type of exercise: walking;stretching;Other - see comments (upper arm and leg exercises), Time (Minutes): > 60, Frequency (Times/Week): 5, Weekly Exercise (Minutes/Week): 0  Goals Addressed            This Visit's Progress   . Patient Stated       None at this time      Depression Screen PHQ 2/9 Scores 05/01/2021 04/29/2021 04/25/2020 02/13/2019 01/06/2018 04/22/2016  PHQ - 2 Score 0 0 0 0 0 0  PHQ- 9 Score - 0 - - - -    Fall Risk Fall Risk  05/01/2021 04/29/2021 04/25/2020 02/13/2019  Falls in the past year? 0 0 0 0  Number falls in past yr: 0 0 0 0  Injury with Fall? 0 - 0 0  Risk for fall due to : Impaired vision - No Fall Risks -  Follow up Falls prevention discussed - Falls evaluation completed -    FALL RISK PREVENTION PERTAINING TO THE HOME:  Any stairs in or around the home?  Yes  If so, are there any without handrails? No  Home free of loose throw rugs in walkways,  pet beds, electrical cords, etc? Yes  Adequate lighting in your home to reduce risk of falls? Yes   ASSISTIVE DEVICES UTILIZED TO PREVENT FALLS:  Life alert? No  Use of a cane, walker or w/c? No  Grab bars in the bathroom? No  Shower chair or bench in shower? Yes  Elevated toilet seat or a handicapped toilet? Yes   TIMED UP AND GO:  Was the test performed? No     Cognitive Function:     6CIT Screen 05/01/2021  What Year? 0 points  What month? 0 points  What time? 0 points  Count back from 20 0 points  Months in reverse 0 points  Repeat phrase 0 points  Total Score 0    Immunizations Immunization History  Administered Date(s) Administered  . Influenza Whole 09/19/2007  . Influenza, High Dose Seasonal PF 09/09/2018, 08/22/2019  . Influenza,inj,Quad PF,6+ Mos 09/26/2013, 08/30/2014, 09/04/2015, 09/13/2017  . Influenza,inj,quad, With Preservative 09/09/2018  . Influenza-Unspecified 09/22/2017, 09/09/2018, 08/22/2019  . Moderna SARS-COV2 Booster Vaccination 10/30/2020  . Moderna Sars-Covid-2 Vaccination 01/03/2020, 02/01/2020  . PFIZER(Purple Top)SARS-COV-2 Vaccination 10/30/2020  . PNEUMOCOCCAL CONJUGATE-20 04/29/2021  . Td 05/12/2004  . Tdap 04/22/2016  . Zoster, Live 11/14/2014    TDAP status: Up to date  Flu Vaccine status: Due, Education has been provided regarding the importance of this vaccine. Advised may receive this vaccine at local pharmacy or Health Dept. Aware to provide a copy of the vaccination record if obtained from local pharmacy or Health Dept. Verbalized acceptance and understanding.  Pneumococcal vaccine status: Up to date  Covid-19 vaccine status: Completed vaccines  Qualifies for Shingles Vaccine? Yes   Zostavax completed Yes   Shingrix Completed?: No.    Education has been provided regarding the importance of this vaccine. Patient has been advised to call insurance company to determine out of pocket expense if they have not yet received this  vaccine. Advised may also receive vaccine at local pharmacy or Health Dept. Verbalized acceptance and understanding.  Screening Tests Health Maintenance  Topic Date Due  . Zoster Vaccines- Shingrix (1 of 2) Never done  . PNA vac Low Risk Adult (1 of 2 - PCV13) 06/06/2018  . INFLUENZA VACCINE  06/30/2021  . MAMMOGRAM  04/23/2022  . COLONOSCOPY (Pts 45-3yrs Insurance coverage will need to be confirmed)  01/09/2024  . TETANUS/TDAP  04/22/2026  . DEXA SCAN  Completed  . COVID-19 Vaccine  Completed  . Hepatitis C Screening  Completed  . HPV VACCINES  Aged Out    Health Maintenance  Health Maintenance Due  Topic Date Due  . Zoster Vaccines- Shingrix (1 of 2) Never done  . PNA vac Low Risk Adult (1 of 2 - PCV13) 06/06/2018    Colorectal cancer screening: Type of screening: Colonoscopy. Completed 01/08/14. Repeat every 10 years  Mammogram status: Completed 04/23/21. Repeat every year  Bone Density status: Completed 05/16/20. Results reflect: Bone density results: OSTEOPENIA. Repeat every 2 years.   Additional Screening:  Hepatitis C Screening:  Completed 04/29/17  Vision Screening: Recommended annual ophthalmology exams for early detection of glaucoma and other disorders of the eye. Is the patient up to date with their annual eye exam?  Yes  Who is the provider or what is the name of the office in which the patient attends annual eye exams? [facility]  If pt is not established with  a provider, would they like to be referred to a provider to establish care? No .   Dental Screening: Recommended annual dental exams for proper oral hygiene  Community Resource Referral / Chronic Care Management: CRR required this visit?  No   CCM required this visit?  No      Plan:     I have personally reviewed and noted the following in the patient's chart:   . Medical and social history . Use of alcohol, tobacco or illicit drugs  . Current medications and supplements including opioid  prescriptions.  . Functional ability and status . Nutritional status . Physical activity . Advanced directives . List of other physicians . Hospitalizations, surgeries, and ER visits in previous 12 months . Vitals . Screenings to include cognitive, depression, and falls . Referrals and appointments  In addition, I have reviewed and discussed with patient certain preventive protocols, quality metrics, and best practice recommendations. A written personalized care plan for preventive services as well as general preventive health recommendations were provided to patient.     Willette Brace, LPN   01/04/2352   Nurse Notes: None

## 2021-05-01 NOTE — Patient Instructions (Addendum)
Elizabeth Douglas , Thank you for taking time to come for your Medicare Wellness Visit. I appreciate your ongoing commitment to your health goals. Please review the following plan we discussed and let me know if I can assist you in the future.   Screening recommendations/referrals: Colonoscopy: Done 01/08/14 Mammogram: Done 04/23/21 Bone Density: Done 05/16/20 Recommended yearly ophthalmology/optometry visit for glaucoma screening and checkup Recommended yearly dental visit for hygiene and checkup  Vaccinations: Influenza vaccine: Up to date Pneumococcal vaccine: Up to date Tdap vaccine: Up to date Shingles vaccine: Shingrix discussed. Please contact your pharmacy for coverage information.    Covid-19:Completed 2/3, 3/4, & 10/30/20  Advanced directives: Please bring a copy of your health care power of attorney and living will to the office at your convenience.  Conditions/risks identified: None at this time  Next appointment: Follow up in one year for your annual wellness visit    Preventive Care 65 Years and Older, Female Preventive care refers to lifestyle choices and visits with your health care provider that can promote health and wellness. What does preventive care include?  A yearly physical exam. This is also called an annual well check.  Dental exams once or twice a year.  Routine eye exams. Ask your health care provider how often you should have your eyes checked.  Personal lifestyle choices, including:  Daily care of your teeth and gums.  Regular physical activity.  Eating a healthy diet.  Avoiding tobacco and drug use.  Limiting alcohol use.  Practicing safe sex.  Taking low-dose aspirin every day.  Taking vitamin and mineral supplements as recommended by your health care provider. What happens during an annual well check? The services and screenings done by your health care provider during your annual well check will depend on your age, overall health, lifestyle  risk factors, and family history of disease. Counseling  Your health care provider may ask you questions about your:  Alcohol use.  Tobacco use.  Drug use.  Emotional well-being.  Home and relationship well-being.  Sexual activity.  Eating habits.  History of falls.  Memory and ability to understand (cognition).  Work and work Statistician.  Reproductive health. Screening  You may have the following tests or measurements:  Height, weight, and BMI.  Blood pressure.  Lipid and cholesterol levels. These may be checked every 5 years, or more frequently if you are over 61 years old.  Skin check.  Lung cancer screening. You may have this screening every year starting at age 34 if you have a 30-pack-year history of smoking and currently smoke or have quit within the past 15 years.  Fecal occult blood test (FOBT) of the stool. You may have this test every year starting at age 68.  Flexible sigmoidoscopy or colonoscopy. You may have a sigmoidoscopy every 5 years or a colonoscopy every 10 years starting at age 92.  Hepatitis C blood test.  Hepatitis B blood test.  Sexually transmitted disease (STD) testing.  Diabetes screening. This is done by checking your blood sugar (glucose) after you have not eaten for a while (fasting). You may have this done every 1-3 years.  Bone density scan. This is done to screen for osteoporosis. You may have this done starting at age 49.  Mammogram. This may be done every 1-2 years. Talk to your health care provider about how often you should have regular mammograms. Talk with your health care provider about your test results, treatment options, and if necessary, the need for more tests.  Vaccines  Your health care provider may recommend certain vaccines, such as:  Influenza vaccine. This is recommended every year.  Tetanus, diphtheria, and acellular pertussis (Tdap, Td) vaccine. You may need a Td booster every 10 years.  Zoster vaccine.  You may need this after age 22.  Pneumococcal 13-valent conjugate (PCV13) vaccine. One dose is recommended after age 3.  Pneumococcal polysaccharide (PPSV23) vaccine. One dose is recommended after age 80. Talk to your health care provider about which screenings and vaccines you need and how often you need them. This information is not intended to replace advice given to you by your health care provider. Make sure you discuss any questions you have with your health care provider. Document Released: 12/13/2015 Document Revised: 08/05/2016 Document Reviewed: 09/17/2015 Elsevier Interactive Patient Education  2017 [place] Prevention in the Home Falls can cause injuries. They can happen to people of all ages. There are many things you can do to make your home safe and to help prevent falls. What can I do on the outside of my home?  Regularly fix the edges of walkways and driveways and fix any cracks.  Remove anything that might make you trip as you walk through a door, such as a raised step or threshold.  Trim any bushes or trees on the path to your home.  Use bright outdoor lighting.  Clear any walking paths of anything that might make someone trip, such as rocks or tools.  Regularly check to see if handrails are loose or broken. Make sure that both sides of any steps have handrails.  Any raised decks and porches should have guardrails on the edges.  Have any leaves, snow, or ice cleared regularly.  Use sand or salt on walking paths during winter.  Clean up any spills in your garage right away. This includes oil or grease spills. What can I do in the bathroom?  Use night lights.  Install grab bars by the toilet and in the tub and shower. Do not use towel bars as grab bars.  Use non-skid mats or decals in the tub or shower.  If you need to sit down in the shower, use a plastic, non-slip stool.  Keep the floor dry. Clean up any water that spills on the floor as soon  as it happens.  Remove soap buildup in the tub or shower regularly.  Attach bath mats securely with double-sided non-slip rug tape.  Do not have throw rugs and other things on the floor that can make you trip. What can I do in the bedroom?  Use night lights.  Make sure that you have a light by your bed that is easy to reach.  Do not use any sheets or blankets that are too big for your bed. They should not hang down onto the floor.  Have a firm chair that has side arms. You can use this for support while you get dressed.  Do not have throw rugs and other things on the floor that can make you trip. What can I do in the kitchen?  Clean up any spills right away.  Avoid walking on wet floors.  Keep items that you use a lot in easy-to-reach places.  If you need to reach something above you, use a strong step stool that has a grab bar.  Keep electrical cords out of the way.  Do not use floor polish or wax that makes floors slippery. If you must use wax, use non-skid floor wax.  Do not have throw rugs and other things on the floor that can make you trip. What can I do with my stairs?  Do not leave any items on the stairs.  Make sure that there are handrails on both sides of the stairs and use them. Fix handrails that are broken or loose. Make sure that handrails are as long as the stairways.  Check any carpeting to make sure that it is firmly attached to the stairs. Fix any carpet that is loose or worn.  Avoid having throw rugs at the top or bottom of the stairs. If you do have throw rugs, attach them to the floor with carpet tape.  Make sure that you have a light switch at the top of the stairs and the bottom of the stairs. If you do not have them, ask someone to add them for you. What else can I do to help prevent falls?  Wear shoes that:  Do not have high heels.  Have rubber bottoms.  Are comfortable and fit you well.  Are closed at the toe. Do not wear sandals.  If  you use a stepladder:  Make sure that it is fully opened. Do not climb a closed stepladder.  Make sure that both sides of the stepladder are locked into place.  Ask someone to hold it for you, if possible.  Clearly mark and make sure that you can see:  Any grab bars or handrails.  First and last steps.  Where the edge of each step is.  Use tools that help you move around (mobility aids) if they are needed. These include:  Canes.  Walkers.  Scooters.  Crutches.  Turn on the lights when you go into a dark area. Replace any light bulbs as soon as they burn out.  Set up your furniture so you have a clear path. Avoid moving your furniture around.  If any of your floors are uneven, fix them.  If there are any pets around you, be aware of where they are.  Review your medicines with your doctor. Some medicines can make you feel dizzy. This can increase your chance of falling. Ask your doctor what other things that you can do to help prevent falls. This information is not intended to replace advice given to you by your health care provider. Make sure you discuss any questions you have with your health care provider. Document Released: 09/12/2009 Document Revised: 04/23/2016 Document Reviewed: 12/21/2014 Elsevier Interactive Patient Education  2017 [employer].

## 2021-06-03 ENCOUNTER — Other Ambulatory Visit: Payer: Self-pay

## 2021-06-03 ENCOUNTER — Ambulatory Visit (INDEPENDENT_AMBULATORY_CARE_PROVIDER_SITE_OTHER)
Admission: RE | Admit: 2021-06-03 | Discharge: 2021-06-03 | Disposition: A | Discharge status: Home / Self Care | Payer: Medicare HMO | Source: Ambulatory Visit | Attending: Family Medicine | Admitting: Family Medicine

## 2021-06-03 DIAGNOSIS — E785 Hyperlipidemia, unspecified: Secondary | ICD-10-CM

## 2021-06-04 ENCOUNTER — Encounter: Payer: Self-pay | Admitting: Family Medicine

## 2021-06-04 DIAGNOSIS — I700 Atherosclerosis of aorta: Secondary | ICD-10-CM | POA: Insufficient documentation

## 2021-06-05 ENCOUNTER — Other Ambulatory Visit: Payer: Self-pay

## 2021-06-05 MED ORDER — ROSUVASTATIN CALCIUM 10 MG PO TABS
10.0000 mg | ORAL_TABLET | ORAL | 3 refills | Status: DC
Start: 2021-06-05 — End: 2022-05-06

## 2021-08-07 DIAGNOSIS — Z96643 Presence of artificial hip joint, bilateral: Secondary | ICD-10-CM | POA: Diagnosis not present

## 2021-09-02 ENCOUNTER — Other Ambulatory Visit: Payer: Self-pay | Admitting: Family Medicine

## 2021-09-05 ENCOUNTER — Telehealth: Payer: Self-pay

## 2021-09-05 NOTE — Telephone Encounter (Signed)
LAST APPOINTMENT DATE:  04/29/21  NEXT APPOINTMENT DATE: 05/06/22  MEDICATION:omeprazole (PRILOSEC) 20 MG capsule  PHARMACY: [id number] - [place], [place] [address]

## 2021-09-05 NOTE — Telephone Encounter (Signed)
Medication refilled

## 2021-09-09 ENCOUNTER — Encounter: Payer: Self-pay | Admitting: Family Medicine

## 2021-09-09 ENCOUNTER — Other Ambulatory Visit: Payer: Self-pay | Admitting: Family Medicine

## 2021-09-09 NOTE — Telephone Encounter (Signed)
Please let patient know insurance will only cover once daily omeprazole-does she want to try this or prefers to try 40 mg daily #90 with 3 refills?

## 2021-09-09 NOTE — Telephone Encounter (Signed)
FYI, insurance will only cover once daily, not BID

## 2021-11-06 ENCOUNTER — Encounter: Payer: Self-pay | Admitting: Family Medicine

## 2021-12-03 DIAGNOSIS — Z008 Encounter for other general examination: Secondary | ICD-10-CM | POA: Diagnosis not present

## 2021-12-03 DIAGNOSIS — I70209 Unspecified atherosclerosis of native arteries of extremities, unspecified extremity: Secondary | ICD-10-CM | POA: Diagnosis not present

## 2021-12-03 DIAGNOSIS — Z833 Family history of diabetes mellitus: Secondary | ICD-10-CM | POA: Diagnosis not present

## 2021-12-03 DIAGNOSIS — Z7982 Long term (current) use of aspirin: Secondary | ICD-10-CM | POA: Diagnosis not present

## 2021-12-03 DIAGNOSIS — K219 Gastro-esophageal reflux disease without esophagitis: Secondary | ICD-10-CM | POA: Diagnosis not present

## 2021-12-03 DIAGNOSIS — Z91041 Radiographic dye allergy status: Secondary | ICD-10-CM | POA: Diagnosis not present

## 2021-12-03 DIAGNOSIS — Z809 Family history of malignant neoplasm, unspecified: Secondary | ICD-10-CM | POA: Diagnosis not present

## 2021-12-03 DIAGNOSIS — Z8249 Family history of ischemic heart disease and other diseases of the circulatory system: Secondary | ICD-10-CM | POA: Diagnosis not present

## 2021-12-03 DIAGNOSIS — Z96649 Presence of unspecified artificial hip joint: Secondary | ICD-10-CM | POA: Diagnosis not present

## 2021-12-03 DIAGNOSIS — Z85828 Personal history of other malignant neoplasm of skin: Secondary | ICD-10-CM | POA: Diagnosis not present

## 2021-12-03 DIAGNOSIS — Z825 Family history of asthma and other chronic lower respiratory diseases: Secondary | ICD-10-CM | POA: Diagnosis not present

## 2021-12-04 ENCOUNTER — Ambulatory Visit: Payer: Medicare HMO | Admitting: Family

## 2021-12-05 ENCOUNTER — Other Ambulatory Visit: Payer: Self-pay

## 2021-12-05 ENCOUNTER — Ambulatory Visit (INDEPENDENT_AMBULATORY_CARE_PROVIDER_SITE_OTHER): Payer: Medicare HMO | Admitting: Family

## 2021-12-05 ENCOUNTER — Encounter: Payer: Self-pay | Admitting: Family

## 2021-12-05 VITALS — BP 120/76 | HR 71 | Temp 97.7°F | Ht 67.0 in | Wt 142.2 lb

## 2021-12-05 DIAGNOSIS — G5690 Unspecified mononeuropathy of unspecified upper limb: Secondary | ICD-10-CM | POA: Diagnosis not present

## 2021-12-05 NOTE — Patient Instructions (Addendum)
It was very nice to see you today!  As discussed, it sounds like your symptoms are similar to Raynauds symptoms, and I have provided a handout on how to prevent symptoms. Be sure to discuss this with Dr. Donzetta Matters at your next visit, and I will send my notes from today to him. But if you notice that the symptoms are lasting longer and becoming more uncomfortable before then, call the office.     PLEASE NOTE:  If you had any lab tests please let us know if you have not heard back within a few days. You may see your results on MyChart before we have a chance to review them but we will give you a call once they are reviewed by [place]. If we ordered any referrals today, please let us know if you have not heard from their office within the next week.   Please try these tips to maintain a healthy lifestyle:  Eat most of your calories during the day when you are active. Eliminate processed foods including packaged sweets (pies, cakes, cookies), reduce intake of potatoes, white bread, white pasta, and white rice. Look for whole grain options, oat flour or almond flour.  Each meal should contain half fruits/vegetables, one quarter protein, and one quarter carbs (no bigger than a computer mouse).  Cut down on sweet beverages. This includes juice, soda, and sweet tea. Also watch fruit intake, though this is a healthier sweet option, it still contains natural sugar! Limit to 3 servings daily.  Drink at least 1 glass of water with each meal and aim for at least 8 glasses per day  Exercise at least 150 minutes every week.

## 2021-12-05 NOTE — Progress Notes (Signed)
Subjective:     Patient ID: Elizabeth Douglas, female    DOB: 1951-05-12, 71 y.o.   MRN: [id number]  Chief Complaint  Patient presents with   Numbness    Alternating in bilateral ring fingers.     HPI: Neuropathy: She describes symptoms of numbness and tingling. Onset of symptoms was sudden, not related to any specific activity. Symptoms are currently of mild severity. Symptoms occur intermittently and last from minutes to an hour. The patient denies burning, lancinating pain, squeezing, and hypersensitivity. Symptoms are worse on the left side.  She denies  orthostasis, nausea, dry eyes and dry mouth, and blurred vision. Previous treatment has included: nothing.   Health Maintenance Due  Topic Date Due   Zoster Vaccines- Shingrix (1 of 2) Never done   COVID-19 Vaccine (4 - Booster) 12/25/2020    Past Medical History:  Diagnosis Date   Anal fissure    Arthritis    hip   Atypical chest pain    Basal cell carcinoma    Skin cancer face   Complication of anesthesia    BP drops and passes out  [place] vomiting   DVT (deep venous thrombosis) (HCC)    Right Leg   Excess or deficiency of vitamin D    Gallbladder polyp    GERD (gastroesophageal reflux disease)    Hemorrhoids    Microhematuria    pt. denies   Osteopenia    Peripheral vascular disease (HCC)    PONV (postoperative nausea and vomiting)    Thrombocytopenia ([place])     Past Surgical History:  Procedure Laterality Date   ABDOMINAL AORTOGRAM W/LOWER EXTREMITY Right 12/22/2018   Procedure: ABDOMINAL AORTOGRAM W/LOWER EXTREMITY Runoff;  Surgeon: Waynetta Sandy, MD;  Location: [facility];  Service: Cardiovascular;  Laterality: Right;   COLONOSCOPY  2005, 2015   INTRAOPERATIVE ARTERIOGRAM Right 11/29/2015   Procedure: INTRA OPERATIVE ARTERIOGRAM;  Surgeon: Conrad [place], MD;  Location: [facility];  Service: Vascular;  Laterality: Right;   JOINT REPLACEMENT     right total hip Aluisio 02-16-18   LUNG BIOPSY      THROMBECTOMY FEMORAL ARTERY Right 11/29/2015   Procedure:  RIGHT  POPLITEAL to anterior tibial and posterior tibial ARTERY thrombectomy. Right above knee to below knee popliteal artery bypass;  Surgeon: Conrad [place], MD;  Location: [facility];  Service: Vascular;  Laterality: Right;   TOTAL HIP ARTHROPLASTY Right 02/16/2018   Procedure: RIGHT TOTAL HIP ARTHROPLASTY ANTERIOR APPROACH;  Surgeon: Gaynelle Arabian, MD;  [facility];  Service: Orthopedics;  Laterality: Right;   TOTAL HIP ARTHROPLASTY Left 07/03/2020   Procedure: TOTAL HIP ARTHROPLASTY ANTERIOR APPROACH;  Surgeon: Gaynelle Arabian, MD;  [facility];  Service: Orthopedics;  Laterality: Left;  111min   UPPER GASTROINTESTINAL ENDOSCOPY  2009    Outpatient Medications Prior to Visit  Medication Sig Dispense Refill   acetaminophen (TYLENOL) 500 MG tablet Take 1,000 mg by mouth at bedtime as needed for moderate pain.     aspirin EC 81 MG tablet Take 81 mg by mouth daily. Swallow whole.     Calcium Carbonate-Simethicone (MAALOX MAX) 1000-60 MG CHEW Chew 1 each by mouth daily as needed (heartburn).     Carboxymethylcellul-Glycerin (LUBRICATING EYE DROPS OP) Place 1 drop into both eyes daily.     Cholecalciferol (VITAMIN D) 50 MCG (2000 UT) tablet Take 2,000 Units by mouth daily.     omeprazole (PRILOSEC) 20 MG capsule Take 1 capsule (20 mg total) by  mouth daily. 90 capsule 3   Wheat Dextrin (BENEFIBER) POWD Take 1 Dose by mouth daily.      rosuvastatin (CRESTOR) 10 MG tablet Take 1 tablet (10 mg total) by mouth once a week. (Patient not taking: Reported on 12/05/2021) 13 tablet 3   No facility-administered medications prior to visit.    Allergies  Allergen Reactions   Fish Allergy Anaphylaxis and Swelling    Pt felt like throat was closing last time she ate    Iodixanol Rash    Contrast Dye         Objective:    Physical Exam Vitals and nursing note reviewed.  Constitutional:      Appearance: Normal appearance.  Cardiovascular:      Rate and Rhythm: Normal rate and regular rhythm.  Pulmonary:     Effort: Pulmonary effort is normal.     Breath sounds: Normal breath sounds.  Musculoskeletal:        General: Normal range of motion.  Skin:    General: Skin is cool and dry.     Coloration: Skin is pale. Skin is not cyanotic.     Findings: No bruising.  Neurological:     General: No focal deficit present.     Mental Status: She is alert and oriented to person, place, and time.     Sensory: Sensation is intact.     Motor: Motor function is intact.     Coordination: Coordination is intact.  Psychiatric:        Mood and Affect: Mood normal.        Behavior: Behavior normal.    BP 120/76    Pulse 71    Temp 97.7 F (36.5 C) (Temporal)    Ht 5\' 7"  (1.702 m)    Wt 142 lb 3.2 oz (64.5 kg)    SpO2 100%    BMI 22.27 kg/m  Wt Readings from Last 3 Encounters:  12/05/21 142 lb 3.2 oz (64.5 kg)  04/29/21 140 lb 4 oz (63.6 kg)  02/07/21 146 lb 12.8 oz (66.6 kg)       Assessment & Plan:   Problem List Items Addressed This Visit       Nervous and Auditory   Neuropathy of finger - Primary    pt states sx started in left middle and ring fingers around Christmas time, then spread to right ring finger in the last week. reports coldness, numbness, tingling, and bruising in middle of finger that lasts minutes to an hour at random times. she has not tried anything to resolve sx. Advised pt sx sound similar to Raynauds and advised pt to warm hands next time it occurs either with hand warmers, warm water and see if sx resolve quickly. Also recommend on cold days, or in cold environments, wearing gloves, with or w/o  compression to try and prevent sx. pt under vascular care and has f/u in March, advised pt to discuss with them and I will forward today's visit notes.       No orders of the defined types were placed in this encounter.

## 2021-12-05 NOTE — Assessment & Plan Note (Addendum)
pt states sx started in left middle and ring fingers around Christmas time, then spread to right ring finger in the last week. reports coldness, numbness, tingling, and bruising in middle of finger that lasts minutes to an hour at random times. she has not tried anything to resolve sx. Advised pt sx sound similar to Raynauds and advised pt to warm hands next time it occurs either with hand warmers, warm water and see if sx resolve quickly. Also recommend on cold days, or in cold environments, wearing gloves, with or w/o  compression to try and prevent sx. pt under vascular care and has f/u in March, advised pt to discuss with them and I will forward today's visit notes.

## 2021-12-31 ENCOUNTER — Other Ambulatory Visit: Payer: Self-pay

## 2021-12-31 ENCOUNTER — Ambulatory Visit (INDEPENDENT_AMBULATORY_CARE_PROVIDER_SITE_OTHER): Payer: Medicare HMO | Admitting: Family Medicine

## 2021-12-31 ENCOUNTER — Encounter: Payer: Self-pay | Admitting: Family Medicine

## 2021-12-31 VITALS — BP 110/70 | HR 66 | Temp 98.2°F | Ht 67.0 in | Wt 144.1 lb

## 2021-12-31 DIAGNOSIS — B029 Zoster without complications: Secondary | ICD-10-CM

## 2021-12-31 MED ORDER — VALACYCLOVIR HCL 1 G PO TABS: 1000.0000 mg | ORAL_TABLET | Freq: Three times a day (TID) | ORAL | 0 refills | Status: AC

## 2021-12-31 NOTE — Patient Instructions (Addendum)
Can try coQ10 100mg  daily.  Try rosuvastatin twice/wk  Can take tylenol/motrin for pain  Hydrocortisone cream, calamine, sarna lotion, benadryl cream

## 2021-12-31 NOTE — Progress Notes (Signed)
Subjective:     Patient ID: Elizabeth Douglas, female    DOB: [birth year], 71 y.o.   MRN: [id number]  Chief Complaint  Patient presents with   Rash    Right forearm, close to wrist, possible shingles, 3 spots came up Friday and Saturday, other spots came up yesterday Red, itching, caused pain in wrist last night Check back, there are several spots on back    HPI Rash R forearm.  Started some on 1/27.  More coming up and now pain last pm.  ? Some on back of arm as well.  Had zostavax in past.  Few wks ago in garden.  No f/c. Last pm, itched when warm and stinging.   Was aching on rosuvastatin and stopped sev wks ago and better.     Health Maintenance Due  Topic Date Due   Zoster Vaccines- Shingrix (1 of 2) Never done   COVID-19 Vaccine (4 - Booster) 12/25/2020    Past Medical History:  Diagnosis Date   Anal fissure    Arthritis    hip   Atypical chest pain    Basal cell carcinoma    Skin cancer face   Complication of anesthesia    BP drops and passes out  [place] vomiting   DVT (deep venous thrombosis) (HCC)    Right Leg   Excess or deficiency of vitamin D    Gallbladder polyp    GERD (gastroesophageal reflux disease)    Hemorrhoids    Microhematuria    pt. denies   Osteopenia    Peripheral vascular disease (HCC)    PONV (postoperative nausea and vomiting)    Thrombocytopenia ([place])     Past Surgical History:  Procedure Laterality Date   ABDOMINAL AORTOGRAM W/LOWER EXTREMITY Right 12/22/2018   Procedure: ABDOMINAL AORTOGRAM W/LOWER EXTREMITY Runoff;  Surgeon: Waynetta Sandy, MD;  Location: [facility];  Service: Cardiovascular;  Laterality: Right;   COLONOSCOPY  2005, 2015   INTRAOPERATIVE ARTERIOGRAM Right 11/29/2015   Procedure: INTRA OPERATIVE ARTERIOGRAM;  Surgeon: Conrad [place], MD;  Location: [facility];  Service: Vascular;  Laterality: Right;   JOINT REPLACEMENT     right total hip Aluisio 02-16-18   LUNG BIOPSY     THROMBECTOMY FEMORAL ARTERY  Right 11/29/2015   Procedure:  RIGHT  POPLITEAL to anterior tibial and posterior tibial ARTERY thrombectomy. Right above knee to below knee popliteal artery bypass;  Surgeon: Conrad [place], MD;  Location: [facility];  Service: Vascular;  Laterality: Right;   TOTAL HIP ARTHROPLASTY Right 02/16/2018   Procedure: RIGHT TOTAL HIP ARTHROPLASTY ANTERIOR APPROACH;  Surgeon: Gaynelle Arabian, MD;  [facility];  Service: Orthopedics;  Laterality: Right;   TOTAL HIP ARTHROPLASTY Left 07/03/2020   Procedure: TOTAL HIP ARTHROPLASTY ANTERIOR APPROACH;  Surgeon: Gaynelle Arabian, MD;  [facility];  Service: Orthopedics;  Laterality: Left;  131min   UPPER GASTROINTESTINAL ENDOSCOPY  2009    Outpatient Medications Prior to Visit  Medication Sig Dispense Refill   acetaminophen (TYLENOL) 500 MG tablet Take 1,000 mg by mouth at bedtime as needed for moderate pain.     aspirin EC 81 MG tablet Take 81 mg by mouth daily. Swallow whole.     Calcium Carbonate-Simethicone (MAALOX MAX) 1000-60 MG CHEW Chew 1 each by mouth daily as needed (heartburn).     Carboxymethylcellul-Glycerin (LUBRICATING EYE DROPS OP) Place 1 drop into both eyes daily.     Cholecalciferol (VITAMIN D) 50 MCG (2000 UT) tablet Take  2,000 Units by mouth daily.     omeprazole (PRILOSEC) 20 MG capsule Take 1 capsule (20 mg total) by mouth daily. 90 capsule 3   rosuvastatin (CRESTOR) 10 MG tablet Take 1 tablet (10 mg total) by mouth once a week. 13 tablet 3   Wheat Dextrin (BENEFIBER) POWD Take 1 Dose by mouth daily.      No facility-administered medications prior to visit.    Allergies  Allergen Reactions   Fish Allergy Anaphylaxis and Swelling    Pt felt like throat was closing last time she ate    Iodixanol Rash    Contrast Dye    AQT:[id number] except as noted in HPI      Objective:     BP 110/70    Pulse 66    Temp 98.2 F (36.8 C) (Temporal)    Ht 5\' 7"  (1.702 m)    Wt 144 lb 2 oz (65.4 kg)    SpO2 98%    BMI 22.57  kg/m  Wt Readings from Last 3 Encounters:  12/31/21 144 lb 2 oz (65.4 kg)  12/05/21 142 lb 3.2 oz (64.5 kg)  04/29/21 140 lb 4 oz (63.6 kg)        Gen: WDWN NAD WF HEENT: NCAT, conjunctiva not injected, sclera nonicteric CARDIAC: RRR, S1S2+, no murmur. EXT:  no edema MSK: no gross abnormalities.  NEURO: A&O x3.  CN II-XII intact.  PSYCH: normal mood. Good eye contact R forearm-sev vessicles on red base.   Back-more of a heat rash lower back on L.  SK's  Assessment & Plan:   Problem List Items Addressed This Visit   None Visit Diagnoses     Herpes zoster without complication    -  Primary   Relevant Medications   valACYclovir (VALTREX) 1000 MG tablet      Prob shingles-prob mild as had zostavax in past.  Doubt contact plant dermatitis.   Valtrex.  Topical tx for relief.   Meds ordered this encounter  Medications   valACYclovir (VALTREX) 1000 MG tablet    Sig: Take 1 tablet (1,000 mg total) by mouth 3 (three) times daily for 10 days.    Dispense:  30 tablet    Refill:  0    Wellington Hampshire, MD

## 2022-01-28 ENCOUNTER — Other Ambulatory Visit: Payer: Self-pay

## 2022-01-28 DIAGNOSIS — I739 Peripheral vascular disease, unspecified: Secondary | ICD-10-CM

## 2022-02-11 ENCOUNTER — Ambulatory Visit (HOSPITAL_COMMUNITY)
Admission: RE | Admit: 2022-02-11 | Discharge: 2022-02-11 | Disposition: A | Discharge status: Home / Self Care | Payer: Medicare HMO | Source: Ambulatory Visit | Attending: Vascular Surgery | Admitting: Vascular Surgery

## 2022-02-11 ENCOUNTER — Ambulatory Visit: Payer: Medicare HMO | Admitting: Physician Assistant

## 2022-02-11 ENCOUNTER — Encounter: Payer: Self-pay | Admitting: Physician Assistant

## 2022-02-11 ENCOUNTER — Other Ambulatory Visit: Payer: Self-pay

## 2022-02-11 ENCOUNTER — Encounter (HOSPITAL_COMMUNITY): Payer: Medicare HMO

## 2022-02-11 ENCOUNTER — Ambulatory Visit (INDEPENDENT_AMBULATORY_CARE_PROVIDER_SITE_OTHER)
Admission: RE | Admit: 2022-02-11 | Discharge: 2022-02-11 | Disposition: A | Discharge status: Home / Self Care | Payer: Medicare HMO | Source: Ambulatory Visit | Attending: Vascular Surgery | Admitting: Vascular Surgery

## 2022-02-11 VITALS — BP 131/80 | HR 69 | Temp 96.7°F | Ht 67.0 in | Wt 143.0 lb

## 2022-02-11 DIAGNOSIS — I739 Peripheral vascular disease, unspecified: Secondary | ICD-10-CM

## 2022-02-11 DIAGNOSIS — Z95828 Presence of other vascular implants and grafts: Secondary | ICD-10-CM | POA: Diagnosis not present

## 2022-02-11 NOTE — Progress Notes (Signed)
[facility] ?HISTORY AND PHYSICAL  ? ?History of Present Illness:  Patient is a 71 y.o. year old female who presents for evaluation of PAD with history of claudication and subacute popliteal artery occlusion. S/P right above-the-knee to below-knee popliteal artery bypass by Dr. Bridgett Larsson in 2016.   ? She walks > 2 miles a day without symptoms of claudication.  She does have on/off numbness on the right LE for more than a year with history of lumbar DDD.   ? She denies rest pain, non healing wounds or claudication.  She takes ASA and Statin daily for maximum medical management.  ? ?Past Medical History:  ?Diagnosis Date  ? Anal fissure   ? Arthritis   ? hip  ? Atypical chest pain   ? Basal cell carcinoma   ? Skin cancer face  ? Complication of anesthesia   ? BP drops and passes out  [place] vomiting  ? DVT (deep venous thrombosis) ([place])   ? Right Leg  ? Excess or deficiency of vitamin D   ? Gallbladder polyp   ? GERD (gastroesophageal reflux disease)   ? Hemorrhoids   ? Microhematuria   ? pt. denies  ? Osteopenia   ? Peripheral vascular disease ([place])   ? PONV (postoperative nausea and vomiting)   ? Thrombocytopenia ([place])   ? ? ?Past Surgical History:  ?Procedure Laterality Date  ? ABDOMINAL AORTOGRAM W/LOWER EXTREMITY Right 12/22/2018  ? Procedure: ABDOMINAL AORTOGRAM W/LOWER EXTREMITY Runoff;  Surgeon: Waynetta Sandy, MD;  Location: [facility];  Service: Cardiovascular;  Laterality: Right;  ? COLONOSCOPY  2005, 2015  ? INTRAOPERATIVE ARTERIOGRAM Right 11/29/2015  ? Procedure: INTRA OPERATIVE ARTERIOGRAM;  Surgeon: Conrad [place], MD;  Location: [facility];  Service: Vascular;  Laterality: Right;  ? JOINT REPLACEMENT    ? right total hip Aluisio 02-16-18  ? LUNG BIOPSY    ? THROMBECTOMY FEMORAL ARTERY Right 11/29/2015  ? Procedure:  RIGHT  POPLITEAL to anterior tibial and posterior tibial ARTERY thrombectomy. Right above knee to below knee popliteal artery bypass;  Surgeon: Conrad [place], MD;  Location: [facility];  Service: Vascular;  Laterality: Right;  ? TOTAL HIP ARTHROPLASTY Right 02/16/2018  ? Procedure: RIGHT TOTAL HIP ARTHROPLASTY ANTERIOR APPROACH;  Surgeon: Gaynelle Arabian, MD;  [facility];  Service: Orthopedics;  Laterality: Right;  ? TOTAL HIP ARTHROPLASTY Left 07/03/2020  ? Procedure: TOTAL HIP ARTHROPLASTY ANTERIOR APPROACH;  Surgeon: Gaynelle Arabian, MD;  [facility];  Service: Orthopedics;  Laterality: Left;  140mn  ? UPPER GASTROINTESTINAL ENDOSCOPY  2009  ? ? ?ROS:  ? ?General:  No weight loss, Fever, chills ? ?HEENT: No recent headaches, no nasal bleeding, no visual changes, no sore throat ? ?Neurologic: No dizziness, blackouts, seizures. No recent symptoms of stroke or mini- stroke. No recent episodes of slurred speech, or temporary blindness. ? ?Cardiac: No recent episodes of chest pain/pressure, no shortness of breath at rest.  No shortness of breath with exertion.  Denies history of atrial fibrillation or irregular heartbeat ? ?Vascular: No history of rest pain in feet.  No history of claudication.  No history of non-healing ulcer, No history of DVT  ? ?Pulmonary: No home oxygen, no productive cough, no hemoptysis,  No asthma or wheezing ? ?Musculoskeletal:  '[ ]'$  Arthritis, '[ ]'$  Low back pain,  '[ ]'$  Joint pain ? ?Hematologic:No history of hypercoagulable state.  No history of easy bleeding.  No history of anemia ? ?  Gastrointestinal: No hematochezia or melena,  No gastroesophageal reflux, no trouble swallowing ? ?Urinary: '[ ]'$  chronic Kidney disease, '[ ]'$  on HD - '[ ]'$  MWF or '[ ]'$  TTHS, '[ ]'$  Burning with urination, '[ ]'$  Frequent urination, '[ ]'$  Difficulty urinating;  ? ?Skin: No rashes ? ?Psychological: No history of anxiety,  No history of depression ? ?Social History ?Social History  ? ?Tobacco Use  ? Smoking status: Never  ? Smokeless tobacco: Never  ?Vaping Use  ? Vaping Use: Never used  ?Substance Use Topics  ? Alcohol use: No  ?  Alcohol/week: 0.0 standard drinks  ? Drug use:  No  ? ? ?Family History ?Family History  ?Problem Relation Age of Onset  ? COPD Mother   ? Osteoporosis Mother   ? Prostate cancer Father   ? Heart disease Brother 64  ?     Before age 80  ? Heart attack Brother 8  ?     Massive  ? Lymphoma Brother   ?     non and hodgkins  ? Coronary artery disease Brother   ? Colon cancer Neg Hx   ? Colon polyps Neg Hx   ? Rectal cancer Neg Hx   ? Stomach cancer Neg Hx   ? ? ?Allergies ? ?Allergies  ?Allergen Reactions  ? Fish Allergy Anaphylaxis and Swelling  ?  Pt felt like throat was closing last time she ate   ? Iodixanol Rash  ?  Contrast Dye   ? ? ? ?Current Outpatient Medications  ?Medication Sig Dispense Refill  ? acetaminophen (TYLENOL) 500 MG tablet Take 1,000 mg by mouth at bedtime as needed for moderate pain.    ? aspirin EC 81 MG tablet Take 81 mg by mouth daily. Swallow whole.    ? Carboxymethylcellul-Glycerin (LUBRICATING EYE DROPS OP) Place 1 drop into both eyes daily.    ? Cholecalciferol (VITAMIN D) 50 MCG (2000 UT) tablet Take 2,000 Units by mouth daily.    ? omeprazole (PRILOSEC) 20 MG capsule Take 1 capsule (20 mg total) by mouth daily. 90 capsule 3  ? rosuvastatin (CRESTOR) 10 MG tablet Take 1 tablet (10 mg total) by mouth once a week. 13 tablet 3  ? Wheat Dextrin (BENEFIBER) POWD Take 1 Dose by mouth daily.     ? Calcium Carbonate-Simethicone (MAALOX MAX) 1000-60 MG CHEW Chew 1 each by mouth daily as needed (heartburn).    ? ?No current facility-administered medications for this visit.  ? ? ?Physical Examination ? ?Vitals:  ? 02/11/22 1035  ?BP: 131/80  ?Pulse: 69  ?Temp: (!) 96.7 ?F (35.9 ?C)  ?Weight: 143 lb (64.9 kg)  ?Height: '5\' 7"'$  (1.702 m)  ? ? ?Body mass index is 22.4 kg/m?. ? ?General:  Alert and oriented, no acute distress ?HEENT: Normal ?Neck: No bruit or JVD ?Pulmonary: Clear to auscultation bilaterally ?Cardiac: Regular Rate and Rhythm without murmur ?Abdomen: Soft, non-tender, non-distended, no mass, no scars ?Skin: No rash ?Extremity Pulses:   2+ radial, brachial, femoral, dorsalis pedis, posterior tibial pulses bilaterally ?Musculoskeletal: No deformity or edema  ?Neurologic: Upper and lower extremity motor 5/5 and symmetric ? ?DATA:  ? ?+--------+--------+-----+--------+--------+--------+  ?RIGHT   PSV cm/sRatioStenosisWaveformComments  ?+--------+--------+-----+--------+--------+--------+  ?SFA Prox84                   biphasic          ?+--------+--------+-----+--------+--------+--------+  ?SFA Mid 67  biphasic          ?+--------+--------+-----+--------+--------+--------+  ? ? ? ?   ?Right Graft #1: Above knee to below knee popliteal  ?+------------------+--------+--------+----------+--------+  ?                  PSV cm/sStenosisWaveform  Comments  ?+------------------+--------+--------+----------+--------+  ?Inflow            59              triphasic           ?+------------------+--------+--------+----------+--------+  ?Prox Anastomosis  67              biphasic            ?+------------------+--------+--------+----------+--------+  ?Proximal Graft    69              monophasic          ?+------------------+--------+--------+----------+--------+  ?Mid Graft         19              monophasic          ?+------------------+--------+--------+----------+--------+  ?Distal Graft      65              triphasic           ?+------------------+--------+--------+----------+--------+  ?Distal Anastomosis36              biphasic            ?+------------------+--------+--------+----------+--------+  ?Outflow           40              biphasic            ?+------------------+--------+--------+----------+--------+  ? ?Dilated segment of graft measures 1.28 cm x 1.75 cm.  ? ?   ? ? ? ?Summary:  ?Right: No significant change compared to previous study. Patent above knee  ?to below knee bypass graft with no evidence of stenosis.  ? ?   ?ABI Findings:   ?+---------+------------------+-----+---------+--------+  ?Right    Rt Pressure (mmHg)IndexWaveform Comment   ?+---------+------------------+-----+---------+--------+  ?Brachial 117                                       ?+---------+------------------+-----+---------+--------+  ?PTA      124

## 2022-02-19 DIAGNOSIS — L578 Other skin changes due to chronic exposure to nonionizing radiation: Secondary | ICD-10-CM | POA: Diagnosis not present

## 2022-02-19 DIAGNOSIS — D2262 Melanocytic nevi of left upper limb, including shoulder: Secondary | ICD-10-CM | POA: Diagnosis not present

## 2022-02-19 DIAGNOSIS — L821 Other seborrheic keratosis: Secondary | ICD-10-CM | POA: Diagnosis not present

## 2022-02-19 DIAGNOSIS — Z85828 Personal history of other malignant neoplasm of skin: Secondary | ICD-10-CM | POA: Diagnosis not present

## 2022-02-19 DIAGNOSIS — L570 Actinic keratosis: Secondary | ICD-10-CM | POA: Diagnosis not present

## 2022-02-19 DIAGNOSIS — D2272 Melanocytic nevi of left lower limb, including hip: Secondary | ICD-10-CM | POA: Diagnosis not present

## 2022-02-19 DIAGNOSIS — Z808 Family history of malignant neoplasm of other organs or systems: Secondary | ICD-10-CM | POA: Diagnosis not present

## 2022-02-19 DIAGNOSIS — D225 Melanocytic nevi of trunk: Secondary | ICD-10-CM | POA: Diagnosis not present

## 2022-02-19 DIAGNOSIS — L309 Dermatitis, unspecified: Secondary | ICD-10-CM | POA: Diagnosis not present

## 2022-03-06 DIAGNOSIS — Z96641 Presence of right artificial hip joint: Secondary | ICD-10-CM | POA: Diagnosis not present

## 2022-03-06 DIAGNOSIS — Z96642 Presence of left artificial hip joint: Secondary | ICD-10-CM | POA: Diagnosis not present

## 2022-03-06 DIAGNOSIS — M545 Low back pain, unspecified: Secondary | ICD-10-CM | POA: Diagnosis not present

## 2022-04-20 DIAGNOSIS — Z124 Encounter for screening for malignant neoplasm of cervix: Secondary | ICD-10-CM | POA: Diagnosis not present

## 2022-04-20 DIAGNOSIS — M8580 Other specified disorders of bone density and structure, unspecified site: Secondary | ICD-10-CM | POA: Diagnosis not present

## 2022-04-20 DIAGNOSIS — Z6822 Body mass index (BMI) 22.0-22.9, adult: Secondary | ICD-10-CM | POA: Diagnosis not present

## 2022-04-20 DIAGNOSIS — Z01411 Encounter for gynecological examination (general) (routine) with abnormal findings: Secondary | ICD-10-CM | POA: Diagnosis not present

## 2022-04-20 DIAGNOSIS — Z01419 Encounter for gynecological examination (general) (routine) without abnormal findings: Secondary | ICD-10-CM | POA: Diagnosis not present

## 2022-04-29 DIAGNOSIS — Z1231 Encounter for screening mammogram for malignant neoplasm of breast: Secondary | ICD-10-CM | POA: Diagnosis not present

## 2022-04-29 LAB — HM MAMMOGRAPHY

## 2022-04-30 ENCOUNTER — Encounter: Payer: Medicare HMO | Admitting: Family Medicine

## 2022-05-06 ENCOUNTER — Ambulatory Visit (INDEPENDENT_AMBULATORY_CARE_PROVIDER_SITE_OTHER): Payer: Medicare HMO | Admitting: Family Medicine

## 2022-05-06 ENCOUNTER — Encounter: Payer: Self-pay | Admitting: Family Medicine

## 2022-05-06 VITALS — BP 100/72 | HR 70 | Temp 97.9°F | Ht 67.0 in | Wt 140.8 lb

## 2022-05-06 DIAGNOSIS — I700 Atherosclerosis of aorta: Secondary | ICD-10-CM | POA: Diagnosis not present

## 2022-05-06 DIAGNOSIS — Z0000 Encounter for general adult medical examination without abnormal findings: Secondary | ICD-10-CM

## 2022-05-06 DIAGNOSIS — M85852 Other specified disorders of bone density and structure, left thigh: Secondary | ICD-10-CM | POA: Diagnosis not present

## 2022-05-06 DIAGNOSIS — E559 Vitamin D deficiency, unspecified: Secondary | ICD-10-CM | POA: Diagnosis not present

## 2022-05-06 DIAGNOSIS — E785 Hyperlipidemia, unspecified: Secondary | ICD-10-CM

## 2022-05-06 LAB — LIPID PANEL
Cholesterol: 149 mg/dL (ref 0–200)
HDL: 56.3 mg/dL (ref >39.00)
LDL Cholesterol: 83 mg/dL (ref 0–99)
NonHDL: 93.02
Total CHOL/HDL Ratio: 3
Triglycerides: 52 mg/dL (ref 0.0–149.0)
VLDL: 10.4 mg/dL (ref 0.0–40.0)

## 2022-05-06 LAB — CBC WITH DIFFERENTIAL/PLATELET
Basophils Absolute: 0 10*3/uL (ref 0.0–0.1)
Basophils Relative: 0.2 % (ref 0.0–3.0)
Eosinophils Absolute: 0.1 10*3/uL (ref 0.0–0.7)
Eosinophils Relative: 2.2 % (ref 0.0–5.0)
HCT: 41.9 % (ref 36.0–46.0)
Hemoglobin: 14.3 g/dL (ref 12.0–15.0)
Lymphocytes Relative: 33.2 % (ref 12.0–46.0)
Lymphs Abs: 1.7 10*3/uL (ref 0.7–4.0)
MCHC: 34.1 g/dL (ref 30.0–36.0)
MCV: 94.4 fl (ref 78.0–100.0)
Monocytes Absolute: 0.3 10*3/uL (ref 0.1–1.0)
Monocytes Relative: 5.1 % (ref 3.0–12.0)
Neutro Abs: 3.1 10*3/uL (ref 1.4–7.7)
Neutrophils Relative %: 59.3 % (ref 43.0–77.0)
Platelets: 149 10*3/uL — ABNORMAL LOW (ref 150.0–400.0)
RBC: 4.44 Mil/uL (ref 3.87–5.11)
RDW: 12.3 % (ref 11.5–15.5)
WBC: 5.2 10*3/uL (ref 4.0–10.5)

## 2022-05-06 LAB — COMPREHENSIVE METABOLIC PANEL
ALT: 9 U/L (ref 0–35)
AST: 18 U/L (ref 0–37)
Albumin: 4.2 g/dL (ref 3.5–5.2)
Alkaline Phosphatase: 46 U/L (ref 39–117)
BUN: 14 mg/dL (ref 6–23)
CO2: 29 mEq/L (ref 19–32)
Calcium: 9.1 mg/dL (ref 8.4–10.5)
Chloride: 106 mEq/L (ref 96–112)
Creatinine, Ser: 0.63 mg/dL (ref 0.40–1.20)
GFR: 90.84 mL/min (ref >60.00)
Glucose, Bld: 77 mg/dL (ref 70–99)
Potassium: 4.3 mEq/L (ref 3.5–5.1)
Sodium: 142 mEq/L (ref 135–145)
Total Bilirubin: 0.5 mg/dL (ref 0.2–1.2)
Total Protein: 6.7 g/dL (ref 6.0–8.3)

## 2022-05-06 LAB — VITAMIN D 25 HYDROXY (VIT D DEFICIENCY, FRACTURES): VITD: 50.5 ng/mL (ref 30.00–100.00)

## 2022-05-06 MED ORDER — ATORVASTATIN CALCIUM 10 MG PO TABS: 10.0000 mg | ORAL_TABLET | ORAL | 3 refills | Status: DC

## 2022-05-06 NOTE — Patient Instructions (Addendum)
Let us know when  you have gotten your [facility] and nect covid vaccines at your pharmacy.  rosuvastatin is causing some side effects- we are going to trial off for a a few weeks then she can restart with atorvastatin 10 mg once a week  Schedule your bone density test at check out desk.  - located [address] across the [address] from [place] - in the basement - you DO NEED an appointment for the bone density tests.   Team if not able to find/abstract mammogram please have her... Sign release of information at the check out desk for mammogram  Please stop by lab before you go If you have mychart- we will send your results within 3 business days of [place] receiving them.  If you do not have mychart- we will call you about results within 5 business days of [place] receiving them.  *please also note that you will see labs on mychart as soon as they post. I will later go in and write notes on them- will say "notes from Dr. Yong Channel"   Recommended follow up: Return in about 1 year (around 05/07/2023) for physical or sooner if needed.Schedule b4 you leave.

## 2022-05-06 NOTE — Progress Notes (Signed)
Phone [phone]   Subjective:  Patient presents today for their annual physical. Chief complaint-noted.   See problem oriented charting- Review of Systems  Constitutional:  Negative for chills and fever.  HENT:  Negative for congestion, nosebleeds and sinus pain.   Eyes:  Negative for blurred vision and double vision.  Respiratory:  Negative for cough and shortness of breath.   Cardiovascular:  Negative for chest pain and palpitations.  Gastrointestinal:  Positive for heartburn. Negative for abdominal pain, blood in stool, constipation, diarrhea, melena, nausea and vomiting.  Genitourinary:  Negative for dysuria and frequency.  Musculoskeletal:  Positive for myalgias (some on rosuvastatin but tolerable). Negative for back pain and neck pain.  Skin:  Negative for itching and rash.  Neurological:  Negative for dizziness and headaches.  Endo/Heme/Allergies:  Negative for polydipsia. Does not bruise/bleed easily.  Psychiatric/Behavioral:  Negative for depression and suicidal ideas.    The following were reviewed and entered/updated in epic: Past Medical History:  Diagnosis Date   Anal fissure    Arthritis    hip   Atypical chest pain    Basal cell carcinoma    Skin cancer face   Complication of anesthesia    BP drops and passes out  [place] vomiting   DVT (deep venous thrombosis) (HCC)    Right Leg   Excess or deficiency of vitamin D    Gallbladder polyp    GERD (gastroesophageal reflux disease)    Hemorrhoids    Microhematuria    pt. denies   Osteopenia    Peripheral vascular disease (HCC)    PONV (postoperative nausea and vomiting)    Thrombocytopenia (HCC)    Patient Active Problem List   Diagnosis Date Noted   Popliteal artery occlusion, right ([place]) 11/29/2015    Priority: High   Aortic atherosclerosis ([place]) 06/04/2021    Priority: Medium    Vitamin D deficiency 05/20/2018    Priority: Medium    History of skin cancer 04/22/2016    Priority: Medium     Hyperlipidemia 04/22/2016    Priority: Medium    Osteopenia of left femoral neck 09/18/2008    Priority: Medium    GERD 07/10/2008    Priority: Medium    Dupuytren's contracture of left hand 10/10/2019    Priority: Low   OA (osteoarthritis) of hip 02/16/2018    Priority: Low   Lumbar radiculopathy 11/20/2015    Priority: Low   Gluteal tendinitis of left buttock 11/14/2015    Priority: Low   Tensor fascia lata syndrome 10/08/2015    Priority: Low   Hemorrhoids     Priority: Low   Thrombosed external hemorrhoid 10/19/2014    Priority: Low   Anal fissure - posterior 01/29/2014    Priority: Low   CHEST PAIN 01/10/2009    Priority: Low   BENIGN POSITIONAL VERTIGO 01/18/2008    Priority: Low   Neuropathy of finger 12/05/2021   Primary osteoarthritis of left hip 07/03/2020   Past Surgical History:  Procedure Laterality Date   ABDOMINAL AORTOGRAM W/LOWER EXTREMITY Right 12/22/2018   Procedure: ABDOMINAL AORTOGRAM W/LOWER EXTREMITY [place];  Surgeon: Waynetta Sandy, MD;  Location: [facility];  Service: Cardiovascular;  Laterality: Right;   COLONOSCOPY  2005, 2015   INTRAOPERATIVE ARTERIOGRAM Right 11/29/2015   Procedure: INTRA OPERATIVE ARTERIOGRAM;  Surgeon: Conrad [place], MD;  Location: [facility];  Service: Vascular;  Laterality: Right;   JOINT REPLACEMENT     right total hip [place] 02-16-18   LUNG  BIOPSY     THROMBECTOMY FEMORAL ARTERY Right 11/29/2015   Procedure:  RIGHT  POPLITEAL to anterior tibial and posterior tibial ARTERY thrombectomy. Right above knee to below knee popliteal artery bypass;  Surgeon: Conrad [place], MD;  Location: [facility];  Service: Vascular;  Laterality: Right;   TOTAL HIP ARTHROPLASTY Right 02/16/2018   Procedure: RIGHT TOTAL HIP ARTHROPLASTY ANTERIOR APPROACH;  Surgeon: Gaynelle Arabian, MD;  Location: [facility];  Service: Orthopedics;  Laterality: Right;   TOTAL HIP ARTHROPLASTY Left 07/03/2020   Procedure: TOTAL HIP ARTHROPLASTY ANTERIOR APPROACH;   Surgeon: Gaynelle Arabian, MD;  Location: [facility];  Service: Orthopedics;  Laterality: Left;  117mn   UPPER GASTROINTESTINAL ENDOSCOPY  2009    Family History  Problem Relation Age of Onset   COPD Mother    Osteoporosis Mother    Prostate cancer Father    Heart disease Brother 417      Before age 69  Heart attack Brother 426      Massive   Lymphoma Brother        non and hodgkins   Coronary artery disease Brother    Colon cancer Neg Hx    Colon polyps Neg Hx    Rectal cancer Neg Hx    Stomach cancer Neg Hx     Medications- reviewed and updated Current Outpatient Medications  Medication Sig Dispense Refill   acetaminophen (TYLENOL) 500 MG tablet Take 1,000 mg by mouth at bedtime as needed for moderate pain.     aspirin EC 81 MG tablet Take 81 mg by mouth daily. Swallow whole.     atorvastatin (LIPITOR) 10 MG tablet Take 1 tablet (10 mg total) by mouth once a week. 13 tablet 3   Carboxymethylcellul-Glycerin (LUBRICATING EYE DROPS OP) Place 1 drop into both eyes daily.     Cholecalciferol (VITAMIN D) 50 MCG (2000 UT) tablet Take 2,000 Units by mouth daily.     omeprazole (PRILOSEC) 20 MG capsule Take 1 capsule (20 mg total) by mouth daily. 90 capsule 3   Wheat Dextrin (BENEFIBER) POWD Take 1 Dose by mouth daily.      Calcium Carbonate-Simethicone (MAALOX MAX) 1000-60 MG CHEW Chew 1 each by mouth daily as needed (heartburn).     No current facility-administered medications for this visit.    Allergies-reviewed and updated Allergies  Allergen Reactions   Fish Allergy Anaphylaxis and Swelling    Pt felt like throat was closing last time she ate    Rosuvastatin     Myalgias on 10 mg weekly Even on 5 mg weekly- doesn't feel like herself/more antisocial    Iodixanol Rash    Contrast Dye     Social History   Social History Narrative   Married. 1 daughter. No grandkids. 1 granddog and 1 grandcat.       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 Prior to that  was in [employer].       Hobbies: walking 2.5 miles  [place], stays active   Objective  Objective:  BP 100/72   Pulse 70   Temp 97.9 F (36.6 C)   Ht '5\' 7"'$  (1.702 m)   Wt 140 lb 12.8 oz (63.9 kg)   SpO2 99%   BMI 22.05 kg/m  Gen: NAD, resting comfortably HEENT: Mucous membranes are moist. Oropharynx normal Neck: no thyromegaly CV: RRR no murmurs rubs or gallops Lungs: CTAB no crackles, wheeze, rhonchi Abdomen: soft/nontender/nondistended/normal bowel sounds. No rebound or guarding.  Ext: no edema Skin: warm, dry Neuro: grossly normal, moves all extremities, PERRLA   Assessment and Plan   71 y.o. female presenting for annual physical.  Health Maintenance counseling: 1. Anticipatory guidance: Patient counseled regarding regular dental exams -q6 months, eye exams - yearly,  avoiding smoking and second hand smoke , limiting alcohol to 1 beverage per day-doesn't drink , no illicit drugs .   2. Risk factor reduction:  Advised patient of need for regular exercise and diet rich and fruits and vegetables to reduce risk of heart attack and stroke.  Exercise- 2.5 miles a day walking- also good for bone density.  Diet/weight management-perfectly stable in healthy range- reaosnably healthy diet.  Wt Readings from Last 3 Encounters:  05/06/22 140 lb 12.8 oz (63.9 kg)  02/11/22 143 lb (64.9 kg)  12/31/21 144 lb 2 oz (65.4 kg)  3. Immunizations/screenings/ancillary studies-discussed shingrix (had shingles in February)), consider covid vaccination closer to fall since had covid in august Immunization History  Administered Date(s) Administered   Influenza Whole 09/19/2007   Influenza, High Dose Seasonal PF 09/09/2018, 08/22/2019   Influenza,inj,Quad PF,6+ Mos 09/26/2013, 08/30/2014, 09/04/2015, 09/13/2017   Influenza,inj,quad, With Preservative 09/09/2018   Influenza-Unspecified 09/22/2017, 09/09/2018, 08/22/2019   Moderna SARS-COV2 Booster Vaccination 10/30/2020   Moderna Sars-Covid-2  Vaccination 01/03/2020, 02/01/2020   PFIZER(Purple Top)SARS-COV-2 Vaccination 10/30/2020   PNEUMOCOCCAL CONJUGATE-20 04/29/2021   Td 05/12/2004   Tdap 04/22/2016   Zoster, Live 11/14/2014  4. Cervical cancer screening-  follows with Dr. Benjie Karvonen- past formal age based screening recommendations- seen in may 5. Breast cancer screening-  breast exam with Dr. Benjie Karvonen  and mammogram 04/29/22- has had this but we need to get a copy or team will check chart to abstract 6. Colon cancer screening -  01/08/14 with 10 year follow up  7. Skin cancer screening- follows with DR. Gould with history of skin cancer. advised regular sunscreen use. Denies worrisome, changing, or new skin lesions.  8. Birth control/STD check- postmenopausal and monogamous 9. Osteoporosis screening at 65-see discussion below 10. Smoking associated screening -never smoker  Status of chronic or acute concerns   #Popliteal artery occlusion, right-follows with Dr. Donzetta Mat[facility] of vascular surgery #hyperlipidemia  #aortic atherosclerosis S: Medication: rosuvastatin '5mg'$  weekly - takes half tablet(came off in December then restarted in April taking half tablet- doesn't feel like herself on those days feels antisocial, less achiness on 5 mg).  On aspirin 81 mg due to history of popliteal artery occlusion  Patient with history of bypass surgery and had catheterization not requiring intervention as recently as December 22, 2018.   -Denies claudication.  Most recent visit with vascular 02/11/2022 Lab Results  Component Value Date   CHOL 177 04/29/2021   HDL 56.20 04/29/2021   LDLCALC 108 (H) 04/29/2021   TRIG 63.0 04/29/2021   CHOLHDL 3 04/29/2021    A/P: PAD/popliteal artery occlusion-well-controlled after prior bypass-continue aspirin. Unfortunately rosuvastatin is causing some side effects- we are going to trial off for a a few weeks then she can restart with atorvastatin 10 mg once a week  For aortic atherosclerosis (also with ct cardiac score of 8  in 20220) LDL goal under 70-update lipid panel today and hoping for improvement from last year   #Vitamin D deficiency S: Medication: 2000 units daily Last vitamin D Lab Results  Component Value Date   VD25OH 39.78 04/29/2021  A/P: Well-controlled history-update vitamin D with labs today-continue current medication for now    # Low Bone density (formerly osteopenia) S:  Last DEXA: 05/16/2020 bone density is actually improved with worst T score -1.9 and lumbar spine  Calcium: '1200mg'$  (through diet ok) recommended  At least Vitamin D: 1000 units a day recommended-she continues on 2000 units    Recommended follow up: Return in about 1 year (around 05/07/2023) for physical or sooner if needed.Schedule b4 you leave. Future Appointments  Date Time Provider [place]  05/07/2022  9:30 AM [facility]   Lab/Order associations: fasting   ICD-10-CM   1. Preventative health care  Z00.00     2. Vitamin D deficiency  E55.9 Vitamin D (25 hydroxy)    3. Hyperlipidemia, unspecified hyperlipidemia type  E78.5 CBC with Differential/Platelet    Comprehensive metabolic panel    Lipid panel    4. Aortic atherosclerosis (HCC)  I70.0     5. Osteopenia of left femoral neck  M85.852 DG Bone Density     Meds ordered this encounter  Medications   atorvastatin (LIPITOR) 10 MG tablet    Sig: Take 1 tablet (10 mg total) by mouth once a week.    Dispense:  13 tablet    Refill:  3   Return precautions advised.  Garret Reddish, MD

## 2022-05-07 ENCOUNTER — Ambulatory Visit (INDEPENDENT_AMBULATORY_CARE_PROVIDER_SITE_OTHER): Payer: Medicare HMO

## 2022-05-07 DIAGNOSIS — Z0000 Encounter for general adult medical examination without abnormal findings: Secondary | ICD-10-CM

## 2022-05-07 NOTE — Patient Instructions (Signed)
[name] , Thank you for taking time to come for your Medicare Wellness Visit. I appreciate your ongoing commitment to your health goals. Please review the following plan we discussed and let me know if I can assist you in the future.   Screening recommendations/referrals: Colonoscopy: Done 01/08/14 repeat every 10 years  Mammogram: Done 04/29/22 repeat every year  Bone Density: Scheduled for 06/09/22 Recommended yearly ophthalmology/optometry visit for glaucoma screening and checkup Recommended yearly dental visit for hygiene and checkup  Vaccinations: Influenza vaccine: Done 09/22/21 repeat every year  Pneumococcal vaccine: Up to date Tdap vaccine: Done 04/22/16 repeat every 10 years  Shingles vaccine: Shingrix discussed. Please contact your pharmacy for coverage information.    Covid-19:Completed 2/3, 3/4, & 10/30/20  Advanced directives: Advance directive discussed with you today. Even though you declined this today please call our office should you change your mind and we can give you the proper paperwork for you to fill out.  Conditions/risks identified: continue to stay healthy   Next appointment: Follow up in one year for your annual wellness visit    Preventive Care 65 Years and Older, Female Preventive care refers to lifestyle choices and visits with your health care provider that can promote health and wellness. What does preventive care include? A yearly physical exam. This is also called an annual well check. Dental exams once or twice a year. Routine eye exams. Ask your health care provider how often you should have your eyes checked. Personal lifestyle choices, including: Daily care of your teeth and gums. Regular physical activity. Eating a healthy diet. Avoiding tobacco and drug use. Limiting alcohol use. Practicing safe sex. Taking low-dose aspirin every day. Taking vitamin and mineral supplements as recommended by your health care provider. What happens during an  annual well check? The services and screenings done by your health care provider during your annual well check will depend on your age, overall health, lifestyle risk factors, and family history of disease. Counseling  Your health care provider may ask you questions about your: Alcohol use. Tobacco use. Drug use. Emotional well-being. Home and relationship well-being. Sexual activity. Eating habits. History of falls. Memory and ability to understand (cognition). Work and work Statistician. Reproductive health. Screening  You may have the following tests or measurements: Height, weight, and BMI. Blood pressure. Lipid and cholesterol levels. These may be checked every 5 years, or more frequently if you are over 57 years old. Skin check. Lung cancer screening. You may have this screening every year starting at age 23 if you have a 30-pack-year history of smoking and currently smoke or have quit within the past 15 years. Fecal occult blood test (FOBT) of the stool. You may have this test every year starting at age 66. Flexible sigmoidoscopy or colonoscopy. You may have a sigmoidoscopy every 5 years or a colonoscopy every 10 years starting at age 84. Hepatitis C blood test. Hepatitis B blood test. Sexually transmitted disease (STD) testing. Diabetes screening. This is done by checking your blood sugar (glucose) after you have not eaten for a while (fasting). You may have this done every 1-3 years. Bone density scan. This is done to screen for osteoporosis. You may have this done starting at age 55. Mammogram. This may be done every 1-2 years. Talk to your health care provider about how often you should have regular mammograms. Talk with your health care provider about your test results, treatment options, and if necessary, the need for more tests. Vaccines  Your health  care provider may recommend certain vaccines, such as: Influenza vaccine. This is recommended every year. Tetanus,  diphtheria, and acellular pertussis (Tdap, Td) vaccine. You may need a Td booster every 10 years. Zoster vaccine. You may need this after age 31. Pneumococcal 13-valent conjugate (PCV13) vaccine. One dose is recommended after age 84. Pneumococcal polysaccharide (PPSV23) vaccine. One dose is recommended after age 97. Talk to your health care provider about which screenings and vaccines you need and how often you need them. This information is not intended to replace advice given to you by your health care provider. Make sure you discuss any questions you have with your health care provider. Document Released: 12/13/2015 Document Revised: 08/05/2016 Document Reviewed: 09/17/2015 Elsevier Interactive Patient Education  2017 [place] Prevention in the Home Falls can cause injuries. They can happen to people of all ages. There are many things you can do to make your home safe and to help prevent falls. What can I do on the outside of my home? Regularly fix the edges of walkways and driveways and fix any cracks. Remove anything that might make you trip as you walk through a door, such as a raised step or threshold. Trim any bushes or trees on the path to your home. Use bright outdoor lighting. Clear any walking paths of anything that might make someone trip, such as rocks or tools. Regularly check to see if handrails are loose or broken. Make sure that both sides of any steps have handrails. Any raised decks and porches should have guardrails on the edges. Have any leaves, snow, or ice cleared regularly. Use sand or salt on walking paths during winter. Clean up any spills in your garage right away. This includes oil or grease spills. What can I do in the bathroom? Use night lights. Install grab bars by the toilet and in the tub and shower. Do not use towel bars as grab bars. Use non-skid mats or decals in the tub or shower. If you need to sit down in the shower, use a plastic,  non-slip stool. Keep the floor dry. Clean up any water that spills on the floor as soon as it happens. Remove soap buildup in the tub or shower regularly. Attach bath mats securely with double-sided non-slip rug tape. Do not have throw rugs and other things on the floor that can make you trip. What can I do in the bedroom? Use night lights. Make sure that you have a light by your bed that is easy to reach. Do not use any sheets or blankets that are too big for your bed. They should not hang down onto the floor. Have a firm chair that has side arms. You can use this for support while you get dressed. Do not have throw rugs and other things on the floor that can make you trip. What can I do in the kitchen? Clean up any spills right away. Avoid walking on wet floors. Keep items that you use a lot in easy-to-reach places. If you need to reach something above you, use a strong step stool that has a grab bar. Keep electrical cords out of the way. Do not use floor polish or wax that makes floors slippery. If you must use wax, use non-skid floor wax. Do not have throw rugs and other things on the floor that can make you trip. What can I do with my stairs? Do not leave any items on the stairs. Make sure that there are handrails on  both sides of the stairs and use them. Fix handrails that are broken or loose. Make sure that handrails are as long as the stairways. Check any carpeting to make sure that it is firmly attached to the stairs. Fix any carpet that is loose or worn. Avoid having throw rugs at the top or bottom of the stairs. If you do have throw rugs, attach them to the floor with carpet tape. Make sure that you have a light switch at the top of the stairs and the bottom of the stairs. If you do not have them, ask someone to add them for you. What else can I do to help prevent falls? Wear shoes that: Do not have high heels. Have rubber bottoms. Are comfortable and fit you well. Are closed  at the toe. Do not wear sandals. If you use a stepladder: Make sure that it is fully opened. Do not climb a closed stepladder. Make sure that both sides of the stepladder are locked into place. Ask someone to hold it for you, if possible. Clearly mark and make sure that you can see: Any grab bars or handrails. First and last steps. Where the edge of each step is. Use tools that help you move around (mobility aids) if they are needed. These include: Canes. Walkers. Scooters. Crutches. Turn on the lights when you go into a dark area. Replace any light bulbs as soon as they burn out. Set up your furniture so you have a clear path. Avoid moving your furniture around. If any of your floors are uneven, fix them. If there are any pets around you, be aware of where they are. Review your medicines with your doctor. Some medicines can make you feel dizzy. This can increase your chance of falling. Ask your doctor what other things that you can do to help prevent falls. This information is not intended to replace advice given to you by your health care provider. Make sure you discuss any questions you have with your health care provider. Document Released: 09/12/2009 Document Revised: 04/23/2016 Document Reviewed: 12/21/2014 Elsevier Interactive Patient Education  2017 [employer].

## 2022-05-07 NOTE — Progress Notes (Signed)
Virtual Visit via Telephone Note  I connected with  Elizabeth Douglas on 05/07/22 at  9:30 AM EDT by telephone and verified that I am speaking with the correct person using two identifiers.  Medicare Annual Wellness visit completed telephonically due to Covid-19 pandemic.   Persons participating in this call: This Health Coach and this patient.   Location: Patient: Home Provider: Office   I discussed the limitations, risks, security and privacy concerns of performing an evaluation and management service by telephone and the availability of in person appointments. The patient expressed understanding and agreed to proceed.  Unable to perform video visit due to video visit attempted and failed and/or patient does not have video capability.   Some vital signs may be absent or patient reported.   Willette Brace, LPN   Subjective:   Elizabeth Douglas is a 71 y.o. female who presents for Medicare Annual (Subsequent) preventive examination.  Review of Systems     Cardiac Risk Factors include: advanced age (>8mn, >>36women);dyslipidemia     Objective:    There were no vitals filed for this visit. There is no height or weight on file to calculate BMI.     05/07/2022    9:40 AM 05/01/2021    9:31 AM 01/28/2021   10:26 AM 07/03/2020   12:53 PM 07/03/2020    6:34 AM 06/24/2020   10:47 AM 12/29/2019   10:48 AM  Advanced Directives  Does Patient Have a Medical Advance Directive? No No Yes No No No Yes  Type of Advance Directive       [place] Does patient want to make changes to medical advance directive?       No - Patient declined  Would patient like information on creating a medical advance directive? No - Patient declined No - Patient declined  No - Patient declined No - Patient declined No - Patient declined     Current Medications (verified) Outpatient Encounter Medications as of 05/07/2022  Medication Sig   acetaminophen (TYLENOL) 500 MG tablet Take 1,000 mg by mouth  at bedtime as needed for moderate pain.   aspirin EC 81 MG tablet Take 81 mg by mouth daily. Swallow whole.   atorvastatin (LIPITOR) 10 MG tablet Take 1 tablet (10 mg total) by mouth once a week.   Carboxymethylcellul-Glycerin (LUBRICATING EYE DROPS OP) Place 1 drop into both eyes daily.   Cholecalciferol (VITAMIN D) 50 MCG (2000 UT) tablet Take 2,000 Units by mouth daily.   omeprazole (PRILOSEC) 20 MG capsule Take 1 capsule (20 mg total) by mouth daily.   Wheat Dextrin (BENEFIBER) POWD Take 1 Dose by mouth daily.    No facility-administered encounter medications on file as of 05/07/2022.    Allergies (verified) Fish allergy, Rosuvastatin, and Iodixanol   History: Past Medical History:  Diagnosis Date   Anal fissure    Arthritis    hip   Atypical chest pain    Basal cell carcinoma    Skin cancer face   Complication of anesthesia    BP drops and passes out  [place] vomiting   DVT (deep venous thrombosis) (HCC)    Right Leg   Excess or deficiency of vitamin D    Gallbladder polyp    GERD (gastroesophageal reflux disease)    Hemorrhoids    Microhematuria    pt. denies   Osteopenia    Peripheral vascular disease (HCC)    PONV (postoperative nausea and vomiting)  Thrombocytopenia [facility])    Past Surgical History:  Procedure Laterality Date   ABDOMINAL AORTOGRAM W/LOWER EXTREMITY Right 12/22/2018   Procedure: ABDOMINAL AORTOGRAM W/LOWER EXTREMITY [place];  Surgeon: Waynetta Sandy, MD;  Location: [place] [facility];  Service: Cardiovascular;  Laterality: Right;   COLONOSCOPY  2005, 2015   INTRAOPERATIVE ARTERIOGRAM Right 11/29/2015   Procedure: INTRA OPERATIVE ARTERIOGRAM;  Surgeon: Conrad Winslow West, MD;  Location: [place];  Service: Vascular;  Laterality: Right;   JOINT REPLACEMENT     right total hip [place] 02-16-18   LUNG BIOPSY     THROMBECTOMY FEMORAL ARTERY Right 11/29/2015   Procedure:  RIGHT  POPLITEAL to anterior tibial and posterior tibial ARTERY thrombectomy.  Right above knee to below knee popliteal artery bypass;  Surgeon: Conrad Guinda, MD;  Location: [place];  Service: Vascular;  Laterality: Right;   TOTAL HIP ARTHROPLASTY Right 02/16/2018   Procedure: RIGHT TOTAL HIP ARTHROPLASTY ANTERIOR APPROACH;  Surgeon: Gaynelle Arabian, MD;  Location: [facility];  Service: Orthopedics;  Laterality: Right;   TOTAL HIP ARTHROPLASTY Left 07/03/2020   Procedure: TOTAL HIP ARTHROPLASTY ANTERIOR APPROACH;  Surgeon: Gaynelle Arabian, MD;  Location: [facility];  Service: Orthopedics;  Laterality: Left;  141mn   UPPER GASTROINTESTINAL ENDOSCOPY  2009   Family History  Problem Relation Age of Onset   COPD Mother    Osteoporosis Mother    Prostate cancer Father    Heart disease Brother 433      Before age 69  Heart attack Brother 452      Massive   Lymphoma Brother        non and hodgkins   Coronary artery disease Brother    Colon cancer Neg Hx    Colon polyps Neg Hx    Rectal cancer Neg Hx    Stomach cancer Neg Hx    Social History   Socioeconomic History   Marital status: Married    Spouse name: Not on file   Number of children: 1   Years of education: Not on file   Highest education level: Not on file  Occupational History   Occupation: Retired  Tobacco Use   Smoking status: Never   Smokeless tobacco: Never  Vaping Use   Vaping Use: Never used  Substance and Sexual Activity   Alcohol use: No    Alcohol/week: 0.0 standard drinks of alcohol   Drug use: No   Sexual activity: Yes    Birth control/protection: Post-menopausal  Other Topics Concern   Not on file  Social History Narrative   Married. 1 daughter. No grandkids. 1 granddog and 1 grandcat.       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       Hobbies: walking 2.5 miles  [place] stays active   Social Determinants of Health   Financial Resource Strain: Low Risk  (05/07/2022)   Overall Financial Resource Strain (CARDIA)    Difficulty of Paying  Living Expenses: Not hard at all  Food Insecurity: No Food Insecurity (05/07/2022)   Hunger Vital Sign    Worried About Running Out of Food in the Last Year: Never true    Ran Out of Food in the Last Year: Never true  Transportation Needs: No Transportation Needs (05/07/2022)   PRAPARE - THydrologist(Medical): No    Lack of Transportation (Non-Medical): No  Physical Activity: Sufficiently Active (05/07/2022)   Exercise  Vital Sign    Days of Exercise per Week: 5 days    Minutes of Exercise per Session: 60 min  Stress: No Stress Concern Present (05/07/2022)   [place]    Feeling of Stress : Not at all  Social Connections: Moderately Integrated (05/07/2022)   Social Connection and Isolation Panel [NHANES]    Frequency of Communication with Friends and Family: More than three times a week    Frequency of Social Gatherings with Friends and Family: More than three times a week    Attends Religious Services: More than 4 times per year    Active Member of [employer] or Organizations: No    Attends Music therapist: Never    Marital Status: Married    Tobacco Counseling Counseling given: Not Answered   Clinical Intake:  Pre-visit preparation completed: Yes  Pain : No/denies pain     BMI - recorded: 22.05 Nutritional Status: BMI of 19-24  Normal Nutritional Risks: None Diabetes: No  How often do you need to have someone help you when you read instructions, pamphlets, or other written materials from your doctor or pharmacy?: 1 - Never  Diabetic?no  Interpreter Needed?: No  Information entered by :: Charlott Rakes, LPN   Activities of Daily Living    05/07/2022    9:41 AM  In your present state of health, do you have any difficulty performing the following activities:  Hearing? 0  Vision? 0  Difficulty concentrating or making decisions? 0  Walking or climbing stairs? 0   Dressing or bathing? 0  Doing errands, shopping? 0  Preparing Food and eating ? N  Using the Toilet? N  In the past six months, have you accidently leaked urine? N  Do you have problems with loss of bowel control? N  Managing your Medications? N  Managing your Finances? N  Housekeeping or managing your Housekeeping? N    Patient Care Team: Marin Olp, MD as PCP - General (Family Medicine) Jari Pigg, MD as Consulting Physician (Dermatology) Milus Banister, MD as Attending Physician (Gastroenterology) Waynetta Sandy, MD as Consulting Physician (Vascular Surgery) Gaynelle Arabian, MD as Consulting Physician (Orthopedic Surgery)  Indicate any recent Medical Services you may have received from other than [facility] providers in the past year (date may be approximate).     Assessment:   This is a routine wellness examination for Noha.  Hearing/Vision screen Hearing Screening - Comments:: Pt denies any hearing issues  Vision Screening - Comments:: Pt follows up with [facility] in [place]  Dietary issues and exercise activities discussed: Current Exercise Habits: Home exercise routine, Type of exercise: walking, Time (Minutes): 60, Frequency (Times/Week): 5, Weekly Exercise (Minutes/Week): 300   Goals Addressed             This Visit's Progress    Patient Stated       Continue to stay healthy       Depression Screen    05/07/2022    9:40 AM 05/06/2022    8:06 AM 05/01/2021    9:30 AM 04/29/2021    8:11 AM 04/25/2020    8:22 AM 02/13/2019    8:23 AM 01/06/2018   12:54 PM  PHQ 2/9 Scores  PHQ - 2 Score 0 0 0 0 0 0 0  PHQ- 9 Score  0  0       Fall Risk    05/07/2022    9:41 AM 12/05/2021  9:46 AM 05/01/2021    9:32 AM 04/29/2021    8:03 AM 04/25/2020    8:22 AM  Fall Risk   Falls in the past year? 0 0 0 0 0  Number falls in past yr: 0  0 0 0  Injury with Fall? 0  0  0  Risk for fall due to : Impaired vision No Fall Risks Impaired vision  No Fall Risks   Follow up Falls prevention discussed  Falls prevention discussed  Falls evaluation completed    FALL RISK PREVENTION PERTAINING TO THE HOME:  Any stairs in or around the home? Yes  If so, are there any without handrails? No  Home free of loose throw rugs in walkways, pet beds, electrical cords, etc? Yes  Adequate lighting in your home to reduce risk of falls? Yes   ASSISTIVE DEVICES UTILIZED TO PREVENT FALLS:  Life alert? No  Use of a cane, walker or w/c? No  Grab bars in the bathroom? No  Shower chair or bench in shower? No  Elevated toilet seat or a handicapped toilet? No   TIMED UP AND GO:  Was the test performed? No .  Length of time to ambulate 10 feet: 10 sec.   Gait steady and fast without use of assistive device  Cognitive Function:        05/07/2022    9:43 AM 05/01/2021    9:33 AM  6CIT Screen  What Year? 0 points 0 points  What month? 0 points 0 points  What time? 0 points 0 points  Count back from 20 0 points 0 points  Months in reverse 0 points 0 points  Repeat phrase 0 points 0 points  Total Score 0 points 0 points    Immunizations Immunization History  Administered Date(s) Administered   Influenza Whole 09/19/2007   Influenza, High Dose Seasonal PF 09/09/2018, 08/22/2019   Influenza,inj,Quad PF,6+ Mos 09/26/2013, 08/30/2014, 09/04/2015, 09/13/2017   Influenza,inj,quad, With Preservative 09/09/2018   Influenza-Unspecified 09/22/2017, 09/09/2018, 08/22/2019, 09/22/2021   Moderna SARS-COV2 Booster Vaccination 10/30/2020   Moderna Sars-Covid-2 Vaccination 01/03/2020, 02/01/2020   PFIZER(Purple Top)SARS-COV-2 Vaccination 10/30/2020   PNEUMOCOCCAL CONJUGATE-20 04/29/2021   Pneumococcal-Unspecified 03/31/2021   Td 05/12/2004   Tdap 04/22/2016   Zoster, Live 11/14/2014    TDAP status: Up to date  Flu Vaccine status: Up to date  Pneumococcal vaccine status: Up to date  Covid-19 vaccine status: Completed vaccines  Qualifies for Shingles Vaccine?  Yes   Zostavax completed No   Shingrix Completed?: No.    Education has been provided regarding the importance of this vaccine. Patient has been advised to call insurance company to determine out of pocket expense if they have not yet received this vaccine. Advised may also receive vaccine at local pharmacy or Health Dept. Verbalized acceptance and understanding.  Screening Tests Health Maintenance  Topic Date Due   COVID-19 Vaccine (4 - Booster) 05/22/2022 (Originally 12/25/2020)   Zoster Vaccines- Shingrix (1 of 2) 08/06/2022 (Originally 06/06/1970)   INFLUENZA VACCINE  06/30/2022   MAMMOGRAM  04/30/2023   COLONOSCOPY (Pts 45-34yr Insurance coverage will need to be confirmed)  01/09/2024   TETANUS/TDAP  04/22/2026   Pneumonia Vaccine 69 Years old  Completed   DEXA SCAN  Completed   Hepatitis C Screening  Completed   HPV VACCINES  Aged Out    Health Maintenance  There are no preventive care reminders to display for this patient.  Colorectal cancer screening: Type of screening: Colonoscopy. Completed 01/08/14. Repeat every  10 years  Mammogram status: Completed 04/29/22. Repeat every year  Bone Density status: Completed 05/16/20 scheduled for 06/09/22. Results reflect: Bone density results: OSTEOPENIA. Repeat every 2 years.   Additional Screening:  Hepatitis C Screening: Completed 04/29/17  Vision Screening: Recommended annual ophthalmology exams for early detection of glaucoma and other disorders of the eye. Is the patient up to date with their annual eye exam?  Yes  Who is the provider or what is the name of the office in which the patient attends annual eye exams? [facility]  If pt is not established with a provider, would they like to be referred to a provider to establish care? No .   Dental Screening: Recommended annual dental exams for proper oral hygiene  Community Resource Referral / Chronic Care Management: CRR required this visit?  No   CCM required this visit?  No       Plan:     I have personally reviewed and noted the following in the patient's chart:   Medical and social history Use of alcohol, tobacco or illicit drugs  Current medications and supplements including opioid prescriptions.  Functional ability and status Nutritional status Physical activity Advanced directives List of other physicians Hospitalizations, surgeries, and ER visits in previous 12 months Vitals Screenings to include cognitive, depression, and falls Referrals and appointments  In addition, I have reviewed and discussed with patient certain preventive protocols, quality metrics, and best practice recommendations. A written personalized care plan for preventive services as well as general preventive health recommendations were provided to patient.     Willette Brace, LPN   05/03/8755   Nurse Notes: None

## 2022-06-09 ENCOUNTER — Ambulatory Visit (INDEPENDENT_AMBULATORY_CARE_PROVIDER_SITE_OTHER)
Admission: RE | Admit: 2022-06-09 | Discharge: 2022-06-09 | Disposition: A | Discharge status: Home / Self Care | Payer: Medicare HMO | Source: Ambulatory Visit | Attending: Family Medicine | Admitting: Family Medicine

## 2022-06-09 DIAGNOSIS — M85852 Other specified disorders of bone density and structure, left thigh: Secondary | ICD-10-CM | POA: Diagnosis not present

## 2022-06-10 ENCOUNTER — Other Ambulatory Visit (HOSPITAL_COMMUNITY): Payer: Medicare HMO

## 2022-06-10 ENCOUNTER — Encounter: Payer: Self-pay | Admitting: Family Medicine

## 2022-06-15 ENCOUNTER — Ambulatory Visit (INDEPENDENT_AMBULATORY_CARE_PROVIDER_SITE_OTHER): Payer: Medicare HMO | Admitting: Family Medicine

## 2022-06-15 ENCOUNTER — Encounter: Payer: Self-pay | Admitting: Family Medicine

## 2022-06-15 VITALS — BP 110/72 | HR 65 | Temp 98.7°F | Ht 67.0 in | Wt 142.2 lb

## 2022-06-15 DIAGNOSIS — R350 Frequency of micturition: Secondary | ICD-10-CM

## 2022-06-15 DIAGNOSIS — R1032 Left lower quadrant pain: Secondary | ICD-10-CM | POA: Diagnosis not present

## 2022-06-15 LAB — POC URINALSYSI DIPSTICK (AUTOMATED)
Bilirubin, UA: NEGATIVE
Glucose, UA: NEGATIVE
Ketones, UA: NEGATIVE
Leukocytes, UA: NEGATIVE
Nitrite, UA: NEGATIVE
Protein, UA: NEGATIVE
Spec Grav, UA: 1.01 (ref 1.010–1.025)
Urobilinogen, UA: 0.2 E.U./dL
pH, UA: 6.5 (ref 5.0–8.0)

## 2022-06-15 NOTE — Progress Notes (Signed)
Phone [phone] In person visit   Subjective:   Elizabeth Douglas is a 71 y.o. year old very pleasant female patient who presents for/with See problem oriented charting Chief Complaint  Patient presents with   Abdominal Pain    Pt c/o llq pain that has been going on x2 months that has gotten worse. When she has the pain it feels like a kidney stone or uti.   Past Medical History-  Patient Active Problem List   Diagnosis Date Noted   Popliteal artery occlusion, right ([place]) 11/29/2015    Priority: High   Aortic atherosclerosis ([place]) 06/04/2021    Priority: Medium    Vitamin D deficiency 05/20/2018    Priority: Medium    History of skin cancer 04/22/2016    Priority: Medium    Hyperlipidemia 04/22/2016    Priority: Medium    Osteopenia of left femoral neck 09/18/2008    Priority: Medium    GERD 07/10/2008    Priority: Medium    Dupuytren's contracture of left hand 10/10/2019    Priority: Low   OA (osteoarthritis) of hip 02/16/2018    Priority: Low   Lumbar radiculopathy 11/20/2015    Priority: Low   Gluteal tendinitis of left buttock 11/14/2015    Priority: Low   Tensor fascia lata syndrome 10/08/2015    Priority: Low   Hemorrhoids     Priority: Low   Thrombosed external hemorrhoid 10/19/2014    Priority: Low   Anal fissure - posterior 01/29/2014    Priority: Low   CHEST PAIN 01/10/2009    Priority: Low   BENIGN POSITIONAL VERTIGO 01/18/2008    Priority: Low   Neuropathy of finger 12/05/2021   Primary osteoarthritis of left hip 07/03/2020    Medications- reviewed and updated Current Outpatient Medications  Medication Sig Dispense Refill   acetaminophen (TYLENOL) 500 MG tablet Take 1,000 mg by mouth at bedtime as needed for moderate pain.     aspirin EC 81 MG tablet Take 81 mg by mouth daily. Swallow whole.     atorvastatin (LIPITOR) 10 MG tablet Take 1 tablet (10 mg total) by mouth once a week. 13 tablet 3   Carboxymethylcellul-Glycerin (LUBRICATING EYE DROPS  OP) Place 1 drop into both eyes daily.     Cholecalciferol (VITAMIN D) 50 MCG (2000 UT) tablet Take 2,000 Units by mouth daily.     omeprazole (PRILOSEC) 20 MG capsule Take 1 capsule (20 mg total) by mouth daily. 90 capsule 3   Wheat Dextrin (BENEFIBER) POWD Take 1 Dose by mouth daily.      No current facility-administered medications for this visit.     Objective:  BP 110/72   Pulse 65   Temp 98.7 F (37.1 C)   Ht '5\' 7"'$  (1.702 m)   Wt 142 lb 3.2 oz (64.5 kg)   SpO2 98%   BMI 22.27 kg/m  Gen: NAD, resting comfortably CV: RRR no murmurs rubs or gallops Lungs: CTAB no crackles, wheeze, rhonchi Abdomen: soft/nontender/nondistended/normal bowel sounds. No rebound or guarding.  Ext: no edema Skin: warm, dry 2+ PT and PT pulses    Assessment and Plan   #Left lower quadrant abdominal pain S: Patient has been having issues for approximately 2 months but has been worsening in frequency (# of days  per week and # of times in the day) but not in intensity. At its worst up to 3/10 but high pain threshold typically. She is concerned about UTI or kidney stone. GYN evaluated in her  may (before our visit). No vaginal  bleeding or discharge. No fevers. Pain LLQ and into left groin. Tylenol helped some yesterday. No back pain. Sometimes even more suprapubic. Possible increased urinary frequency. No dysuria. No significant urgency. No hematuria. Yesterday bothered her pretty much all day long.  Not related to exertion  Not reported specifically at physical but did have some myalgias reported and we were holding statin at that time to see if that helped-feeling antisocial, achy on days of statin and improved on 5 mg weekly before that visit). Basically- She states was bothering her but was so off and on didn't mention.  A/P: Left lower quadrant pain of uncertain etiology-increasing in frequency of days per week as well as frequency each day.  Not exertional and good pulses-doubt arterial issue despite  PAD history.  No increased leg edema or calf pain-doubt DVT with extension. -Check urinalysis if any blood consider noncontrast CT (patient already breast on).  No stones noted on prior abdominal films -Order urine culture to rule out UTI -Update CBC and CMP as well (doubt smoldering diverticulitis with possible, doubt renal dysfunction, doubt significantly elevated white count but could alter decision making) -If the above are unrevealing consider CT abdomen pelvis with contrast   Recommended follow up: Return for next already scheduled visit or sooner if needed. Future Appointments  Date Time Provider [place]  05/10/2023  9:30 AM [facility] HEALTH COACH [facility]  06/07/2023  8:00 AM Marin Olp, MD [facility]   Lab/Order associations:   ICD-10-CM   1. Urinary frequency  R35.0 POCT Urinalysis Dipstick (Automated)    Urine Culture    2. LLQ pain  R10.32 POCT Urinalysis Dipstick (Automated)    Urine Culture    CBC with Differential/Platelet    Comprehensive metabolic panel      Time Spent: 24 minutes of total time (3:20 PM- 3:44 PM) was spent on the date of the encounter performing the following actions: chart review prior to seeing the patient, obtaining history, performing a medically necessary exam, counseling on the planned work-up and potential subsequent treatment, placing orders, and documenting in our EHR.    Return precautions advised.  Garret Reddish, MD

## 2022-06-15 NOTE — Patient Instructions (Addendum)
Rule out urinary tract infection  Please stop by lab before you go If you have mychart- we will send your results within 3 business days of [place] receiving them.  If you do not have mychart- we will call you about results within 5 business days of [place] receiving them.  *please also note that you will see labs on mychart as soon as they post. I will later go in and write notes on them- will say "notes from Dr. Yong Channel"   If this workup unrevealing by time we get culture back in a few days consider CT scan to evaluate for stones or other pathology  Recommended follow up: Return for next already scheduled visit or sooner if needed. Such as if this does not improve- seek care if symptoms worsen

## 2022-06-16 LAB — COMPREHENSIVE METABOLIC PANEL
ALT: 9 U/L (ref 0–35)
AST: 21 U/L (ref 0–37)
Albumin: 4.3 g/dL (ref 3.5–5.2)
Alkaline Phosphatase: 50 U/L (ref 39–117)
BUN: 13 mg/dL (ref 6–23)
CO2: 30 mEq/L (ref 19–32)
Calcium: 9.3 mg/dL (ref 8.4–10.5)
Chloride: 103 mEq/L (ref 96–112)
Creatinine, Ser: 0.63 mg/dL (ref 0.40–1.20)
GFR: 90.76 mL/min (ref >60.00)
Glucose, Bld: 86 mg/dL (ref 70–99)
Potassium: 4 mEq/L (ref 3.5–5.1)
Sodium: 140 mEq/L (ref 135–145)
Total Bilirubin: 0.3 mg/dL (ref 0.2–1.2)
Total Protein: 6.9 g/dL (ref 6.0–8.3)

## 2022-06-16 LAB — CBC WITH DIFFERENTIAL/PLATELET
Basophils Absolute: 0 10*3/uL (ref 0.0–0.1)
Basophils Relative: 0.9 % (ref 0.0–3.0)
Eosinophils Absolute: 0.1 10*3/uL (ref 0.0–0.7)
Eosinophils Relative: 1.7 % (ref 0.0–5.0)
HCT: 41.6 % (ref 36.0–46.0)
Hemoglobin: 14.1 g/dL (ref 12.0–15.0)
Lymphocytes Relative: 40 % (ref 12.0–46.0)
Lymphs Abs: 2.1 10*3/uL (ref 0.7–4.0)
MCHC: 33.8 g/dL (ref 30.0–36.0)
MCV: 94.8 fl (ref 78.0–100.0)
Monocytes Absolute: 0.2 10*3/uL (ref 0.1–1.0)
Monocytes Relative: 4.3 % (ref 3.0–12.0)
Neutro Abs: 2.8 10*3/uL (ref 1.4–7.7)
Neutrophils Relative %: 53.1 % (ref 43.0–77.0)
Platelets: 168 10*3/uL (ref 150.0–400.0)
RBC: 4.39 Mil/uL (ref 3.87–5.11)
RDW: 12.8 % (ref 11.5–15.5)
WBC: 5.3 10*3/uL (ref 4.0–10.5)

## 2022-06-17 ENCOUNTER — Other Ambulatory Visit: Payer: Self-pay | Admitting: Family Medicine

## 2022-06-17 LAB — URINE CULTURE
MICRO NUMBER:: 13655738
SPECIMEN QUALITY:: ADEQUATE

## 2022-06-17 MED ORDER — CEPHALEXIN 500 MG PO CAPS: 500.0000 mg | ORAL_CAPSULE | Freq: Three times a day (TID) | ORAL | 0 refills | Status: AC

## 2022-07-16 ENCOUNTER — Encounter: Payer: Self-pay | Admitting: Family Medicine

## 2022-07-17 ENCOUNTER — Other Ambulatory Visit: Payer: Self-pay

## 2022-07-17 ENCOUNTER — Other Ambulatory Visit: Payer: Self-pay | Admitting: Family Medicine

## 2022-07-17 ENCOUNTER — Other Ambulatory Visit: Payer: Medicare HMO

## 2022-07-17 DIAGNOSIS — R350 Frequency of micturition: Secondary | ICD-10-CM

## 2022-07-17 DIAGNOSIS — H5203 Hypermetropia, bilateral: Secondary | ICD-10-CM | POA: Diagnosis not present

## 2022-07-19 LAB — URINE CULTURE
MICRO NUMBER:: 13800082
SPECIMEN QUALITY:: ADEQUATE

## 2022-07-21 ENCOUNTER — Encounter: Payer: Self-pay | Admitting: Family Medicine

## 2022-08-11 DIAGNOSIS — Z0100 Encounter for examination of eyes and vision without abnormal findings: Secondary | ICD-10-CM | POA: Diagnosis not present

## 2022-08-24 ENCOUNTER — Encounter: Payer: Self-pay | Admitting: *Deleted

## 2022-08-30 ENCOUNTER — Other Ambulatory Visit: Payer: Self-pay | Admitting: Family Medicine

## 2022-09-04 DIAGNOSIS — L820 Inflamed seborrheic keratosis: Secondary | ICD-10-CM | POA: Diagnosis not present

## 2022-09-04 DIAGNOSIS — L821 Other seborrheic keratosis: Secondary | ICD-10-CM | POA: Diagnosis not present

## 2022-09-04 DIAGNOSIS — L570 Actinic keratosis: Secondary | ICD-10-CM | POA: Diagnosis not present

## 2022-09-04 DIAGNOSIS — D485 Neoplasm of uncertain behavior of skin: Secondary | ICD-10-CM | POA: Diagnosis not present

## 2022-11-12 DIAGNOSIS — N898 Other specified noninflammatory disorders of vagina: Secondary | ICD-10-CM | POA: Diagnosis not present

## 2022-11-12 DIAGNOSIS — N9411 Superficial (introital) dyspareunia: Secondary | ICD-10-CM | POA: Diagnosis not present

## 2022-11-12 DIAGNOSIS — N952 Postmenopausal atrophic vaginitis: Secondary | ICD-10-CM | POA: Diagnosis not present

## 2022-12-18 ENCOUNTER — Telehealth: Payer: Self-pay | Admitting: *Deleted

## 2022-12-18 ENCOUNTER — Encounter: Payer: Self-pay | Admitting: *Deleted

## 2022-12-18 NOTE — Patient Instructions (Signed)
Visit Information  Thank you for taking time to visit with me today. Please don't hesitate to contact me if I can be of assistance to you.   Following are the goals we discussed today:   Goals Addressed             This Visit's Progress    COMPLETED: care coordination activity       Care Coordination Interventions: Reviewed medications with patient and discussed adherence with no needed refills Reviewed scheduled/upcoming provider appointments including sufficient transportation Screening for signs and symptoms of depression related to chronic disease state  Assessed social determinant of health barriers Educated on care management services with no reported needs at this time.         Please call the care guide team at [phone] if you need to cancel or reschedule your appointment.   If you are experiencing a Mental Health or [place] or need someone to talk to, please call the Suicide and Crisis Lifeline: 988  Patient verbalizes understanding of instructions and care plan provided today and agrees to view in [place]. Active MyChart status and patient understanding of how to access instructions and care plan via MyChart confirmed with patient.     No further follow up required: No needs  Raina Mina, RN Care Management Coordinator [place] Office [phone]

## 2022-12-18 NOTE — Patient Outreach (Signed)
  Care Coordination   Initial Visit Note   12/18/2022 Name: BREZLYN MANRIQUE MRN: [id number] DOB: 04-26-1951  DUNYA MEINERS is a 72 y.o. year old female who sees Yong Channel, Brayton Mars, MD for primary care. I spoke with  [name] by phone today.  What matters to the patients health and wellness today?  No needs    Goals Addressed             This Visit's Progress    COMPLETED: care coordination activity       Care Coordination Interventions: Reviewed medications with patient and discussed adherence with no needed refills Reviewed scheduled/upcoming provider appointments including sufficient transportation Screening for signs and symptoms of depression related to chronic disease state  Assessed social determinant of health barriers Educated on care management services with no reported needs at this time.         SDOH assessments and interventions completed:  Yes  SDOH Interventions Today    Flowsheet Row Most Recent Value  SDOH Interventions   Food Insecurity Interventions Intervention Not Indicated  Housing Interventions Intervention Not Indicated  Transportation Interventions Intervention Not Indicated  Utilities Interventions Intervention Not Indicated        Care Coordination Interventions:  Yes, provided   Follow up plan: No further intervention required.   Encounter Outcome:  Pt. Visit Completed   Raina Mina, RN Care Management Coordinator [place] Office [phone]

## 2023-01-07 ENCOUNTER — Other Ambulatory Visit: Payer: Self-pay | Admitting: *Deleted

## 2023-01-07 DIAGNOSIS — Z95828 Presence of other vascular implants and grafts: Secondary | ICD-10-CM

## 2023-01-07 DIAGNOSIS — I739 Peripheral vascular disease, unspecified: Secondary | ICD-10-CM

## 2023-01-27 ENCOUNTER — Ambulatory Visit (HOSPITAL_COMMUNITY)
Admission: RE | Admit: 2023-01-27 | Discharge: 2023-01-27 | Disposition: A | Discharge status: Home / Self Care | Payer: Medicare HMO | Source: Ambulatory Visit | Attending: Vascular Surgery | Admitting: Vascular Surgery

## 2023-01-27 ENCOUNTER — Ambulatory Visit (INDEPENDENT_AMBULATORY_CARE_PROVIDER_SITE_OTHER)
Admission: RE | Admit: 2023-01-27 | Discharge: 2023-01-27 | Disposition: A | Discharge status: Home / Self Care | Payer: Medicare HMO | Source: Ambulatory Visit | Attending: Vascular Surgery | Admitting: Vascular Surgery

## 2023-01-27 ENCOUNTER — Ambulatory Visit (INDEPENDENT_AMBULATORY_CARE_PROVIDER_SITE_OTHER): Payer: Medicare HMO | Admitting: Physician Assistant

## 2023-01-27 VITALS — BP 122/69 | HR 79 | Temp 97.9°F | Resp 20 | Ht 67.0 in | Wt 135.0 lb

## 2023-01-27 DIAGNOSIS — I739 Peripheral vascular disease, unspecified: Secondary | ICD-10-CM | POA: Insufficient documentation

## 2023-01-27 DIAGNOSIS — Z95828 Presence of other vascular implants and grafts: Secondary | ICD-10-CM | POA: Diagnosis not present

## 2023-01-27 LAB — VAS US ABI WITH/WO TBI
Left ABI: 1.22
Right ABI: 1.18

## 2023-01-27 NOTE — Progress Notes (Signed)
HISTORY AND PHYSICAL     CC:  follow up. Requesting Provider:  Marin Olp, MD  HPI: This is a 72 y.o. female who is here today for follow up for PAD.  Pt has hx of  right above-the-knee to below-knee popliteal artery bypass with GSV for acute limb ischemia with Dr. Bridgett Larsson in 2015.   In 2020, she had decreased velocities in her right leg bypass graft that can be indicative of impending graft failure and she underwent angiogram and bypass was patent without flow limitation.    Pt was last seen 02/11/2022 and at that time, she was doing well without claudication.  She was walking more than 2 miles a day.  She had on and off numbness of the right leg for more than a year with hx of lumbar DDD.  She was compliant with asa/statin.  She had palpable bilateral pedal pulses.    The pt returns today for follow up and here with her husband.  She states that she did develop a pain in the right lower leg around the lateral side and the calf for a couple of days but this improved and has completely resolved.  She does not have any claudication, rest pain or non healing wounds.    The pt is on a statin for cholesterol management.    The pt is on an aspirin.    Other AC:  none The pt is not on medication for hypertension.  The pt does not have diabetes. Tobacco hx:  never  Pt does not have family hx of AAA.  Past Medical History:  Diagnosis Date   Anal fissure    Arthritis    hip   Atypical chest pain    Basal cell carcinoma    Skin cancer face   Complication of anesthesia    BP drops and passes out  [place] vomiting   DVT (deep venous thrombosis) (HCC)    Right Leg   Excess or deficiency of vitamin D    Gallbladder polyp    GERD (gastroesophageal reflux disease)    Hemorrhoids    Microhematuria    pt. denies   Osteopenia    Peripheral vascular disease (HCC)    PONV (postoperative nausea and vomiting)    Thrombocytopenia ([place])     Past Surgical History:  Procedure Laterality Date    ABDOMINAL AORTOGRAM W/LOWER EXTREMITY Right 12/22/2018   Procedure: ABDOMINAL AORTOGRAM W/LOWER EXTREMITY [place];  Surgeon: Waynetta Sandy, MD;  Location: [facility];  Service: Cardiovascular;  Laterality: Right;   COLONOSCOPY  2005, 2015   INTRAOPERATIVE ARTERIOGRAM Right 11/29/2015   Procedure: INTRA OPERATIVE ARTERIOGRAM;  Surgeon: Conrad Morrilton, MD;  Location: [facility];  Service: Vascular;  Laterality: Right;   JOINT REPLACEMENT     right total hip [place] 02-16-18   LUNG BIOPSY     THROMBECTOMY FEMORAL ARTERY Right 11/29/2015   Procedure:  RIGHT  POPLITEAL to anterior tibial and posterior tibial ARTERY thrombectomy. Right above knee to below knee popliteal artery bypass;  Surgeon: Conrad Mount Carmel, MD;  Location: [facility];  Service: Vascular;  Laterality: Right;   TOTAL HIP ARTHROPLASTY Right 02/16/2018   Procedure: RIGHT TOTAL HIP ARTHROPLASTY ANTERIOR APPROACH;  Surgeon: Gaynelle Arabian, MD;  Location: [facility];  Service: Orthopedics;  Laterality: Right;   TOTAL HIP ARTHROPLASTY Left 07/03/2020   Procedure: TOTAL HIP ARTHROPLASTY ANTERIOR APPROACH;  Surgeon: Gaynelle Arabian, MD;  Location: [facility];  Service: Orthopedics;  Laterality: Left;  119mn  UPPER GASTROINTESTINAL ENDOSCOPY  2009    Allergies  Allergen Reactions   Fish Allergy Anaphylaxis and Swelling    Pt felt like throat was closing last time she ate    Rosuvastatin     Myalgias on 10 mg weekly Even on 5 mg weekly- doesn't feel like herself/more antisocial    Iodixanol Rash    Contrast Dye     Current Outpatient Medications  Medication Sig Dispense Refill   acetaminophen (TYLENOL) 500 MG tablet Take 1,000 mg by mouth at bedtime as needed for moderate pain.     aspirin EC 81 MG tablet Take 81 mg by mouth daily. Swallow whole.     atorvastatin (LIPITOR) 10 MG tablet Take 1 tablet (10 mg total) by mouth once a week. 13 tablet 3   Carboxymethylcellul-Glycerin (LUBRICATING EYE DROPS OP) Place 1 drop into both eyes  daily.     Cholecalciferol (VITAMIN D) 50 MCG (2000 UT) tablet Take 2,000 Units by mouth daily.     omeprazole (PRILOSEC) 20 MG capsule TAKE 1 CAPSULE BY MOUTH EVERY DAY 90 capsule 3   Wheat Dextrin (BENEFIBER) POWD Take 1 Dose by mouth daily.      No current facility-administered medications for this visit.    Family History  Problem Relation Age of Onset   COPD Mother    Osteoporosis Mother    Prostate cancer Father    Heart disease Brother 85       Before age 66   Heart attack Brother 80       Massive   Lymphoma Brother        non and hodgkins   Coronary artery disease Brother    Colon cancer Neg Hx    Colon polyps Neg Hx    Rectal cancer Neg Hx    Stomach cancer Neg Hx     Social History   Socioeconomic History   Marital status: Married    Spouse name: Not on file   Number of children: 1   Years of education: Not on file   Highest education level: Not on file  Occupational History   Occupation: Retired  Tobacco Use   Smoking status: Never   Smokeless tobacco: Never  Vaping Use   Vaping Use: Never used  Substance and Sexual Activity   Alcohol use: No    Alcohol/week: 0.0 standard drinks of alcohol   Drug use: No   Sexual activity: Yes    Birth control/protection: Post-menopausal  Other Topics Concern   Not on file  Social History Narrative   Married. 1 daughter. No grandkids. 1 granddog and 1 grandcat.       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       Hobbies: walking 2.5 miles  [place], stays active   Social Determinants of Health   Financial Resource Strain: Low Risk  (05/07/2022)   Overall Financial Resource Strain (CARDIA)    Difficulty of Paying Living Expenses: Not hard at all  Food Insecurity: No Food Insecurity (12/18/2022)   Hunger Vital Sign    Worried About Running Out of Food in the Last Year: Never true    Ran Out of Food in the Last Year: Never true  Transportation Needs: No  Transportation Needs (12/18/2022)   PRAPARE - Hydrologist (Medical): No    Lack of Transportation (Non-Medical): No  Physical Activity: Sufficiently Active (05/07/2022)   Exercise Vital Sign  Days of Exercise per Week: 5 days    Minutes of Exercise per Session: 60 min  Stress: No Stress Concern Present (05/07/2022)   [place]    Feeling of Stress : Not at all  Social Connections: Moderately Integrated (05/07/2022)   Social Connection and Isolation Panel [NHANES]    Frequency of Communication with Friends and Family: More than three times a week    Frequency of Social Gatherings with Friends and Family: More than three times a week    Attends Religious Services: More than 4 times per year    Active Member of [employer] or Organizations: No    Attends Archivist Meetings: Never    Marital Status: Married  Human resources officer Violence: Not At Risk (05/07/2022)   Humiliation, Afraid, Rape, and Kick questionnaire    Fear of Current or Ex-Partner: No    Emotionally Abused: No    Physically Abused: No    Sexually Abused: No     REVIEW OF SYSTEMS:   '[X]'$  denotes positive finding, '[ ]'$  denotes negative finding Cardiac  Comments:  Chest pain or chest pressure:    Shortness of breath upon exertion:    Short of breath when lying flat:    Irregular heart rhythm:        Vascular    Pain in calf, thigh, or hip brought on by ambulation:    Pain in feet at night that wakes you up from your sleep:     Blood clot in your veins:    Leg swelling:         Pulmonary    Oxygen at home:    Productive cough:     Wheezing:         Neurologic    Sudden weakness in arms or legs:     Sudden numbness in arms or legs:     Sudden onset of difficulty speaking or slurred speech:    Temporary loss of vision in one eye:     Problems with dizziness:         Gastrointestinal    Blood in stool:     Vomited  blood:         Genitourinary    Burning when urinating:     Blood in urine:        Psychiatric    Major depression:         Hematologic    Bleeding problems:    Problems with blood clotting too easily:        Skin    Rashes or ulcers:        Constitutional    Fever or chills:      PHYSICAL EXAMINATION:  Today's Vitals   01/27/23 1229  BP: 122/69  Pulse: 79  Resp: 20  Temp: 97.9 F (36.6 C)  TempSrc: Temporal  SpO2: 98%  Weight: 135 lb (61.2 kg)  Height: '5\' 7"'$  (1.702 m)   Body mass index is 21.14 kg/m.   General:  WDWN in NAD; vital signs documented above Gait: Not observed HENT: WNL, normocephalic Pulmonary: normal non-labored breathing , without wheezing Cardiac: regular HR, without carotid bruits Abdomen: soft, NT; aortic pulse is not palpable Skin: without rashes Vascular Exam/Pulses: Bilateral femoral, DP and radial pulses are palpable Extremities: without ischemic changes, without Gangrene , without cellulitis; without open wounds Musculoskeletal: no muscle wasting or atrophy  Neurologic: A&O X 3 Psychiatric:  The pt has Normal affect.  Non-Invasive Vascular Imaging:   ABI's/TBI's on 01/27/2023: Right:  1.18/0.77 - Great toe pressure: 89 Left:  1.22/0.54 - Great toe pressure: 63  Arterial duplex on 01/27/2023: Right Graft #1: Right AK popliteal to BK popliteal artery bypass  +------------------+--------+--------+---------+--------+                   PSV cm/sStenosisWaveform Comments  +------------------+--------+--------+---------+--------+  Inflow           70              triphasic          +------------------+--------+--------+---------+--------+  Prox Anastomosis  53              triphasic          +------------------+--------+--------+---------+--------+  Proximal Graft    55              triphasic          +------------------+--------+--------+---------+--------+  Mid Graft         27              triphasic           +------------------+--------+--------+---------+--------+  Distal Graft      35              triphasic          +------------------+--------+--------+---------+--------+  Distal Anastomosis83              triphasic          +------------------+--------+--------+---------+--------+  Outflow          63              triphasic          +------------------+--------+--------+---------+--------+   Summary:  Right: Patent RT above knee popliteal to below knee popliteal artery bypass graft. Low velocities in the graft could suggest a threatened bypass.   Previous ABI's/TBI's on 02/11/2022: Right:  1.20/0.84 - Great toe pressure: 98 Left:  1.27/0.62 - Great toe pressure:  73  Previous arterial duplex on 02/11/2022: +--------+--------+-----+--------+--------+--------+  RIGHT  PSV cm/sRatioStenosisWaveformComments  +--------+--------+-----+--------+--------+--------+  SFA Prox84                   biphasic          +--------+--------+-----+--------+--------+--------+  SFA Mid 67                   biphasic          +--------+--------+-----+--------+--------+--------+   Right Graft #1: Above knee to below knee popliteal  +------------------+--------+--------+----------+--------+                   PSV cm/sStenosisWaveform  Comments  +------------------+--------+--------+----------+--------+  Inflow           59              triphasic           +------------------+--------+--------+----------+--------+  Prox Anastomosis  67              biphasic            +------------------+--------+--------+----------+--------+  Proximal Graft    69              monophasic          +------------------+--------+--------+----------+--------+  Mid Graft         19              monophasic          +------------------+--------+--------+----------+--------+  Distal Graft  65              triphasic            +------------------+--------+--------+----------+--------+  Distal Anastomosis36              biphasic            +------------------+--------+--------+----------+--------+  Outflow          40              biphasic            +------------------+--------+--------+----------+--------+   Dilated segment of graft measures 1.28 cm x 1.75 cm.     ASSESSMENT/PLAN:: 72 y.o. female here for follow up for PAD with hx of right above-the-knee to below-knee popliteal artery bypass for acute limb ischemia with Dr. Bridgett Larsson in 2015.   In 2020, she had decreased velocities in her right leg bypass graft that can be indicative of impending graft failure and she underwent angiogram and bypass was patent without flow limitation.     -pt continues to have decreased velocities in the RLE bypass graft.  She continues to have palpable pedal pulses bilaterally and without claudication, rest pain or non healing wounds.  Discussed with Dr. Donzetta Matters about the decreased velocities in her bypass graft and given she had angiogram in 2020 that revealed no flow limitation and continues to have palpable pulses, we will continue surveillance since the velocities have been low over several years.   -discussed with pt that we do not know how long her bypass will function but she knows to call us sooner rather than later if she develops any  rest pain or non healing wounds.   -she will continue her walking program -continue asa/statin -pt will f/u in one year with ABI and RLE arterial duplex. -fortunately, she has never smoked.   Leontine Locket, [facility] [phone]  Clinic MD:   Donzetta Matters

## 2023-02-17 ENCOUNTER — Ambulatory Visit: Payer: Medicare HMO

## 2023-02-17 ENCOUNTER — Encounter (HOSPITAL_COMMUNITY): Payer: Medicare HMO

## 2023-02-17 ENCOUNTER — Other Ambulatory Visit (HOSPITAL_COMMUNITY): Payer: Medicare HMO

## 2023-02-23 DIAGNOSIS — L578 Other skin changes due to chronic exposure to nonionizing radiation: Secondary | ICD-10-CM | POA: Diagnosis not present

## 2023-02-23 DIAGNOSIS — D2262 Melanocytic nevi of left upper limb, including shoulder: Secondary | ICD-10-CM | POA: Diagnosis not present

## 2023-02-23 DIAGNOSIS — L570 Actinic keratosis: Secondary | ICD-10-CM | POA: Diagnosis not present

## 2023-02-23 DIAGNOSIS — D2272 Melanocytic nevi of left lower limb, including hip: Secondary | ICD-10-CM | POA: Diagnosis not present

## 2023-02-23 DIAGNOSIS — Z808 Family history of malignant neoplasm of other organs or systems: Secondary | ICD-10-CM | POA: Diagnosis not present

## 2023-02-23 DIAGNOSIS — L814 Other melanin hyperpigmentation: Secondary | ICD-10-CM | POA: Diagnosis not present

## 2023-02-23 DIAGNOSIS — L821 Other seborrheic keratosis: Secondary | ICD-10-CM | POA: Diagnosis not present

## 2023-02-23 DIAGNOSIS — D225 Melanocytic nevi of trunk: Secondary | ICD-10-CM | POA: Diagnosis not present

## 2023-02-23 DIAGNOSIS — Z85828 Personal history of other malignant neoplasm of skin: Secondary | ICD-10-CM | POA: Diagnosis not present

## 2023-04-07 DIAGNOSIS — Z91041 Radiographic dye allergy status: Secondary | ICD-10-CM | POA: Diagnosis not present

## 2023-04-07 DIAGNOSIS — H547 Unspecified visual loss: Secondary | ICD-10-CM | POA: Diagnosis not present

## 2023-04-07 DIAGNOSIS — Z811 Family history of alcohol abuse and dependence: Secondary | ICD-10-CM | POA: Diagnosis not present

## 2023-04-07 DIAGNOSIS — M8580 Other specified disorders of bone density and structure, unspecified site: Secondary | ICD-10-CM | POA: Diagnosis not present

## 2023-04-07 DIAGNOSIS — Z801 Family history of malignant neoplasm of trachea, bronchus and lung: Secondary | ICD-10-CM | POA: Diagnosis not present

## 2023-04-07 DIAGNOSIS — M1990 Unspecified osteoarthritis, unspecified site: Secondary | ICD-10-CM | POA: Diagnosis not present

## 2023-04-07 DIAGNOSIS — Z85828 Personal history of other malignant neoplasm of skin: Secondary | ICD-10-CM | POA: Diagnosis not present

## 2023-04-07 DIAGNOSIS — Z96649 Presence of unspecified artificial hip joint: Secondary | ICD-10-CM | POA: Diagnosis not present

## 2023-04-07 DIAGNOSIS — I739 Peripheral vascular disease, unspecified: Secondary | ICD-10-CM | POA: Diagnosis not present

## 2023-04-07 DIAGNOSIS — Z8249 Family history of ischemic heart disease and other diseases of the circulatory system: Secondary | ICD-10-CM | POA: Diagnosis not present

## 2023-04-07 DIAGNOSIS — E785 Hyperlipidemia, unspecified: Secondary | ICD-10-CM | POA: Diagnosis not present

## 2023-04-07 DIAGNOSIS — Z825 Family history of asthma and other chronic lower respiratory diseases: Secondary | ICD-10-CM | POA: Diagnosis not present

## 2023-05-10 ENCOUNTER — Ambulatory Visit (INDEPENDENT_AMBULATORY_CARE_PROVIDER_SITE_OTHER): Payer: Medicare HMO

## 2023-05-10 VITALS — Wt 135.0 lb

## 2023-05-10 DIAGNOSIS — Z0000 Encounter for general adult medical examination without abnormal findings: Secondary | ICD-10-CM

## 2023-05-10 NOTE — Progress Notes (Signed)
I connected with  Elizabeth Douglas on 05/10/23 by a audio enabled telemedicine application and verified that I am speaking with the correct person using two identifiers.  Patient Location: Home  Provider Location: Office/Clinic  I discussed the limitations of evaluation and management by telemedicine. The patient expressed understanding and agreed to proceed.    Patient Medicare AWV questionnaire was completed by the patient on 05/07/23; I have confirmed that all information answered by patient is correct and no changes since this date.      Subjective:   Elizabeth Douglas is a 72 y.o. female who presents for Medicare Annual (Subsequent) preventive examination.  Review of Systems     Cardiac Risk Factors include: advanced age (>39men, >17 women);dyslipidemia     Objective:    Today's Vitals   05/10/23 0923  Weight: 135 lb (61.2 kg)   Body mass index is 21.14 kg/m.     05/10/2023    9:37 AM 05/07/2022    9:40 AM 05/01/2021    9:31 AM 01/28/2021   10:26 AM 07/03/2020   12:53 PM 07/03/2020    6:34 AM 06/24/2020   10:47 AM  Advanced Directives  Does Patient Have a Medical Advance Directive? No No No Yes No No No  Would patient like information on creating a medical advance directive? No - Patient declined No - Patient declined No - Patient declined  No - Patient declined No - Patient declined No - Patient declined    Current Medications (verified) Outpatient Encounter Medications as of 05/10/2023  Medication Sig   acetaminophen (TYLENOL) 500 MG tablet Take 1,000 mg by mouth at bedtime as needed for moderate pain.   aspirin EC 81 MG tablet Take 81 mg by mouth daily. Swallow whole.   Carboxymethylcellul-Glycerin (LUBRICATING EYE DROPS OP) Place 1 drop into both eyes daily.   Cholecalciferol (VITAMIN D) 50 MCG (2000 UT) tablet Take 2,000 Units by mouth daily.   Wheat Dextrin (BENEFIBER) POWD Take 1 Dose by mouth daily.    rosuvastatin (CRESTOR) 10 MG tablet 5 mg once a week.    [DISCONTINUED] atorvastatin (LIPITOR) 10 MG tablet Take 1 tablet (10 mg total) by mouth once a week.   [DISCONTINUED] omeprazole (PRILOSEC) 20 MG capsule TAKE 1 CAPSULE BY MOUTH EVERY DAY   No facility-administered encounter medications on file as of 05/10/2023.    Allergies (verified) Fish allergy, Rosuvastatin, and Iodixanol   History: Past Medical History:  Diagnosis Date   Anal fissure    Arthritis    hip   Atypical chest pain    Basal cell carcinoma    Skin cancer face   Complication of anesthesia    BP drops and passes out  [place] vomiting   DVT (deep venous thrombosis) (HCC)    Right Leg   Excess or deficiency of vitamin D    Gallbladder polyp    GERD (gastroesophageal reflux disease)    Hemorrhoids    Microhematuria    pt. denies   Osteopenia    Peripheral vascular disease (HCC)    PONV (postoperative nausea and vomiting)    Thrombocytopenia (HCC)    Past Surgical History:  Procedure Laterality Date   ABDOMINAL AORTOGRAM W/LOWER EXTREMITY Right 12/22/2018   Procedure: ABDOMINAL AORTOGRAM W/LOWER EXTREMITY [place];  Surgeon: Maeola Harman, MD;  Location: [facility] INVASIVE CV LAB;  Service: Cardiovascular;  Laterality: Right;   COLONOSCOPY  2005, 2015   INTRAOPERATIVE ARTERIOGRAM Right 11/29/2015   Procedure: INTRA OPERATIVE ARTERIOGRAM;  Surgeon: Ottie Glazier  Imogene Burn, MD;  Location: [facility] OR;  Service: Vascular;  Laterality: Right;   JOINT REPLACEMENT     right total hip Aluisio 02-16-18   LUNG BIOPSY     THROMBECTOMY FEMORAL ARTERY Right 11/29/2015   Procedure:  RIGHT  POPLITEAL to anterior tibial and posterior tibial ARTERY thrombectomy. Right above knee to below knee popliteal artery bypass;  Surgeon: Fransisco Hertz, MD;  Location: [facility] OR;  Service: Vascular;  Laterality: Right;   TOTAL HIP ARTHROPLASTY Right 02/16/2018   Procedure: RIGHT TOTAL HIP ARTHROPLASTY ANTERIOR APPROACH;  Surgeon: Ollen Gross, MD;  Location: [facility] ORS;  Service: Orthopedics;  Laterality: Right;    TOTAL HIP ARTHROPLASTY Left 07/03/2020   Procedure: TOTAL HIP ARTHROPLASTY ANTERIOR APPROACH;  Surgeon: Ollen Gross, MD;  Location: [facility] ORS;  Service: Orthopedics;  Laterality: Left;    UPPER GASTROINTESTINAL ENDOSCOPY  2009   Family History  Problem Relation Age of Onset   COPD Mother    Osteoporosis Mother    Prostate cancer Father    Heart disease Brother 69       Before age 43   Heart attack Brother 68       Massive   Lymphoma Brother        non and hodgkins   Coronary artery disease Brother    Colon cancer Neg Hx    Colon polyps Neg Hx    Rectal cancer Neg Hx    Stomach cancer Neg Hx    Social History   Socioeconomic History   Marital status: Married    Spouse name: Not on file   Number of children: 1   Years of education: Not on file   Highest education level: Not on file  Occupational History   Occupation: Retired  Tobacco Use   Smoking status: Never   Smokeless tobacco: Never  Vaping Use   Vaping Use: Never used  Substance and Sexual Activity   Alcohol use: No    Alcohol/week: 0.0 standard drinks of alcohol   Drug use: No   Sexual activity: Yes    Birth control/protection: Post-menopausal  Other Topics Concern   Not on file  Social History Narrative   Married. 1 daughter. No grandkids. 1 granddog and 1 grandcat.       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       Hobbies: walking 2.5 miles  [place], stays active   Social Determinants of Health   Financial Resource Strain: Low Risk  (05/07/2023)   Overall Financial Resource Strain (CARDIA)    Difficulty of Paying Living Expenses: Not hard at all  Food Insecurity: No Food Insecurity (05/07/2023)   Hunger Vital Sign    Worried About Running Out of Food in the Last Year: Never true    Ran Out of Food in the Last Year: Never true  Transportation Needs: No Transportation Needs (05/07/2023)   PRAPARE - Administrator, Civil Service  (Medical): No    Lack of Transportation (Non-Medical): No  Physical Activity: Sufficiently Active (05/07/2023)   Exercise Vital Sign    Days of Exercise per Week: 5 days    Minutes of Exercise per Session: 40 min  Stress: No Stress Concern Present (05/07/2023)   [employer] of Occupational Health - Occupational Stress Questionnaire    Feeling of Stress : Not at all  Social Connections: Moderately Integrated (05/07/2023)   Social Connection and Isolation Panel [NHANES]    Frequency  of Communication with Friends and Family: More than three times a week    Frequency of Social Gatherings with Friends and Family: Once a week    Attends Religious Services: More than 4 times per year    Active Member of [employer] or Organizations: No    Attends Engineer, structural: Never    Marital Status: Married    Tobacco Counseling Counseling given: Not Answered   Clinical Intake:  Pre-visit preparation completed: Yes  Pain : No/denies pain     BMI - recorded: 21.14 Nutritional Status: BMI of 19-24  Normal Nutritional Risks: None Diabetes: No  How often do you need to have someone help you when you read instructions, pamphlets, or other written materials from your doctor or pharmacy?: 1 - Never  Diabetic?no  Interpreter Needed?: No  Information entered by :: Lanier Ensign, LPN   Activities of Daily Living    05/07/2023   11:57 AM  In your present state of health, do you have any difficulty performing the following activities:  Hearing? 0  Vision? 0  Difficulty concentrating or making decisions? 0  Walking or climbing stairs? 0  Dressing or bathing? 0  Doing errands, shopping? 0  Preparing Food and eating ? N  Using the Toilet? N  In the past six months, have you accidently leaked urine? N  Do you have problems with loss of bowel control? N  Managing your Medications? N  Managing your Finances? N  Housekeeping or managing your Housekeeping? N    Patient Care  Team: Shelva Majestic, MD as PCP - General (Family Medicine) Elmon Else, MD as Consulting Physician (Dermatology) Rachael Fee, MD as Attending Physician (Gastroenterology) Maeola Harman, MD as Consulting Physician (Vascular Surgery) Ollen Gross, MD as Consulting Physician (Orthopedic Surgery)  Indicate any recent Medical Services you may have received from other than [facility] providers in the past year (date may be approximate).     Assessment:   This is a routine wellness examination for Zariaha.  Hearing/Vision screen Hearing Screening - Comments:: Pt denies any hearing issues  Vision Screening - Comments:: Pt follows up with [facility] for annual eye exams   Dietary issues and exercise activities discussed: Current Exercise Habits: Home exercise routine, Type of exercise: walking;stretching;Other - see comments, Time (Minutes): 40, Frequency (Times/Week): 5, Weekly Exercise (Minutes/Week): 200   Goals Addressed             This Visit's Progress    Patient Stated       None at this time        Depression Screen    05/10/2023    9:36 AM 12/18/2022   10:34 AM 05/07/2022    9:40 AM 05/06/2022    8:06 AM 05/01/2021    9:30 AM 04/29/2021    8:11 AM 04/25/2020    8:22 AM  PHQ 2/9 Scores  PHQ - 2 Score 0 0 0 0 0 0 0  PHQ- 9 Score    0  0     Fall Risk    05/07/2023   11:57 AM 05/07/2022    9:41 AM 12/05/2021    9:46 AM 05/01/2021    9:32 AM 04/29/2021    8:03 AM  Fall Risk   Falls in the past year? 0 0 0 0 0  Number falls in past yr: 0 0  0 0  Injury with Fall? 0 0  0   Risk for fall due to : Impaired vision  Impaired vision No Fall Risks Impaired vision   Follow up Falls prevention discussed Falls prevention discussed  Falls prevention discussed     FALL RISK PREVENTION PERTAINING TO THE HOME:  Any stairs in or around the home? Yes  If so, are there any without handrails? No  Home free of loose throw rugs in walkways, pet beds, electrical cords, etc? Yes   Adequate lighting in your home to reduce risk of falls? Yes   ASSISTIVE DEVICES UTILIZED TO PREVENT FALLS:  Life alert? No  Use of a cane, walker or w/c? No  Grab bars in the bathroom? No  Shower chair or bench in shower? Yes  Elevated toilet seat or a handicapped toilet? Yes   TIMED UP AND GO:  Was the test performed? Yes .  Cognitive Function:        05/10/2023    9:39 AM 05/07/2022    9:43 AM 05/01/2021    9:33 AM  6CIT Screen  What Year? 0 points 0 points 0 points  What month? 0 points 0 points 0 points  What time? 0 points 0 points 0 points  Count back from 20 0 points 0 points 0 points  Months in reverse 0 points 0 points 0 points  Repeat phrase 0 points 0 points 0 points  Total Score 0 points 0 points 0 points    Immunizations Immunization History  Administered Date(s) Administered   Fluad Quad(high Dose 65+) 09/06/2022   Influenza Whole 09/19/2007   Influenza, High Dose Seasonal PF 09/09/2018, 08/22/2019   Influenza,inj,Quad PF,6+ Mos 09/26/2013, 08/30/2014, 09/04/2015, 09/13/2017   Influenza,inj,quad, With Preservative 09/09/2018   Influenza-Unspecified 09/22/2017, 09/09/2018, 08/22/2019, 09/22/2021   Moderna SARS-COV2 Booster Vaccination 10/30/2020   Moderna Sars-Covid-2 Vaccination 01/03/2020, 02/01/2020   PFIZER(Purple Top)SARS-COV-2 Vaccination 10/30/2020   PNEUMOCOCCAL CONJUGATE-20 04/29/2021   Pneumococcal-Unspecified 03/31/2021, 04/23/2021   Td 05/12/2004   Tdap 12/01/2007, 04/22/2016   Unspecified SARS-COV-2 Vaccination 10/30/2020   Zoster Recombinat (Shingrix) 07/12/2022, 10/27/2022   Zoster, Live 11/14/2014    TDAP status: Up to date  Flu Vaccine status: Up to date  Pneumococcal vaccine status: Up to date  Covid-19 vaccine status: Completed vaccines  Qualifies for Shingles Vaccine? Yes   Zostavax completed Yes   Shingrix Completed?: Yes  Screening Tests Health Maintenance  Topic Date Due   COVID-19 Vaccine (5 - 2023-24 season)  07/31/2022   MAMMOGRAM  04/30/2023   INFLUENZA VACCINE  07/01/2023   Colonoscopy  01/09/2024   Medicare Annual Wellness (AWV)  05/09/2024   DTaP/Tdap/Td (4 - Td or Tdap) 04/22/2026   Pneumonia Vaccine 87+ Years old  Completed   DEXA SCAN  Completed   Hepatitis C Screening  Completed   Zoster Vaccines- Shingrix  Completed   HPV VACCINES  Aged Out    Health Maintenance  Health Maintenance Due  Topic Date Due   COVID-19 Vaccine (5 - 2023-24 season) 07/31/2022   MAMMOGRAM  04/30/2023    Colorectal cancer screening: Type of screening: Colonoscopy. Completed 01/08/14. Repeat every 10 years  Mammogram status: Completed 04/29/22. Repeat every year scheduled 05/28/23   Bone Density status: Completed 06/09/22. Results reflect: Bone density results: OSTEOPENIA. Repeat every 2 years.   Additional Screening:  Hepatitis C Screening:  Completed 04/29/17  Vision Screening: Recommended annual ophthalmology exams for early detection of glaucoma and other disorders of the eye. Is the patient up to date with their annual eye exam?  Yes  Who is the provider or what is the name of the office  in which the patient attends annual eye exams? [facility]  If pt is not established with a provider, would they like to be referred to a provider to establish care? No .   Dental Screening: Recommended annual dental exams for proper oral hygiene  Community Resource Referral / Chronic Care Management: CRR required this visit?  No   CCM required this visit?  No      Plan:     I have personally reviewed and noted the following in the patient's chart:   Medical and social history Use of alcohol, tobacco or illicit drugs  Current medications and supplements including opioid prescriptions. Patient is not currently taking opioid prescriptions. Functional ability and status Nutritional status Physical activity Advanced directives List of other physicians Hospitalizations, surgeries, and ER visits in previous  12 months Vitals Screenings to include cognitive, depression, and falls Referrals and appointments  In addition, I have reviewed and discussed with patient certain preventive protocols, quality metrics, and best practice recommendations. A written personalized care plan for preventive services as well as general preventive health recommendations were provided to patient.     Marzella Schlein, LPN   1/61/0960   Nurse Notes: none

## 2023-05-10 NOTE — Patient Instructions (Signed)
[name] , Thank you for taking time to come for your Medicare Wellness Visit. I appreciate your ongoing commitment to your health goals. Please review the following plan we discussed and let me know if I can assist you in the future.   These are the goals we discussed:  Goals      Patient Stated     None at this time     Patient Stated     Continue to stay healthy        This is a list of the screening recommended for you and due dates:  Health Maintenance  Topic Date Due   COVID-19 Vaccine (5 - 2023-24 season) 07/31/2022   Mammogram  04/30/2023   Flu Shot  07/01/2023   Colon Cancer Screening  01/09/2024   Medicare Annual Wellness Visit  05/09/2024   DTaP/Tdap/Td vaccine (4 - Td or Tdap) 04/22/2026   Pneumonia Vaccine  Completed   DEXA scan (bone density measurement)  Completed   Hepatitis C Screening  Completed   Zoster (Shingles) Vaccine  Completed   HPV Vaccine  Aged Out    Advanced directives: Advance directive discussed with you today. Even though you declined this today please call our office should you change your mind and we can give you the proper paperwork for you to fill out.  Conditions/risks identified: none at this time   Next appointment: Follow up in one year for your annual wellness visit    Preventive Care 65 Years and Older, Female Preventive care refers to lifestyle choices and visits with your health care provider that can promote health and wellness. What does preventive care include? A yearly physical exam. This is also called an annual well check. Dental exams once or twice a year. Routine eye exams. Ask your health care provider how often you should have your eyes checked. Personal lifestyle choices, including: Daily care of your teeth and gums. Regular physical activity. Eating a healthy diet. Avoiding tobacco and drug use. Limiting alcohol use. Practicing safe sex. Taking low-dose aspirin every day. Taking vitamin and mineral  supplements as recommended by your health care provider. What happens during an annual well check? The services and screenings done by your health care provider during your annual well check will depend on your age, overall health, lifestyle risk factors, and family history of disease. Counseling  Your health care provider may ask you questions about your: Alcohol use. Tobacco use. Drug use. Emotional well-being. Home and relationship well-being. Sexual activity. Eating habits. History of falls. Memory and ability to understand (cognition). Work and work Astronomer. Reproductive health. Screening  You may have the following tests or measurements: Height, weight, and BMI. Blood pressure. Lipid and cholesterol levels. These may be checked every 5 years, or more frequently if you are over 17 years old. Skin check. Lung cancer screening. You may have this screening every year starting at age 66 if you have a 30-pack-year history of smoking and currently smoke or have quit within the past 15 years. Fecal occult blood test (FOBT) of the stool. You may have this test every year starting at age 54. Flexible sigmoidoscopy or colonoscopy. You may have a sigmoidoscopy every 5 years or a colonoscopy every 10 years starting at age 3. Hepatitis C blood test. Hepatitis B blood test. Sexually transmitted disease (STD) testing. Diabetes screening. This is done by checking your blood sugar (glucose) after you have not eaten for a while (fasting). You may have this done every 1-3 years.  Bone density scan. This is done to screen for osteoporosis. You may have this done starting at age 33. Mammogram. This may be done every 1-2 years. Talk to your health care provider about how often you should have regular mammograms. Talk with your health care provider about your test results, treatment options, and if necessary, the need for more tests. Vaccines  Your health care provider may recommend certain  vaccines, such as: Influenza vaccine. This is recommended every year. Tetanus, diphtheria, and acellular pertussis (Tdap, Td) vaccine. You may need a Td booster every 10 years. Zoster vaccine. You may need this after age 14. Pneumococcal 13-valent conjugate (PCV13) vaccine. One dose is recommended after age 38. Pneumococcal polysaccharide (PPSV23) vaccine. One dose is recommended after age 64. Talk to your health care provider about which screenings and vaccines you need and how often you need them. This information is not intended to replace advice given to you by your health care provider. Make sure you discuss any questions you have with your health care provider. Document Released: 12/13/2015 Document Revised: 08/05/2016 Document Reviewed: 09/17/2015 Elsevier Interactive Patient Education  2017 [place] Prevention in the Home Falls can cause injuries. They can happen to people of all ages. There are many things you can do to make your home safe and to help prevent falls. What can I do on the outside of my home? Regularly fix the edges of walkways and driveways and fix any cracks. Remove anything that might make you trip as you walk through a door, such as a raised step or threshold. Trim any bushes or trees on the path to your home. Use bright outdoor lighting. Clear any walking paths of anything that might make someone trip, such as rocks or tools. Regularly check to see if handrails are loose or broken. Make sure that both sides of any steps have handrails. Any raised decks and porches should have guardrails on the edges. Have any leaves, snow, or ice cleared regularly. Use sand or salt on walking paths during winter. Clean up any spills in your garage right away. This includes oil or grease spills. What can I do in the bathroom? Use night lights. Install grab bars by the toilet and in the tub and shower. Do not use towel bars as grab bars. Use non-skid mats or decals in  the tub or shower. If you need to sit down in the shower, use a plastic, non-slip stool. Keep the floor dry. Clean up any water that spills on the floor as soon as it happens. Remove soap buildup in the tub or shower regularly. Attach bath mats securely with double-sided non-slip rug tape. Do not have throw rugs and other things on the floor that can make you trip. What can I do in the bedroom? Use night lights. Make sure that you have a light by your bed that is easy to reach. Do not use any sheets or blankets that are too big for your bed. They should not hang down onto the floor. Have a firm chair that has side arms. You can use this for support while you get dressed. Do not have throw rugs and other things on the floor that can make you trip. What can I do in the kitchen? Clean up any spills right away. Avoid walking on wet floors. Keep items that you use a lot in easy-to-reach places. If you need to reach something above you, use a strong step stool that has a grab bar.  Keep electrical cords out of the way. Do not use floor polish or wax that makes floors slippery. If you must use wax, use non-skid floor wax. Do not have throw rugs and other things on the floor that can make you trip. What can I do with my stairs? Do not leave any items on the stairs. Make sure that there are handrails on both sides of the stairs and use them. Fix handrails that are broken or loose. Make sure that handrails are as long as the stairways. Check any carpeting to make sure that it is firmly attached to the stairs. Fix any carpet that is loose or worn. Avoid having throw rugs at the top or bottom of the stairs. If you do have throw rugs, attach them to the floor with carpet tape. Make sure that you have a light switch at the top of the stairs and the bottom of the stairs. If you do not have them, ask someone to add them for you. What else can I do to help prevent falls? Wear shoes that: Do not have high  heels. Have rubber bottoms. Are comfortable and fit you well. Are closed at the toe. Do not wear sandals. If you use a stepladder: Make sure that it is fully opened. Do not climb a closed stepladder. Make sure that both sides of the stepladder are locked into place. Ask someone to hold it for you, if possible. Clearly mark and make sure that you can see: Any grab bars or handrails. First and last steps. Where the edge of each step is. Use tools that help you move around (mobility aids) if they are needed. These include: Canes. Walkers. Scooters. Crutches. Turn on the lights when you go into a dark area. Replace any light bulbs as soon as they burn out. Set up your furniture so you have a clear path. Avoid moving your furniture around. If any of your floors are uneven, fix them. If there are any pets around you, be aware of where they are. Review your medicines with your doctor. Some medicines can make you feel dizzy. This can increase your chance of falling. Ask your doctor what other things that you can do to help prevent falls. This information is not intended to replace advice given to you by your health care provider. Make sure you discuss any questions you have with your health care provider. Document Released: 09/12/2009 Document Revised: 04/23/2016 Document Reviewed: 12/21/2014 Elsevier Interactive Patient Education  2017 [employer].

## 2023-05-28 DIAGNOSIS — Z1231 Encounter for screening mammogram for malignant neoplasm of breast: Secondary | ICD-10-CM | POA: Diagnosis not present

## 2023-05-28 LAB — HM MAMMOGRAPHY

## 2023-05-31 ENCOUNTER — Encounter: Payer: Self-pay | Admitting: Family Medicine

## 2023-06-07 ENCOUNTER — Encounter: Payer: Medicare HMO | Admitting: Family Medicine

## 2023-06-30 ENCOUNTER — Encounter (INDEPENDENT_AMBULATORY_CARE_PROVIDER_SITE_OTHER): Payer: Self-pay

## 2023-07-28 ENCOUNTER — Encounter: Payer: Medicare HMO | Admitting: Family Medicine

## 2023-08-03 ENCOUNTER — Encounter: Payer: Self-pay | Admitting: Family Medicine

## 2023-08-04 ENCOUNTER — Other Ambulatory Visit: Payer: Self-pay

## 2023-08-04 DIAGNOSIS — E785 Hyperlipidemia, unspecified: Secondary | ICD-10-CM

## 2023-08-04 MED ORDER — ROSUVASTATIN CALCIUM 5 MG PO TABS: 5.0000 mg | ORAL_TABLET | Freq: Every day | ORAL | 3 refills | Status: DC

## 2023-09-13 ENCOUNTER — Encounter: Payer: Self-pay | Admitting: Family Medicine

## 2023-09-14 ENCOUNTER — Other Ambulatory Visit: Payer: Self-pay

## 2023-09-14 DIAGNOSIS — E785 Hyperlipidemia, unspecified: Secondary | ICD-10-CM

## 2023-09-14 MED ORDER — ROSUVASTATIN CALCIUM 5 MG PO TABS: 5.0000 mg | ORAL_TABLET | ORAL | 3 refills | Status: DC

## 2023-09-16 ENCOUNTER — Other Ambulatory Visit (INDEPENDENT_AMBULATORY_CARE_PROVIDER_SITE_OTHER): Payer: Medicare HMO

## 2023-09-16 DIAGNOSIS — E785 Hyperlipidemia, unspecified: Secondary | ICD-10-CM

## 2023-09-16 LAB — COMPREHENSIVE METABOLIC PANEL
ALT: 9 U/L (ref 0–35)
AST: 20 U/L (ref 0–37)
Albumin: 4 g/dL (ref 3.5–5.2)
Alkaline Phosphatase: 43 U/L (ref 39–117)
BUN: 13 mg/dL (ref 6–23)
CO2: 30 meq/L (ref 19–32)
Calcium: 9.2 mg/dL (ref 8.4–10.5)
Chloride: 104 meq/L (ref 96–112)
Creatinine, Ser: 0.69 mg/dL (ref 0.40–1.20)
GFR: 88.02 mL/min (ref >60.00)
Glucose, Bld: 78 mg/dL (ref 70–99)
Potassium: 4 meq/L (ref 3.5–5.1)
Sodium: 140 meq/L (ref 135–145)
Total Bilirubin: 0.5 mg/dL (ref 0.2–1.2)
Total Protein: 6.7 g/dL (ref 6.0–8.3)

## 2023-09-16 LAB — LIPID PANEL
Cholesterol: 159 mg/dL (ref 0–200)
HDL: 63.1 mg/dL (ref >39.00)
LDL Cholesterol: 86 mg/dL (ref 0–99)
NonHDL: 95.71
Total CHOL/HDL Ratio: 3
Triglycerides: 48 mg/dL (ref 0.0–149.0)
VLDL: 9.6 mg/dL (ref 0.0–40.0)

## 2023-09-18 LAB — LIPOPROTEIN A (LPA): Lipoprotein (a): 11 nmol/L (ref <75)

## 2023-09-22 DIAGNOSIS — L820 Inflamed seborrheic keratosis: Secondary | ICD-10-CM | POA: Diagnosis not present

## 2023-09-22 DIAGNOSIS — L570 Actinic keratosis: Secondary | ICD-10-CM | POA: Diagnosis not present

## 2023-10-21 DIAGNOSIS — R69 Illness, unspecified: Secondary | ICD-10-CM | POA: Diagnosis not present

## 2023-11-11 DIAGNOSIS — H5203 Hypermetropia, bilateral: Secondary | ICD-10-CM | POA: Diagnosis not present

## 2023-11-11 DIAGNOSIS — Z0100 Encounter for examination of eyes and vision without abnormal findings: Secondary | ICD-10-CM | POA: Diagnosis not present

## 2023-11-25 DIAGNOSIS — R69 Illness, unspecified: Secondary | ICD-10-CM | POA: Diagnosis not present

## 2024-01-03 ENCOUNTER — Ambulatory Visit: Payer: Self-pay | Admitting: Family Medicine

## 2024-01-03 ENCOUNTER — Ambulatory Visit: Payer: Medicare HMO | Admitting: Family Medicine

## 2024-01-03 VITALS — BP 99/66 | HR 57 | Temp 97.9°F | Resp 16 | Ht 67.0 in | Wt 142.4 lb

## 2024-01-03 DIAGNOSIS — R319 Hematuria, unspecified: Secondary | ICD-10-CM

## 2024-01-03 DIAGNOSIS — R1030 Lower abdominal pain, unspecified: Secondary | ICD-10-CM

## 2024-01-03 DIAGNOSIS — R350 Frequency of micturition: Secondary | ICD-10-CM

## 2024-01-03 DIAGNOSIS — N3091 Cystitis, unspecified with hematuria: Secondary | ICD-10-CM

## 2024-01-03 LAB — POCT URINALYSIS DIPSTICK
Bilirubin, UA: NEGATIVE
Glucose, UA: NEGATIVE
Ketones, UA: NEGATIVE
Nitrite, UA: NEGATIVE
Protein, UA: NEGATIVE
Spec Grav, UA: 1.015 (ref 1.010–1.025)
Urobilinogen, UA: 0.2 U/dL
pH, UA: 6 (ref 5.0–8.0)

## 2024-01-03 MED ORDER — CEPHALEXIN 500 MG PO CAPS: 500.0000 mg | ORAL_CAPSULE | Freq: Two times a day (BID) | ORAL | 0 refills | Status: AC

## 2024-01-03 MED ORDER — PHENAZOPYRIDINE HCL 100 MG PO TABS: 100.0000 mg | ORAL_TABLET | Freq: Three times a day (TID) | ORAL | 0 refills | Status: AC | PRN

## 2024-01-03 NOTE — Patient Instructions (Signed)
Culture sent  Meds sent  Can stop the pyridium when feeling better

## 2024-01-03 NOTE — Telephone Encounter (Signed)
Scheduled for today with Dr. Ruthine Dose.

## 2024-01-03 NOTE — Progress Notes (Signed)
Subjective:     Patient ID: Elizabeth Douglas, female    DOB: Jan 05, 1951, 73 y.o.   MRN: [id number]  Chief Complaint  Patient presents with   Abdominal Pain    Pain and pressure in pelvic area Sx started last night   Urinary Frequency   Hematuria    Had a little bit of blood on the tissue   Dysuria    HPI Discussed the use of AI scribe software for clinical note transcription with the patient, who gave verbal consent to proceed.  History of Present Illness   The patient presents with symptoms suggestive of a urinary tract infection. She is accompanied by her husband, Gabriel Rung.  Symptoms began last night around 5 or 6 PM, including frequent urination every 10 to 15 minutes, significant dysuria, and a sensation of pressure in the lower abdomen. She noticed pink discoloration on the tissue when wiping, indicating possible hematuria. She attempted to alleviate symptoms by drinking 16 ounces of water and took Tylenol before bed, which provided some relief, although she still had to urinate four times during the night.  Today, she continues to experience burning during urination, though it is less severe than the previous night. She also reports feeling pressure in the lower abdomen and an urgent need to urinate. Last night, she experienced chills and believes she may have had a fever, although she did not measure her temperature. No recent sexual intercourse that could have triggered the symptoms, but she mentions starting to use Replens about two and a half weeks ago, having previously used coconut oil. She is uncertain if this change could be related to her symptoms.  She has a history of two previous UTIs, with the last occurrence in 2023. UTIs are infrequent. No significant back pain, although she mentions slight discomfort that is not noteworthy.  Her current medication includes rosuvastatin. She is not aware of any allergies to antibiotics.       Health Maintenance Due  Topic Date Due    COVID-19 Vaccine (5 - 2024-25 season) 08/01/2023   Colonoscopy  01/09/2024    Past Medical History:  Diagnosis Date   Anal fissure    Arthritis    hip   Atypical chest pain    Basal cell carcinoma    Skin cancer face   Complication of anesthesia    BP drops and passes out  [place] vomiting   DVT (deep venous thrombosis) (HCC)    Right Leg   Excess or deficiency of vitamin D    Gallbladder polyp    GERD (gastroesophageal reflux disease)    Hemorrhoids    Microhematuria    pt. denies   Osteopenia    Peripheral vascular disease (HCC)    PONV (postoperative nausea and vomiting)    Thrombocytopenia (HCC)     Past Surgical History:  Procedure Laterality Date   ABDOMINAL AORTOGRAM W/LOWER EXTREMITY Right 12/22/2018   Procedure: ABDOMINAL AORTOGRAM W/LOWER EXTREMITY [place];  Surgeon: Maeola Harman, MD;  Location: [facility] INVASIVE CV LAB;  Service: Cardiovascular;  Laterality: Right;   COLONOSCOPY  2005, 2015   INTRAOPERATIVE ARTERIOGRAM Right 11/29/2015   Procedure: INTRA OPERATIVE ARTERIOGRAM;  Surgeon: Fransisco Hertz, MD;  Location: [facility] OR;  Service: Vascular;  Laterality: Right;   JOINT REPLACEMENT     right total hip Aluisio 02-16-18   LUNG BIOPSY     THROMBECTOMY FEMORAL ARTERY Right 11/29/2015   Procedure:  RIGHT  POPLITEAL to anterior tibial and posterior tibial ARTERY thrombectomy.  Right above knee to below knee popliteal artery bypass;  Surgeon: Fransisco Hertz, MD;  Location: [facility] OR;  Service: Vascular;  Laterality: Right;   TOTAL HIP ARTHROPLASTY Right 02/16/2018   Procedure: RIGHT TOTAL HIP ARTHROPLASTY ANTERIOR APPROACH;  Surgeon: Ollen Gross, MD;  Location: [facility] ORS;  Service: Orthopedics;  Laterality: Right;   TOTAL HIP ARTHROPLASTY Left 07/03/2020   Procedure: TOTAL HIP ARTHROPLASTY ANTERIOR APPROACH;  Surgeon: Ollen Gross, MD;  Location: [facility] ORS;  Service: Orthopedics;  Laterality: Left;    UPPER GASTROINTESTINAL ENDOSCOPY  2009     Current Outpatient  Medications:    acetaminophen (TYLENOL) 500 MG tablet, Take 1,000 mg by mouth at bedtime as needed for moderate pain., Disp: , Rfl:    aspirin EC 81 MG tablet, Take 81 mg by mouth daily. Swallow whole., Disp: , Rfl:    Carboxymethylcellul-Glycerin (LUBRICATING EYE DROPS OP), Place 1 drop into both eyes daily., Disp: , Rfl:    cephALEXin (KEFLEX) 500 MG capsule, Take 1 capsule (500 mg total) by mouth 2 (two) times daily for 7 days. Take for 7 days, Disp: 14 capsule, Rfl: 0   Cholecalciferol (VITAMIN D) 50 MCG (2000 UT) tablet, Take 2,000 Units by mouth daily., Disp: , Rfl:    phenazopyridine (PYRIDIUM) 100 MG tablet, Take 1 tablet (100 mg total) by mouth 3 (three) times daily as needed for pain., Disp: 6 tablet, Rfl: 0   rosuvastatin (CRESTOR) 5 MG tablet, Take 1 tablet (5 mg total) by mouth once a week., Disp: 13 tablet, Rfl: 3   Wheat Dextrin (BENEFIBER) POWD, Take 1 Dose by mouth daily. , Disp: , Rfl:   Allergies  Allergen Reactions   Fish Allergy Anaphylaxis and Swelling    Pt felt like throat was closing last time she ate    Iodixanol Rash    Contrast Dye    ROS neg/noncontributory except as noted HPI/below      Objective:     BP 99/66   Pulse (!) 57   Temp 97.9 F (36.6 C) (Temporal)   Resp 16   Ht 5\' 7"  (1.702 m)   Wt 142 lb 6 oz (64.6 kg)   SpO2 99%   BMI 22.30 kg/m  Wt Readings from Last 3 Encounters:  01/03/24 142 lb 6 oz (64.6 kg)  05/10/23 135 lb (61.2 kg)  01/27/23 135 lb (61.2 kg)    Physical Exam   Gen: WDWN NAD HEENT: NCAT, conjunctiva not injected, sclera nonicteric ABDOMEN:  BS+, soft, NTND, No HSM, no masses. No CVAT EXT:  no edema MSK: no gross abnormalities.  NEURO: A&O x3.  CN II-XII intact.  PSYCH: normal mood. Good eye contact  Results for orders placed or performed in visit on 01/03/24  POCT Urinalysis Dipstick   Collection Time: 01/03/24 10:29 AM  Result Value Ref Range   Color, UA yellow    Clarity, UA clear    Glucose, UA Negative  Negative   Bilirubin, UA negative    Ketones, UA negative    Spec Grav, UA 1.015 1.010 - 1.025   Blood, UA 2+    pH, UA 6.0 5.0 - 8.0   Protein, UA Negative Negative   Urobilinogen, UA 0.2 0.2 or 1.0 E.U./dL   Nitrite, UA negative    Leukocytes, UA Trace (A) Negative   Appearance     Odor         Assessment & Plan:  Cystitis with hematuria -     Urine Culture  Urinary  frequency -     POCT urinalysis dipstick  Hematuria, unspecified type  Lower abdominal pain  Other orders -     Cephalexin; Take 1 capsule (500 mg total) by mouth 2 (two) times daily for 7 days. Take for 7 days  Dispense: 14 capsule; Refill: 0 -     Phenazopyridine HCl; Take 1 tablet (100 mg total) by mouth 3 (three) times daily as needed for pain.  Dispense: 6 tablet; Refill: 0  Assessment and Plan    Urinary Tract Infection (UTI) Acute urinary urgency, frequency, dysuria, and lower abdominal pressure began last night, accompanied by hematuria and leukocytes in the urine. Possible fever and chills suggest systemic involvement. Differential diagnosis includes UTI and less likely kidney stone due to the absence of significant back pain. Keflex and Pyridium were discussed, noting Keflex's odor and Pyridium's potential to cause orange urine discoloration. Emphasized completing the antibiotic course and using Pyridium as needed. A urine culture will confirm antibiotic suitability, with adjustments made if necessary. Prescribe Keflex and Pyridium for symptomatic relief. Send urine sample for culture. Advise drinking plenty of water and instruct to contact the clinic if symptoms worsen or do not improve. Review culture results in two days and adjust antibiotics if necessary.  Hyperlipidemia Chronic condition managed with rosuvastatin. Continue rosuvastatin.  Follow-up Review urine culture results in two days and contact if an antibiotic change is necessary.        Return if symptoms worsen or fail to improve.  Angelena Sole, MD

## 2024-01-03 NOTE — Telephone Encounter (Signed)
Copied from CRM [id number]. Topic: Clinical - Red Word Triage >> Jan 03, 2024  8:13 AM Orinda Kenner C wrote: Red Word that prompted transfer to Nurse Triage: Patient [phone] thinks she has UTI, urgency, pain in the abdomen lower left side and when urinating, blood in the urine, chills, fever last night. Patient denies headaches, or dizziness.  Chief Complaint: urinary symptoms Symptoms: pain, frequency, blood in urine. Frequency: started last night Pertinent Negatives: Patient denies flank pain, fever Disposition: [] ED /[] Urgent Care (no appt availability in office) / [x] Appointment(In office/virtual)/ []  [facility]/ [] Home Care/ [] Refused Recommended Disposition /[] [facility]/ []  Follow-up with PCP Additional Notes: states symptoms started yesterday, pain with urination, frequency, blood in urine.  Apt made for this afternoon. Pcp office updated.  Care advice given, denies questions.  Reason for Disposition  Side (flank) or lower back pain present  Answer Assessment - Initial Assessment Questions 1. SYMPTOM: "What's the main symptom you're concerned about?" (e.g., frequency, incontinence)     Pain on left side, frequency. 2. ONSET: "When did the  buring/pain  start?"     Last night 3. PAIN: "Is there any pain?" If Yes, ask: "How bad is it?" (Scale: 1-10; mild, moderate, severe)     5-6/10 4. CAUSE: "What do you think is causing the symptoms?"     uti 5. OTHER SYMPTOMS: "Do you have any other symptoms?" (e.g., blood in urine, fever, flank pain, pain with urination)     Blood in urine, frequency 6. PREGNANCY: "Is there any chance you are pregnant?" "When was your last menstrual period?"     na  Protocols used: Urinary Symptoms-A-AH

## 2024-01-07 ENCOUNTER — Encounter: Payer: Self-pay | Admitting: Family Medicine

## 2024-01-10 ENCOUNTER — Other Ambulatory Visit: Payer: Medicare HMO

## 2024-01-10 DIAGNOSIS — N3091 Cystitis, unspecified with hematuria: Secondary | ICD-10-CM | POA: Diagnosis not present

## 2024-01-12 ENCOUNTER — Encounter: Payer: Self-pay | Admitting: Family Medicine

## 2024-01-12 LAB — URINE CULTURE
MICRO NUMBER:: 16063489
SPECIMEN QUALITY:: ADEQUATE

## 2024-01-12 NOTE — Progress Notes (Signed)
On 2/3 she was placed on keflex-did she take it all? Then culture sent on 2/10-was she having new symptoms?  Didn't feel better? Still has some bacteria-need to know how things are going to determine next step

## 2024-01-13 ENCOUNTER — Other Ambulatory Visit: Payer: Self-pay | Admitting: *Deleted

## 2024-01-13 MED ORDER — CEPHALEXIN 500 MG PO CAPS: 500.0000 mg | ORAL_CAPSULE | Freq: Two times a day (BID) | ORAL | 0 refills | Status: AC

## 2024-01-21 ENCOUNTER — Other Ambulatory Visit: Payer: Self-pay

## 2024-01-21 DIAGNOSIS — Z95828 Presence of other vascular implants and grafts: Secondary | ICD-10-CM

## 2024-01-21 DIAGNOSIS — I739 Peripheral vascular disease, unspecified: Secondary | ICD-10-CM

## 2024-02-02 ENCOUNTER — Ambulatory Visit (HOSPITAL_COMMUNITY)
Admission: RE | Admit: 2024-02-02 | Discharge: 2024-02-02 | Disposition: A | Discharge status: Home / Self Care | Payer: Medicare HMO | Source: Ambulatory Visit | Attending: Vascular Surgery | Admitting: Vascular Surgery

## 2024-02-02 ENCOUNTER — Ambulatory Visit (INDEPENDENT_AMBULATORY_CARE_PROVIDER_SITE_OTHER): Payer: Medicare HMO | Admitting: Physician Assistant

## 2024-02-02 ENCOUNTER — Ambulatory Visit (INDEPENDENT_AMBULATORY_CARE_PROVIDER_SITE_OTHER)
Admission: RE | Admit: 2024-02-02 | Discharge: 2024-02-02 | Disposition: A | Discharge status: Home / Self Care | Payer: Medicare HMO | Source: Ambulatory Visit | Attending: Vascular Surgery

## 2024-02-02 VITALS — BP 111/60 | HR 64 | Temp 98.3°F | Resp 18 | Ht 67.0 in | Wt 145.4 lb

## 2024-02-02 DIAGNOSIS — I739 Peripheral vascular disease, unspecified: Secondary | ICD-10-CM | POA: Insufficient documentation

## 2024-02-02 DIAGNOSIS — Z95828 Presence of other vascular implants and grafts: Secondary | ICD-10-CM | POA: Diagnosis not present

## 2024-02-02 LAB — VAS US ABI WITH/WO TBI
Left ABI: 1.24
Right ABI: 1.23

## 2024-02-02 NOTE — Progress Notes (Unsigned)
 Office Note   History of Present Illness   Elizabeth Douglas is a 73 y.o. (02-06-1951) female who presents for surveillance of PAD.  She has a history of right above-knee to below-knee popliteal artery bypass with greater saphenous vein by Dr. Imogene Burn in 2015.  This was done for right lower extremity acute limb ischemia.  Since 2020, the patient has had some decreased velocities in her bypass graft.  She has previously undergone angiogram to investigate her bypass graft and imaging demonstrated that her bypass was patent without stenosis.  She returns today for follow-up.  She says that she has been doing well since her last office visit.  She denies any claudication, rest pain, or tissue loss.  She says she walks at least 2 miles a day.   Current Outpatient Medications  Medication Sig Dispense Refill   acetaminophen (TYLENOL) 500 MG tablet Take 1,000 mg by mouth at bedtime as needed for moderate pain.     aspirin EC 81 MG tablet Take 81 mg by mouth daily. Swallow whole.     Carboxymethylcellul-Glycerin (LUBRICATING EYE DROPS OP) Place 1 drop into both eyes daily.     Cholecalciferol (VITAMIN D) 50 MCG (2000 UT) tablet Take 2,000 Units by mouth daily.     phenazopyridine (PYRIDIUM) 100 MG tablet Take 1 tablet (100 mg total) by mouth 3 (three) times daily as needed for pain. 6 tablet 0   rosuvastatin (CRESTOR) 5 MG tablet Take 1 tablet (5 mg total) by mouth once a week. 13 tablet 3   Wheat Dextrin (BENEFIBER) POWD Take 1 Dose by mouth daily.      No current facility-administered medications for this visit.    REVIEW OF SYSTEMS (negative unless checked):   Cardiac:  []  Chest pain or chest pressure? []  Shortness of breath upon activity? []  Shortness of breath when lying flat? []  Irregular heart rhythm?  Vascular:  []  Pain in calf, thigh, or hip brought on by walking? []  Pain in feet at night that wakes you up from your sleep? []  Blood clot in your veins? []  Leg swelling?  Pulmonary:   []  Oxygen at home? []  Productive cough? []  Wheezing?  Neurologic:  []  Sudden weakness in arms or legs? []  Sudden numbness in arms or legs? []  Sudden onset of difficult speaking or slurred speech? []  Temporary loss of vision in one eye? []  Problems with dizziness?  Gastrointestinal:  []  Blood in stool? []  Vomited blood?  Genitourinary:  []  Burning when urinating? []  Blood in urine?  Psychiatric:  []  Major depression  Hematologic:  []  Bleeding problems? []  Problems with blood clotting?  Dermatologic:  []  Rashes or ulcers?  Constitutional:  []  Fever or chills?  Ear/Nose/Throat:  []  Change in hearing? []  Nose bleeds? []  Sore throat?  Musculoskeletal:  []  Back pain? []  Joint pain? []  Muscle pain?   Physical Examination   Vitals:   02/02/24 1003  BP: 111/60  Pulse: 64  Resp: 18  Temp: 98.3 F (36.8 C)  TempSrc: Temporal  SpO2: 98%  Weight: 145 lb 6.4 oz (66 kg)  Height: 5\' 7"  (1.702 m)   There is no height or weight on file to calculate BMI.  General:  WDWN in NAD; vital signs documented above Gait: Not observed HENT: WNL, normocephalic Pulmonary: normal non-labored breathing , without rales, rhonchi,  wheezing Cardiac: Regular Abdomen: soft, NT, no masses Skin: without rashes Vascular Exam/Pulses: Palpable DP pulses bilaterally Extremities: without ischemic changes, without gangrene , without cellulitis; without  open wounds;  Musculoskeletal: no muscle wasting or atrophy  Neurologic: A&O X 3;  No focal weakness or paresthesias are detected Psychiatric:  The pt has Normal affect.  Non-Invasive Vascular Imaging ABI (02/02/2024) R:  ABI: 1.23 (1.18),  PT: tri DP: tri TBI:  0.66 L:  ABI: 1.24 (1.22),  PT: tri DP: tri TBI: 0.63   RLE Bypass Duplex (02/02/2024) Patent right lower extremity bypass graft without stenosis.  Some velocities less than 40 cm/s, as seen previously   Medical Decision Making   Elizabeth Douglas is a 73 y.o. female  who presents for surveillance of PAD  Based on the patient's vascular studies and examination, her ABIs are stable. Her right ABI is 1.23 and left ABI is 1.24 RLE bypass graft duplex demonstrates a patent bypass without stenosis. There are continued areas of reduced velocities <40 cm/s. This has been visualized on duplex since 2020 She denies any claudication, rest pain, or tissue loss. She has palpable DP pulses bilaterally She can follow up with our office in 1 year with repeat ABIs and RLE BPG duplex   Loel Dubonnet PA-C [facility] Office: [phone]  Clinic MD: Randie Heinz

## 2024-02-03 ENCOUNTER — Encounter: Payer: Medicare HMO | Admitting: Family Medicine

## 2024-02-11 ENCOUNTER — Encounter: Payer: Self-pay | Admitting: Family Medicine

## 2024-02-11 ENCOUNTER — Ambulatory Visit (INDEPENDENT_AMBULATORY_CARE_PROVIDER_SITE_OTHER): Payer: Medicare HMO | Admitting: Family Medicine

## 2024-02-11 VITALS — BP 106/78 | HR 68 | Ht 67.0 in | Wt 143.2 lb

## 2024-02-11 DIAGNOSIS — Z0000 Encounter for general adult medical examination without abnormal findings: Secondary | ICD-10-CM | POA: Diagnosis not present

## 2024-02-11 DIAGNOSIS — I700 Atherosclerosis of aorta: Secondary | ICD-10-CM

## 2024-02-11 DIAGNOSIS — E785 Hyperlipidemia, unspecified: Secondary | ICD-10-CM | POA: Diagnosis not present

## 2024-02-11 DIAGNOSIS — R3129 Other microscopic hematuria: Secondary | ICD-10-CM | POA: Diagnosis not present

## 2024-02-11 DIAGNOSIS — E559 Vitamin D deficiency, unspecified: Secondary | ICD-10-CM | POA: Diagnosis not present

## 2024-02-11 DIAGNOSIS — I70201 Unspecified atherosclerosis of native arteries of extremities, right leg: Secondary | ICD-10-CM | POA: Diagnosis not present

## 2024-02-11 DIAGNOSIS — Z1211 Encounter for screening for malignant neoplasm of colon: Secondary | ICD-10-CM

## 2024-02-11 DIAGNOSIS — M85852 Other specified disorders of bone density and structure, left thigh: Secondary | ICD-10-CM

## 2024-02-11 LAB — CBC WITH DIFFERENTIAL/PLATELET
Basophils Absolute: 0 10*3/uL (ref 0.0–0.1)
Basophils Relative: 0.2 % (ref 0.0–3.0)
Eosinophils Absolute: 0 10*3/uL (ref 0.0–0.7)
Eosinophils Relative: 1.1 % (ref 0.0–5.0)
HCT: 43.4 % (ref 36.0–46.0)
Hemoglobin: 14.8 g/dL (ref 12.0–15.0)
Lymphocytes Relative: 42.2 % (ref 12.0–46.0)
Lymphs Abs: 1.8 10*3/uL (ref 0.7–4.0)
MCHC: 34 g/dL (ref 30.0–36.0)
MCV: 95.7 fl (ref 78.0–100.0)
Monocytes Absolute: 0.2 10*3/uL (ref 0.1–1.0)
Monocytes Relative: 5.6 % (ref 3.0–12.0)
Neutro Abs: 2.1 10*3/uL (ref 1.4–7.7)
Neutrophils Relative %: 50.9 % (ref 43.0–77.0)
Platelets: 166 10*3/uL (ref 150.0–400.0)
RBC: 4.54 Mil/uL (ref 3.87–5.11)
RDW: 12.8 % (ref 11.5–15.5)
WBC: 4.2 10*3/uL (ref 4.0–10.5)

## 2024-02-11 LAB — LIPID PANEL
Cholesterol: 161 mg/dL (ref 0–200)
HDL: 66.1 mg/dL (ref >39.00)
LDL Cholesterol: 85 mg/dL (ref 0–99)
NonHDL: 94.83
Total CHOL/HDL Ratio: 2
Triglycerides: 47 mg/dL (ref 0.0–149.0)
VLDL: 9.4 mg/dL (ref 0.0–40.0)

## 2024-02-11 LAB — COMPREHENSIVE METABOLIC PANEL
ALT: 10 U/L (ref 0–35)
AST: 22 U/L (ref 0–37)
Albumin: 4.4 g/dL (ref 3.5–5.2)
Alkaline Phosphatase: 43 U/L (ref 39–117)
BUN: 12 mg/dL (ref 6–23)
CO2: 29 meq/L (ref 19–32)
Calcium: 9.3 mg/dL (ref 8.4–10.5)
Chloride: 105 meq/L (ref 96–112)
Creatinine, Ser: 0.64 mg/dL (ref 0.40–1.20)
GFR: 89.37 mL/min (ref >60.00)
Glucose, Bld: 83 mg/dL (ref 70–99)
Potassium: 4.1 meq/L (ref 3.5–5.1)
Sodium: 141 meq/L (ref 135–145)
Total Bilirubin: 0.5 mg/dL (ref 0.2–1.2)
Total Protein: 7.1 g/dL (ref 6.0–8.3)

## 2024-02-11 LAB — URINALYSIS, ROUTINE W REFLEX MICROSCOPIC
Bilirubin Urine: NEGATIVE
Hgb urine dipstick: NEGATIVE
Ketones, ur: NEGATIVE
Leukocytes,Ua: NEGATIVE
Nitrite: NEGATIVE
Specific Gravity, Urine: 1.01 (ref 1.000–1.030)
Total Protein, Urine: NEGATIVE
Urine Glucose: NEGATIVE
Urobilinogen, UA: 0.2 (ref 0.0–1.0)
pH: 7 (ref 5.0–8.0)

## 2024-02-11 LAB — VITAMIN D 25 HYDROXY (VIT D DEFICIENCY, FRACTURES): VITD: 40.35 ng/mL (ref 30.00–100.00)

## 2024-02-11 NOTE — Progress Notes (Signed)
 Phone [phone]   Subjective:  Patient presents today for their annual physical. Chief complaint-noted.   See problem oriented charting- ROS- full  review of systems was completed and negative Per full ROS sheet completed by patient  The following were reviewed and entered/updated in epic: Past Medical History:  Diagnosis Date   Allergy 2016 and 2020   possible fish allergy and contast   Anal fissure    Arthritis    hip   Atypical chest pain    Basal cell carcinoma    Skin cancer face   Blood transfusion without reported diagnosis 11/29/2015   after surgery   Complication of anesthesia    BP drops and passes out  n[place] vomiting   DVT (deep venous thrombosis) (HCC)    Right Leg   Excess or deficiency of vitamin D    Gallbladder polyp    GERD (gastroesophageal reflux disease)    Hemorrhoids    Microhematuria    pt. denies   Osteopenia    Peripheral arterial disease (HCC)    Peripheral vascular disease (HCC)    PONV (postoperative nausea and vomiting)    Thrombocytopenia (HCC)    Patient Active Problem List   Diagnosis Date Noted   Popliteal artery occlusion, right (HCC) 11/29/2015    Priority: High   Aortic atherosclerosis (HCC) 06/04/2021    Priority: Medium    Vitamin D deficiency 05/20/2018    Priority: Medium    History of skin cancer 04/22/2016    Priority: Medium    Hyperlipidemia 04/22/2016    Priority: Medium    Osteopenia of left femoral neck 09/18/2008    Priority: Medium    GERD 07/10/2008    Priority: Medium    Dupuytren's contracture of left hand 10/10/2019    Priority: Low   OA (osteoarthritis) of hip 02/16/2018    Priority: Low   Lumbar radiculopathy 11/20/2015    Priority: Low   Gluteal tendinitis of left buttock 11/14/2015    Priority: Low   Tensor fascia lata syndrome 10/08/2015    Priority: Low   Hemorrhoids     Priority: Low   Thrombosed external hemorrhoid 10/19/2014    Priority: Low   Anal fissure - posterior 01/29/2014     Priority: Low   CHEST PAIN 01/10/2009    Priority: Low   BENIGN POSITIONAL VERTIGO 01/18/2008    Priority: Low   Neuropathy of finger 12/05/2021   Primary osteoarthritis of left hip 07/03/2020   Past Surgical History:  Procedure Laterality Date   ABDOMINAL AORTOGRAM W/LOWER EXTREMITY Right 12/22/2018   Procedure: ABDOMINAL AORTOGRAM W/LOWER EXTREMITY Runoff;  Surgeon: Maeola Harman, MD;  Location: [facility] INVASIVE CV LAB;  Service: Cardiovascular;  Laterality: Right;   COLONOSCOPY  2005, 2015   INTRAOPERATIVE ARTERIOGRAM Right 11/29/2015   Procedure: INTRA OPERATIVE ARTERIOGRAM;  Surgeon: Fransisco Hertz, MD;  Location: [facility] OR;  Service: Vascular;  Laterality: Right;   JOINT REPLACEMENT  02/16/2018 09/0/2021   right total hip Aluisio 02-16-18   LUNG BIOPSY     THROMBECTOMY FEMORAL ARTERY Right 11/29/2015   Procedure:  RIGHT  POPLITEAL to anterior tibial and posterior tibial ARTERY thrombectomy. Right above knee to below knee popliteal artery bypass;  Surgeon: Fransisco Hertz, MD;  Location: [facility] OR;  Service: Vascular;  Laterality: Right;   TOTAL HIP ARTHROPLASTY Right 02/16/2018   Procedure: RIGHT TOTAL HIP ARTHROPLASTY ANTERIOR APPROACH;  Surgeon: Ollen Gross, MD;  Location: [facility] ORS;  Service: Orthopedics;  Laterality: Right;   TOTAL  HIP ARTHROPLASTY Left 07/03/2020   Procedure: TOTAL HIP ARTHROPLASTY ANTERIOR APPROACH;  Surgeon: Ollen Gross, MD;  Location: [facility] ORS;  Service: Orthopedics;  Laterality: Left;    UPPER GASTROINTESTINAL ENDOSCOPY  2009    Family History  Problem Relation Age of Onset   COPD Mother    Osteoporosis Mother    Varicose Veins Mother    Prostate cancer Father    Cancer Father    Heart disease Brother 61       Before age 75   Heart attack Brother 56       Massive   Cancer Brother    Lymphoma Brother        non and hodgkins   Cancer Brother    Heart disease Brother    Coronary artery disease Brother    Heart disease Brother    Diabetes  Daughter    Colon cancer Neg Hx    Colon polyps Neg Hx    Rectal cancer Neg Hx    Stomach cancer Neg Hx     Medications- reviewed and updated Current Outpatient Medications  Medication Sig Dispense Refill   acetaminophen (TYLENOL) 500 MG tablet Take 1,000 mg by mouth at bedtime as needed for moderate pain.     aspirin EC 81 MG tablet Take 81 mg by mouth daily. Swallow whole.     Carboxymethylcellul-Glycerin (LUBRICATING EYE DROPS OP) Place 1 drop into both eyes daily.     Cholecalciferol (VITAMIN D) 50 MCG (2000 UT) tablet Take 2,000 Units by mouth daily.     phenazopyridine (PYRIDIUM) 100 MG tablet Take 1 tablet (100 mg total) by mouth 3 (three) times daily as needed for pain. 6 tablet 0   rosuvastatin (CRESTOR) 5 MG tablet Take 1 tablet (5 mg total) by mouth once a week. 13 tablet 3   Wheat Dextrin (BENEFIBER) POWD Take 1 Dose by mouth daily.      No current facility-administered medications for this visit.    Allergies-reviewed and updated Allergies  Allergen Reactions   Fish Allergy Anaphylaxis and Swelling    Pt felt like throat was closing last time she ate    Iodixanol Rash    Contrast Dye     Social History   Social History Narrative   Married. 1 daughter. No grandkids. 1 granddog and 1 grandcat.       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       Hobbies: walking 2.5 miles  [place], stays active   Objective  Objective:  BP 106/78   Pulse 68   Ht 5\' 7"  (1.702 m)   Wt 143 lb 3.2 oz (65 kg)   SpO2 99%   BMI 22.43 kg/m  Gen: NAD, resting comfortably HEENT: Mucous membranes are moist. Oropharynx normal Neck: no thyromegaly CV: RRR no murmurs rubs or gallops Lungs: CTAB no crackles, wheeze, rhonchi Abdomen: soft/nontender/nondistended/normal bowel sounds. No rebound or guarding.  Ext: no edema Skin: warm, dry Neuro: grossly normal, moves all extremities, PERRLA   Assessment and Plan   73 y.o. female  presenting for annual physical.  Health Maintenance counseling: 1. Anticipatory guidance: Patient counseled regarding regular dental exams -q6 months, eye exams - yearly,  avoiding smoking and second hand smoke , limiting alcohol to 1 beverage per day- doesn't drink , no illicit drugs .   2. Risk factor reduction:  Advised patient of need for regular exercise and diet rich and fruits and vegetables  to reduce risk of heart attack and stroke.  Exercise- 2.5 miles a day walking for 5 days a week.  Diet/weight management-reasonably healthy diet.  Wt Readings from Last 3 Encounters:  02/11/24 143 lb 3.2 oz (65 kg)  02/02/24 145 lb 6.4 oz (66 kg)  01/03/24 142 lb 6 oz (64.6 kg)   3. Immunizations/screenings/ancillary studies- holding off on COVID shots.  Immunization History  Administered Date(s) Administered   Fluad Quad(high Dose 65+) 09/06/2022   Influenza Whole 09/19/2007   Influenza, High Dose Seasonal PF 09/09/2018, 08/22/2019   Influenza,inj,Quad PF,6+ Mos 09/26/2013, 08/30/2014, 09/04/2015, 09/13/2017   Influenza,inj,quad, With Preservative 09/09/2018   Influenza-Unspecified 09/22/2017, 09/09/2018, 08/22/2019, 09/22/2021   Moderna SARS-COV2 Booster Vaccination 10/30/2020   Moderna Sars-Covid-2 Vaccination 01/03/2020, 02/01/2020   PFIZER(Purple Top)SARS-COV-2 Vaccination 10/30/2020   PNEUMOCOCCAL CONJUGATE-20 04/29/2021   Pneumococcal-Unspecified 03/31/2021, 04/23/2021   Td 05/12/2004   Tdap 12/01/2007, 04/22/2016   Unspecified SARS-COV-2 Vaccination 10/30/2020   Zoster Recombinant(Shingrix) 07/12/2022, 10/27/2022   Zoster, Live 11/14/2014  4. Cervical cancer screening- follows with Dr Juliene Pina- past age based screening recommendations but still sees yarly 5. Breast cancer screening-  breast exam with gyn and mammogram 05/28/23- plans another in june 6. Colon cancer screening - no known family history of precancerous polyps or colon cancer- considered cologuard but we opted for  colonoscopy- if normal this would be her last one. Normal 2005 and 2015.  7. Skin cancer screening- sees Dr. Emily Filbert with history skin cancer. advised regular sunscreen use. Denies worrisome, changing, or new skin lesions.  8. Birth control/STD check- postmenopausal and monogamous 9. Osteoporosis screening at 59- see discussion below 10. Smoking associated screening - never smoker  Status of chronic or acute concerns   #Popliteal artery occlusion, right-follows with Dr. Randie Heinz of vascular surgery #hyperlipidemia  with ct cardiac score of 8 #aortic atherosclerosis-lpa not elevated  S: Medication:rosuvastatin 5mg  weekly.  On aspirin 81 mg due to history of popliteal artery occlusion. Also takes fiber supplement   Patient with history of bypass surgery and had catheterization not requiring intervention as recently as December 22, 2018.  Most recent imaging has showed some diminished velocities but comparable to prior-patient continues ABIs/duplex with plan for 1 year follo wup  to remain on aspirin-they have not recommended statin- no claudication still  -stable visit 02/02/24 with vascular   A/P: popliteal artery occlusion -stable with recent vascular visit  lipids hair above ideal goal for coronary artery calcium score (though low) and aortic atherosclerosis - prefer under 70 and she's in the 80's- ants to check today and from discussion If we were going to try to get LDL under 70 -could try zetia 10 mg daily -See if fiber supplement has psyllium - could try that as well   #Vitamin D deficiency S: Medication: 2000 units daily Last vitamin D Lab Results  Component Value Date   VD25OH 50.50 05/06/2022  A/P: hopefully stable- update vitamin D today. Continue current meds for now      # Low Bone density (formerly osteopenia) S: Last DEXA:worsened in july 2023 to -2.3 in lumbar spine and -2.4 in wrist  Calcium: 1200mg  (through diet ok) recommended - wants to try diet At least Vitamin D: 1000  units a day recommended-she continues on 2000 units    A/P: low bone density noted- wants to work on weight bearing exercise  and continue D and focus on calcium and recheck - reorder next visit  #History of thrombocytopenia- mild intermittent issues-has seen hematology in the  past with no further work-up recommended.- normal on last check  Lab Results  Component Value Date   WBC 5.3 06/15/2022   HGB 14.1 06/15/2022   HCT 41.6 06/15/2022   MCV 94.8 06/15/2022   PLT 168.0 06/15/2022   #hx bilateral hip replacement- doing well   Recommended follow up: Return in about 1 year (around 02/10/2025) for physical or sooner if needed.Schedule b4 you leave. Future Appointments  Date Time Provider Department Center  05/22/2024  9:45 AM [facility] ANNUAL WELLNESS VISIT 1 [facility]   Lab/Order associations: fasting   ICD-10-CM   1. Preventative health care  Z00.00     2. Popliteal artery occlusion, right (HCC)  I70.201     3. Osteopenia of left femoral neck  M85.852     4. Hyperlipidemia, unspecified hyperlipidemia type  E78.5 Comprehensive metabolic panel    CBC with Differential/Platelet    Lipid panel    Urinalysis, Routine w reflex microscopic    5. Vitamin D deficiency  E55.9 VITAMIN D 25 Hydroxy (Vit-D Deficiency, Fractures)    6. Aortic atherosclerosis (HCC)  I70.0     7. Screen for colon cancer  Z12.11 Ambulatory referral to Gastroenterology    8. Microscopic hematuria  R31.29 Urinalysis, Routine w reflex microscopic      No orders of the defined types were placed in this encounter.   Return precautions advised.  Tana Conch, MD

## 2024-02-11 NOTE — Patient Instructions (Addendum)
 [facility] contact Please call to schedule visit if you don't hear within a week Address: [address], [place], [place] [address] Phone: [phone]   If we were going to try to get LDL under 70 -could try zetia 10 mg daily -See if fiber supplement has psyllium - could try that as well  Target 1200mg  a day calcium through diet  Please stop by lab before you go If you have mychart- we will send your results within 3 business days of [place] receiving them.  If you do not have mychart- we will call you about results within 5 business days of [place] receiving them.  *please also note that you will see labs on mychart as soon as they post. I will later go in and write notes on them- will say "notes from Dr. Durene Cal"   Recommended follow up: Return in about 1 year (around 02/10/2025) for physical or sooner if needed.Schedule b4 you leave.

## 2024-02-23 ENCOUNTER — Encounter: Payer: Self-pay | Admitting: Internal Medicine

## 2024-02-29 DIAGNOSIS — D2272 Melanocytic nevi of left lower limb, including hip: Secondary | ICD-10-CM | POA: Diagnosis not present

## 2024-02-29 DIAGNOSIS — L820 Inflamed seborrheic keratosis: Secondary | ICD-10-CM | POA: Diagnosis not present

## 2024-02-29 DIAGNOSIS — L814 Other melanin hyperpigmentation: Secondary | ICD-10-CM | POA: Diagnosis not present

## 2024-02-29 DIAGNOSIS — D225 Melanocytic nevi of trunk: Secondary | ICD-10-CM | POA: Diagnosis not present

## 2024-02-29 DIAGNOSIS — L821 Other seborrheic keratosis: Secondary | ICD-10-CM | POA: Diagnosis not present

## 2024-02-29 DIAGNOSIS — L578 Other skin changes due to chronic exposure to nonionizing radiation: Secondary | ICD-10-CM | POA: Diagnosis not present

## 2024-02-29 DIAGNOSIS — R202 Paresthesia of skin: Secondary | ICD-10-CM | POA: Diagnosis not present

## 2024-02-29 DIAGNOSIS — D2262 Melanocytic nevi of left upper limb, including shoulder: Secondary | ICD-10-CM | POA: Diagnosis not present

## 2024-02-29 DIAGNOSIS — L570 Actinic keratosis: Secondary | ICD-10-CM | POA: Diagnosis not present

## 2024-02-29 DIAGNOSIS — Z808 Family history of malignant neoplasm of other organs or systems: Secondary | ICD-10-CM | POA: Diagnosis not present

## 2024-02-29 DIAGNOSIS — Z85828 Personal history of other malignant neoplasm of skin: Secondary | ICD-10-CM | POA: Diagnosis not present

## 2024-03-15 ENCOUNTER — Encounter

## 2024-03-31 ENCOUNTER — Ambulatory Visit (AMBULATORY_SURGERY_CENTER)

## 2024-03-31 VITALS — Ht 67.0 in

## 2024-03-31 DIAGNOSIS — Z1211 Encounter for screening for malignant neoplasm of colon: Secondary | ICD-10-CM

## 2024-03-31 MED ORDER — NA SULFATE-K SULFATE-MG SULF 17.5-3.13-1.6 GM/177ML PO SOLN: 1.0000 | Freq: Once | ORAL | 0 refills | Status: AC

## 2024-03-31 NOTE — Progress Notes (Signed)

## 2024-04-05 ENCOUNTER — Encounter: Admitting: Internal Medicine

## 2024-04-07 ENCOUNTER — Encounter: Payer: Self-pay | Admitting: Internal Medicine

## 2024-04-20 NOTE — Progress Notes (Signed)
 [facility] History and Physical   Primary Care Physician:  Almira Jaeger, MD   Reason for Procedure:  Colon cancer screening  Plan:    Colonoscopy     HPI: Elizabeth Douglas is a 73 y.o. female here for a repeat screening colonoscopy.  In 2015 exam showed internal hemorrhoids, performed by Dr. Howard Macho.  No polyps no cancer.  I had seen her for rectal bleeding and hemorrhoid problems, and ended up treating her for anal fissure in 2015-2016.   Past Medical History:  Diagnosis Date   Allergy 2016 and 2020   possible fish allergy and contast   Anal fissure    Arthritis    hip   Atypical chest pain    Basal cell carcinoma    Skin cancer face   Blood transfusion without reported diagnosis 11/29/2015   after surgery   Complication of anesthesia    BP drops and passes out  naseau vomiting   DVT (deep venous thrombosis) (HCC)    Right Leg   Excess or deficiency of vitamin D     Gallbladder polyp    GERD (gastroesophageal reflux disease)    Hemorrhoids    Microhematuria    pt. denies   Osteopenia    Peripheral arterial disease (HCC)    Peripheral vascular disease (HCC)    PONV (postoperative nausea and vomiting)    Thrombocytopenia (HCC)     Past Surgical History:  Procedure Laterality Date   ABDOMINAL AORTOGRAM W/LOWER EXTREMITY Right 12/22/2018   Procedure: ABDOMINAL AORTOGRAM W/LOWER EXTREMITY Runoff;  Surgeon: Adine Hoof, MD;  Location: [facility] INVASIVE CV LAB;  Service: Cardiovascular;  Laterality: Right;   COLONOSCOPY  2005, 2015   INTRAOPERATIVE ARTERIOGRAM Right 11/29/2015   Procedure: INTRA OPERATIVE ARTERIOGRAM;  Surgeon: Arvil Lauber, MD;  Location: [facility] OR;  Service: Vascular;  Laterality: Right;   JOINT REPLACEMENT  02/16/2018 09/0/2021   right total hip Aluisio 02-16-18   LUNG BIOPSY     THROMBECTOMY FEMORAL ARTERY Right 11/29/2015   Procedure:  RIGHT  POPLITEAL to anterior tibial and posterior tibial ARTERY thrombectomy. Right above  knee to below knee popliteal artery bypass;  Surgeon: Arvil Lauber, MD;  Location: [facility] OR;  Service: Vascular;  Laterality: Right;   TOTAL HIP ARTHROPLASTY Right 02/16/2018   Procedure: RIGHT TOTAL HIP ARTHROPLASTY ANTERIOR APPROACH;  Surgeon: Liliane Rei, MD;  Location: [facility] ORS;  Service: Orthopedics;  Laterality: Right;   TOTAL HIP ARTHROPLASTY Left 07/03/2020   Procedure: TOTAL HIP ARTHROPLASTY ANTERIOR APPROACH;  Surgeon: Liliane Rei, MD;  Location: [facility] ORS;  Service: Orthopedics;  Laterality: Left;    UPPER GASTROINTESTINAL ENDOSCOPY  2009    Prior to Admission medications   Medication Sig Start Date End Date Taking? Authorizing Provider  acetaminophen  (TYLENOL ) 500 MG tablet Take 1,000 mg by mouth at bedtime as needed for moderate pain.    [provider]  aspirin  EC 81 MG tablet Take 81 mg by mouth daily. Swallow whole.    [provider]  Carboxymethylcellul-Glycerin (LUBRICATING EYE DROPS OP) Place 1 drop into both eyes daily.    [provider]  Cholecalciferol (VITAMIN D ) 50 MCG (2000 UT) tablet Take 2,000 Units by mouth daily.    [provider]  phenazopyridine  (PYRIDIUM ) 100 MG tablet Take 1 tablet (100 mg total) by mouth 3 (three) times daily as needed for pain. Patient not taking: Reported on 03/31/2024 01/03/24   Christel Cousins, MD  rosuvastatin  (CRESTOR ) 5 MG tablet Take  1 tablet (5 mg total) by mouth once a week. 09/14/23   Almira Jaeger, MD  Wheat Dextrin (BENEFIBER) POWD Take 1 Dose by mouth daily.     [provider]    Current Outpatient Medications  Medication Sig Dispense Refill   aspirin  EC 81 MG tablet Take 81 mg by mouth daily. Swallow whole.     Carboxymethylcellul-Glycerin (LUBRICATING EYE DROPS OP) Place 1 drop into both eyes daily.     Cholecalciferol (VITAMIN D ) 50 MCG (2000 UT) tablet Take 2,000 Units by mouth daily.     rosuvastatin  (CRESTOR ) 5 MG tablet Take 1 tablet (5 mg total) by mouth once a  week. 13 tablet 3   Wheat Dextrin (BENEFIBER) POWD Take 1 Dose by mouth daily.      acetaminophen  (TYLENOL ) 500 MG tablet Take 1,000 mg by mouth at bedtime as needed for moderate pain.     phenazopyridine  (PYRIDIUM ) 100 MG tablet Take 1 tablet (100 mg total) by mouth 3 (three) times daily as needed for pain. (Patient not taking: Reported on 04/21/2024) 6 tablet 0   Current Facility-Administered Medications  Medication Dose Route Frequency Provider Last Rate Last Admin   0.9 %  sodium chloride  infusion  500 mL Intravenous Once Kenney Peacemaker, MD        Allergies as of 04/21/2024 - Review Complete 04/21/2024  Allergen Reaction Noted   Fish allergy Anaphylaxis and Swelling 02/16/2018   Iodixanol  Rash 02/13/2019    Family History  Problem Relation Age of Onset   COPD Mother    Osteoporosis Mother    Varicose Veins Mother    Prostate cancer Father    Cancer Father    Heart disease Brother 6       Before age 31   Heart attack Brother 44       Massive   Cancer Brother    Lymphoma Brother        non and hodgkins   Cancer Brother    Heart disease Brother    Coronary artery disease Brother    Heart disease Brother    Diabetes Daughter    Colon cancer Neg Hx    Colon polyps Neg Hx    Rectal cancer Neg Hx    Stomach cancer Neg Hx     Social History   Socioeconomic History   Marital status: Married    Spouse name: Not on file   Number of children: 1   Years of education: Not on file   Highest education level: 12th grade  Occupational History   Occupation: Retired  Tobacco Use   Smoking status: Never   Smokeless tobacco: Never  Vaping Use   Vaping status: Never Used  Substance and Sexual Activity   Alcohol use: No   Drug use: No   Sexual activity: Yes    Birth control/protection: None  Other Topics Concern   Not on file  Social History Narrative   Married. 1 daughter. No grandkids. 1 granddog and 1 grandcat.       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       Hobbies: walking 2.5 miles  [place], stays active   Social Drivers of Health   Financial Resource Strain: Low Risk  (01/03/2024)   Overall Financial Resource Strain (CARDIA)    Difficulty of Paying Living Expenses: Not hard at all  Food Insecurity: No Food Insecurity (01/03/2024)   Hunger Vital Sign  Worried About Programme researcher, broadcasting/film/video in the Last Year: Never true    Ran Out of Food in the Last Year: Never true  Transportation Needs: No Transportation Needs (01/03/2024)   PRAPARE - Administrator, Civil Service (Medical): No    Lack of Transportation (Non-Medical): No  Physical Activity: Sufficiently Active (01/03/2024)   Exercise Vital Sign    Days of Exercise per Week: 5 days    Minutes of Exercise per Session: 40 min  Stress: No Stress Concern Present (01/03/2024)   [employer] of Occupational Health - Occupational Stress Questionnaire    Feeling of Stress : Not at all  Social Connections: Socially Integrated (01/03/2024)   Social Connection and Isolation Panel [NHANES]    Frequency of Communication with Friends and Family: More than three times a week    Frequency of Social Gatherings with Friends and Family: Once a week    Attends Religious Services: More than 4 times per year    Active Member of [employer] or Organizations: Yes    Attends Banker Meetings: Never    Marital Status: Married  Catering manager Violence: Not At Risk (05/10/2023)   Humiliation, Afraid, Rape, and Kick questionnaire    Fear of Current or Ex-Partner: No    Emotionally Abused: No    Physically Abused: No    Sexually Abused: No    Review of Systems:  All other review of systems negative except as mentioned in the HPI.  Physical Exam: Vital signs BP (!) 128/110   Pulse 61   Temp (!) 97.5 F (36.4 C) (Temporal)   Resp 18   Ht 5\' 7"  (1.702 m)   Wt 136 lb (61.7 kg)   SpO2 100%   BMI 21.30 kg/m   General:   Alert,   Well-developed, well-nourished, pleasant and cooperative in NAD Lungs:  Clear throughout to auscultation.   Heart:  Regular rate and rhythm; no murmurs, clicks, rubs,  or gallops. Abdomen:  Soft, nontender and nondistended. Normal bowel sounds.   Neuro/Psych:  Alert and cooperative. Normal mood and affect. A and O x 3   @Rita Vialpando  Tammie Fall, MD, [facility] [phone] (pager) 04/21/2024 8:12 AM@

## 2024-04-21 ENCOUNTER — Encounter: Payer: Self-pay | Admitting: Internal Medicine

## 2024-04-21 ENCOUNTER — Ambulatory Visit (AMBULATORY_SURGERY_CENTER): Admitting: Internal Medicine

## 2024-04-21 VITALS — BP 106/65 | HR 55 | Temp 97.5°F | Resp 17 | Ht 67.0 in | Wt 136.0 lb

## 2024-04-21 DIAGNOSIS — K644 Residual hemorrhoidal skin tags: Secondary | ICD-10-CM | POA: Diagnosis not present

## 2024-04-21 DIAGNOSIS — Z1211 Encounter for screening for malignant neoplasm of colon: Secondary | ICD-10-CM | POA: Diagnosis not present

## 2024-04-21 DIAGNOSIS — K648 Other hemorrhoids: Secondary | ICD-10-CM | POA: Diagnosis not present

## 2024-04-21 DIAGNOSIS — K5730 Diverticulosis of large intestine without perforation or abscess without bleeding: Secondary | ICD-10-CM

## 2024-04-21 DIAGNOSIS — D122 Benign neoplasm of ascending colon: Secondary | ICD-10-CM

## 2024-04-21 MED ORDER — SODIUM CHLORIDE 0.9 % IV SOLN: 500.0000 mL | Freq: Once | INTRAVENOUS | Status: DC

## 2024-04-21 NOTE — Progress Notes (Signed)
 Pt's states no medical or surgical changes since previsit or office visit.

## 2024-04-21 NOTE — Patient Instructions (Addendum)
 I found and removed one tiny polyp.  Also saw diverticulosis and hemorrhoids.  I will let you know pathology results and when/if to have another routine colonoscopy by mail and/or My Chart.  I appreciate the opportunity to care for you. Kenney Peacemaker, MD, [facility]  Please see handouts regarding Polyps, Hemorrhoids, and Diverticulosis.  YOU HAD AN ENDOSCOPIC PROCEDURE TODAY AT THE [facility]:   Refer to the procedure report that was given to you for any specific questions about what was found during the examination.  If the procedure report does not answer your questions, please call your gastroenterologist to clarify.  If you requested that your care partner not be given the details of your procedure findings, then the procedure report has been included in a sealed envelope for you to review at your convenience later.  YOU SHOULD EXPECT: Some feelings of bloating in the abdomen. Passage of more gas than usual.  Walking can help get rid of the air that was put into your GI tract during the procedure and reduce the bloating. If you had a lower endoscopy (such as a colonoscopy or flexible sigmoidoscopy) you may notice spotting of blood in your stool or on the toilet paper. If you underwent a bowel prep for your procedure, you may not have a normal bowel movement for a few days.  Please Note:  You might notice some irritation and congestion in your nose or some drainage.  This is from the oxygen used during your procedure.  There is no need for concern and it should clear up in a day or so.  SYMPTOMS TO REPORT IMMEDIATELY:  Following lower endoscopy (colonoscopy or flexible sigmoidoscopy):  Excessive amounts of blood in the stool  Significant tenderness or worsening of abdominal pains  Swelling of the abdomen that is new, acute  Fever of 100F or higher  For urgent or emergent issues, a gastroenterologist can be reached at any hour by calling [phone]. Do not use MyChart  messaging for urgent concerns.    DIET:  We do recommend a small meal at first, but then you may proceed to your regular diet.  Drink plenty of fluids but you should avoid alcoholic beverages for 24 hours.  ACTIVITY:  You should plan to take it easy for the rest of today and you should NOT DRIVE or use heavy machinery until tomorrow (because of the sedation medicines used during the test).    FOLLOW UP: Our staff will call the number listed on your records the next business day following your procedure.  We will call around 7:15- 8:00 am to check on you and address any questions or concerns that you may have regarding the information given to you following your procedure. If we do not reach you, we will leave a message.     If any biopsies were taken you will be contacted by phone or by letter within the next 1-3 weeks.  Please call us  at [phone] if you have not heard about the biopsies in 3 weeks.    SIGNATURES/CONFIDENTIALITY: You and/or your care partner have signed paperwork which will be entered into your electronic medical record.  These signatures attest to the fact that that the information above on your After Visit Summary has been reviewed and is understood.  Full responsibility of the confidentiality of this discharge information lies with you and/or your care-partner.

## 2024-04-21 NOTE — Progress Notes (Signed)
 Report given to PACU, vss

## 2024-04-21 NOTE — Progress Notes (Signed)
 Called to room to assist during endoscopic procedure.  Patient ID and intended procedure confirmed with present staff. Received instructions for my participation in the procedure from the performing physician.

## 2024-04-21 NOTE — Op Note (Signed)
 [facility] Patient Name: Elizabeth Douglas Procedure Date: 04/21/2024 7:54 AM MRN: [id number] Endoscopist: Kenney Peacemaker , MD, [id number] Age: 73 Referring MD:  Date of Birth: Jun 13, 1951 Gender: Female Account #: [id number] Procedure:                Colonoscopy Indications:              Screening for colorectal malignant neoplasm, Last                            colonoscopy: 2015 Medicines:                Monitored Anesthesia Care Procedure:                Pre-Anesthesia Assessment:                           - Prior to the procedure, a History and Physical                            was performed, and patient medications and                            allergies were reviewed. The patient's tolerance of                            previous anesthesia was also reviewed. The risks                            and benefits of the procedure and the sedation                            options and risks were discussed with the patient.                            All questions were answered, and informed consent                            was obtained. Prior Anticoagulants: The patient has                            taken no anticoagulant or antiplatelet agents. ASA                            Grade Assessment: III - A patient with severe                            systemic disease. After reviewing the risks and                            benefits, the patient was deemed in satisfactory                            condition to undergo the procedure.  After obtaining informed consent, the colonoscope                            was passed under direct vision. Throughout the                            procedure, the patient's blood pressure, pulse, and                            oxygen saturations were monitored continuously. The                            Olympus Scope [id number] was introduced through the                            anus and advanced to the the cecum,  identified by                            appendiceal orifice and ileocecal valve. The                            colonoscopy was performed without difficulty. The                            patient tolerated the procedure well. The quality                            of the bowel preparation was excellent. The bowel                            preparation used was SUPREP via split dose                            instruction. The ileocecal valve, appendiceal                            orifice, and rectum were photographed. Scope In: 8:18:31 AM Scope Out: 8:32:31 AM Scope Withdrawal Time: 0 hours 10 minutes 53 seconds  Total Procedure Duration: 0 hours 14 minutes 0 seconds  Findings:                 The perianal and digital rectal examinations were                            normal.                           A 3 mm polyp was found in the ascending colon. The                            polyp was sessile. The polyp was removed with a                            cold snare. Resection and retrieval were complete.  Verification of patient identification for the                            specimen was done. Estimated blood loss was minimal.                           Multiple diverticula were found in the sigmoid                            colon.                           External and internal hemorrhoids were found.                           The exam was otherwise without abnormality on                            direct and retroflexion views. Complications:            No immediate complications. Estimated Blood Loss:     Estimated blood loss was minimal. Impression:               - One 3 mm polyp in the ascending colon, removed                            with a cold snare. Resected and retrieved.                           - Diverticulosis in the sigmoid colon.                           - External and internal hemorrhoids.                           - The examination was  otherwise normal on direct                            and retroflexion views. Recommendation:           - Patient has a contact number available for                            emergencies. The signs and symptoms of potential                            delayed complications were discussed with the                            patient. Return to normal activities tomorrow.                            Written discharge instructions were provided to the                            patient.                           -  Resume previous diet.                           - Continue present medications.                           - Await pathology results.                           - No recommendation at this time regarding repeat                            colonoscopy due to age. Kenney Peacemaker, MD 04/21/2024 8:42:31 AM This report has been signed electronically.

## 2024-04-24 ENCOUNTER — Encounter: Payer: Self-pay | Admitting: Internal Medicine

## 2024-04-25 ENCOUNTER — Telehealth: Payer: Self-pay

## 2024-04-25 NOTE — Telephone Encounter (Signed)
  Follow up Call-     04/21/2024    7:48 AM  Call back number  Post procedure Call Back phone  # [phone]  Permission to leave phone message Yes     Patient questions:  Do you have a fever, pain , or abdominal swelling? No. Pain Score  0 *  Have you tolerated food without any problems? Yes.    Have you been able to return to your normal activities? Yes.    Do you have any questions about your discharge instructions: Diet   No. Medications  No. Follow up visit  No.  Do you have questions or concerns about your Care? No.  Actions: * If pain score is 4 or above: No action needed, pain <4.

## 2024-04-26 DIAGNOSIS — Z1331 Encounter for screening for depression: Secondary | ICD-10-CM | POA: Diagnosis not present

## 2024-04-26 DIAGNOSIS — N952 Postmenopausal atrophic vaginitis: Secondary | ICD-10-CM | POA: Diagnosis not present

## 2024-04-26 DIAGNOSIS — Z01419 Encounter for gynecological examination (general) (routine) without abnormal findings: Secondary | ICD-10-CM | POA: Diagnosis not present

## 2024-04-26 DIAGNOSIS — Z124 Encounter for screening for malignant neoplasm of cervix: Secondary | ICD-10-CM | POA: Diagnosis not present

## 2024-04-26 DIAGNOSIS — Z01411 Encounter for gynecological examination (general) (routine) with abnormal findings: Secondary | ICD-10-CM | POA: Diagnosis not present

## 2024-04-27 LAB — SURGICAL PATHOLOGY

## 2024-04-28 ENCOUNTER — Ambulatory Visit: Payer: Self-pay | Admitting: Internal Medicine

## 2024-05-22 ENCOUNTER — Ambulatory Visit (INDEPENDENT_AMBULATORY_CARE_PROVIDER_SITE_OTHER): Payer: Medicare HMO

## 2024-05-22 VITALS — Ht 67.0 in | Wt 136.0 lb

## 2024-05-22 DIAGNOSIS — Z0000 Encounter for general adult medical examination without abnormal findings: Secondary | ICD-10-CM

## 2024-05-22 NOTE — Patient Instructions (Signed)
 [name] , Thank you for taking time out of your busy schedule to complete your Annual Wellness Visit with me. I enjoyed our conversation and look forward to speaking with you again next year. I, as well as your care team,  appreciate your ongoing commitment to your health goals. Please review the following plan we discussed and let me know if I can assist you in the future. Your Game plan/ To Do List    Referrals: If you haven't heard from the office you've been referred to, please reach out to them at the phone provided.   Follow up Visits: Next Medicare AWV with our clinical staff: 05/28/25   Have you seen your provider in the last 6 months (3 months if uncontrolled diabetes)? Yes Next Office Visit with your provider: 02/15/25  Clinician Recommendations:  Aim for 30 minutes of exercise or brisk walking, 6-8 glasses of water , and 5 servings of fruits and vegetables each day.       This is a list of the screening recommended for you and due dates:  Health Maintenance  Topic Date Due   COVID-19 Vaccine (5 - 2024-25 season) 08/01/2023   Medicare Annual Wellness Visit  05/09/2024   Mammogram  05/27/2024   Flu Shot  06/30/2024   DTaP/Tdap/Td vaccine (4 - Td or Tdap) 04/22/2026   Pneumococcal Vaccine for age over 58  Completed   DEXA scan (bone density measurement)  Completed   Hepatitis C Screening  Completed   Zoster (Shingles) Vaccine  Completed   HPV Vaccine  Aged Out   Meningitis B Vaccine  Aged Out   Colon Cancer Screening  Discontinued    Advanced directives: (Declined) Advance directive discussed with you today. Even though you declined this today, please call our office should you change your mind, and we can give you the proper paperwork for you to fill out. Advance Care Planning is important because it:  [x]  Makes sure you receive the medical care that is consistent with your values, goals, and preferences  [x]  It provides guidance to your family and loved ones and reduces  their decisional burden about whether or not they are making the right decisions based on your wishes.  Follow the link provided in your after visit summary or read over the paperwork we have mailed to you to help you started getting your Advance Directives in place. If you need assistance in completing these, please reach out to us  so that we can help you!  See attachments for Preventive Care and Fall Prevention Tips.

## 2024-05-22 NOTE — Progress Notes (Signed)
 Subjective:   Elizabeth Douglas is a 73 y.o. who presents for a Medicare Wellness preventive visit.  As a reminder, Annual Wellness Visits don't include a physical exam, and some assessments may be limited, especially if this visit is performed virtually. We may recommend an in-person follow-up visit with your provider if needed.  Visit Complete: Virtual I connected with  Elizabeth Douglas on 05/22/24 by a audio enabled telemedicine application and verified that I am speaking with the correct person using two identifiers.  Patient Location: Home  Provider Location: Office/Clinic  I discussed the limitations of evaluation and management by telemedicine. The patient expressed understanding and agreed to proceed.  Vital Signs: Because this visit was a virtual/telehealth visit, some criteria may be missing or patient reported. Any vitals not documented were not able to be obtained and vitals that have been documented are patient reported.  VideoDeclined- This patient declined Librarian, academic. Therefore the visit was completed with audio only.  Persons Participating in Visit: Patient.  AWV Questionnaire: Yes: Patient Medicare AWV questionnaire was completed by the patient on 05/19/24; I have confirmed that all information answered by patient is correct and no changes since this date.  Cardiac Risk Factors include: advanced age (>47men, >16 women);dyslipidemia     Objective:    Today's Vitals   05/22/24 0925  Weight: 136 lb (61.7 kg)  Height: 5' 7 (1.702 m)   Body mass index is 21.3 kg/m.     05/22/2024    9:27 AM 05/10/2023    9:37 AM 05/07/2022    9:40 AM 05/01/2021    9:31 AM 01/28/2021   10:26 AM 07/03/2020   12:53 PM 07/03/2020    6:34 AM  Advanced Directives  Does Patient Have a Medical Advance Directive? No No No No Yes No No  Would patient like information on creating a medical advance directive? No - Patient declined No - Patient declined No - Patient  declined No - Patient declined  No - Patient declined No - Patient declined    Current Medications (verified) Outpatient Encounter Medications as of 05/22/2024  Medication Sig   acetaminophen  (TYLENOL ) 500 MG tablet Take 1,000 mg by mouth at bedtime as needed for moderate pain.   aspirin  EC 81 MG tablet Take 81 mg by mouth daily. Swallow whole.   Carboxymethylcellul-Glycerin (LUBRICATING EYE DROPS OP) Place 1 drop into both eyes daily.   Cholecalciferol (VITAMIN D ) 50 MCG (2000 UT) tablet Take 2,000 Units by mouth daily.   rosuvastatin  (CRESTOR ) 5 MG tablet Take 1 tablet (5 mg total) by mouth once a week.   Wheat Dextrin (BENEFIBER) POWD Take 1 Dose by mouth daily.    phenazopyridine  (PYRIDIUM ) 100 MG tablet Take 1 tablet (100 mg total) by mouth 3 (three) times daily as needed for pain. (Patient not taking: Reported on 05/22/2024)   No facility-administered encounter medications on file as of 05/22/2024.    Allergies (verified) Fish allergy and Iodixanol    History: Past Medical History:  Diagnosis Date   Allergy 2016 and 2020   possible fish allergy and contast   Anal fissure    Arthritis    hip   Atypical chest pain    Basal cell carcinoma    Skin cancer face   Blood transfusion without reported diagnosis 11/29/2015   after surgery   Complication of anesthesia    BP drops and passes out  [place] vomiting   DVT (deep venous thrombosis) (HCC)    Right  Leg   Excess or deficiency of vitamin D     Gallbladder polyp    GERD (gastroesophageal reflux disease)    Hemorrhoids    Microhematuria    pt. denies   Osteopenia    Peripheral arterial disease (HCC)    Peripheral vascular disease (HCC)    PONV (postoperative nausea and vomiting)    Thrombocytopenia (HCC)    Past Surgical History:  Procedure Laterality Date   ABDOMINAL AORTOGRAM W/LOWER EXTREMITY Right 12/22/2018   Procedure: ABDOMINAL AORTOGRAM W/LOWER EXTREMITY [place];  Surgeon: Sheree Penne Bruckner, MD;  Location:  [facility] INVASIVE CV LAB;  Service: Cardiovascular;  Laterality: Right;   COLONOSCOPY  2005, 2015   INTRAOPERATIVE ARTERIOGRAM Right 11/29/2015   Procedure: INTRA OPERATIVE ARTERIOGRAM;  Surgeon: Redell LITTIE Door, MD;  Location: [facility] OR;  Service: Vascular;  Laterality: Right;   JOINT REPLACEMENT  02/16/2018 09/0/2021   right total hip Aluisio 02-16-18   LUNG BIOPSY     THROMBECTOMY FEMORAL ARTERY Right 11/29/2015   Procedure:  RIGHT  POPLITEAL to anterior tibial and posterior tibial ARTERY thrombectomy. Right above knee to below knee popliteal artery bypass;  Surgeon: Redell LITTIE Door, MD;  Location: [facility] OR;  Service: Vascular;  Laterality: Right;   TOTAL HIP ARTHROPLASTY Right 02/16/2018   Procedure: RIGHT TOTAL HIP ARTHROPLASTY ANTERIOR APPROACH;  Surgeon: Melodi Lerner, MD;  Location: [facility] ORS;  Service: Orthopedics;  Laterality: Right;   TOTAL HIP ARTHROPLASTY Left 07/03/2020   Procedure: TOTAL HIP ARTHROPLASTY ANTERIOR APPROACH;  Surgeon: Melodi Lerner, MD;  Location: [facility] ORS;  Service: Orthopedics;  Laterality: Left;    UPPER GASTROINTESTINAL ENDOSCOPY  2009   Family History  Problem Relation Age of Onset   COPD Mother    Osteoporosis Mother    Varicose Veins Mother    Prostate cancer Father    Cancer Father    Heart disease Brother 70       Before age 17   Heart attack Brother 46       Massive   Cancer Brother    Lymphoma Brother        non and hodgkins   Cancer Brother    Heart disease Brother    Coronary artery disease Brother    Heart disease Brother    Diabetes Daughter    Colon cancer Neg Hx    Colon polyps Neg Hx    Rectal cancer Neg Hx    Stomach cancer Neg Hx    Social History   Socioeconomic History   Marital status: Married    Spouse name: Not on file   Number of children: 1   Years of education: Not on file   Highest education level: 12th grade  Occupational History   Occupation: Retired  Tobacco Use   Smoking status: Never   Smokeless tobacco: Never  Vaping  Use   Vaping status: Never Used  Substance and Sexual Activity   Alcohol use: No   Drug use: No   Sexual activity: Yes    Birth control/protection: None  Other Topics Concern   Not on file  Social History Narrative   Married. 1 daughter. No grandkids. 1 granddog and 1 grandcat.       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       Hobbies: walking 2.5 miles  [place], stays active   Social Drivers of Health   Financial Resource Strain: Low Risk  (05/19/2024)   Overall Physicist, medical  Strain (CARDIA)    Difficulty of Paying Living Expenses: Not hard at all  Food Insecurity: No Food Insecurity (05/19/2024)   Hunger Vital Sign    Worried About Running Out of Food in the Last Year: Never true    Ran Out of Food in the Last Year: Never true  Transportation Needs: No Transportation Needs (05/19/2024)   PRAPARE - Administrator, Civil Service (Medical): No    Lack of Transportation (Non-Medical): No  Physical Activity: Sufficiently Active (05/19/2024)   Exercise Vital Sign    Days of Exercise per Week: 5 days    Minutes of Exercise per Session: 40 min  Stress: No Stress Concern Present (05/19/2024)   [employer] of Occupational Health - Occupational Stress Questionnaire    Feeling of Stress: Not at all  Social Connections: Moderately Integrated (05/19/2024)   Social Connection and Isolation Panel    Frequency of Communication with Friends and Family: More than three times a week    Frequency of Social Gatherings with Friends and Family: More than three times a week    Attends Religious Services: More than 4 times per year    Active Member of [employer] or Organizations: No    Attends Engineer, structural: Not on file    Marital Status: Married    Tobacco Counseling Counseling given: Not Answered    Clinical Intake:  Pre-visit preparation completed: Yes  Pain : No/denies pain     BMI - recorded:  21.3 Nutritional Status: BMI of 19-24  Normal Nutritional Risks: None Diabetes: No  Lab Results  Component Value Date   HGBA1C 5.6 12/01/2015   HGBA1C 5.2 10/16/2015     How often do you need to have someone help you when you read instructions, pamphlets, or other written materials from your doctor or pharmacy?: 1 - Never  Interpreter Needed?: No  Information entered by :: Elizabeth Haws, LPN   Activities of Daily Living     05/22/2024    9:26 AM  In your present state of health, do you have any difficulty performing the following activities:  Hearing? 0  Vision? 0  Difficulty concentrating or making decisions? 0  Walking or climbing stairs? 0  Dressing or bathing? 0  Doing errands, shopping? 0  Preparing Food and eating ? N  Using the Toilet? N  In the past six months, have you accidently leaked urine? N  Do you have problems with loss of bowel control? N  Managing your Medications? N  Managing your Finances? N  Housekeeping or managing your Housekeeping? N    Patient Care Team: Katrinka Garnette KIDD, MD as PCP - General (Family Medicine) Robinson Pao, MD as Consulting Physician (Dermatology) Teressa Toribio SQUIBB, MD (Inactive) as Attending Physician (Gastroenterology) Sheree Penne Bruckner, MD as Consulting Physician (Vascular Surgery) Melodi Lerner, MD as Consulting Physician (Orthopedic Surgery)  I have updated your Care Teams any recent Medical Services you may have received from other providers in the past year.     Assessment:   This is a routine wellness examination for Elizabeth Douglas.  Hearing/Vision screen Hearing Screening - Comments:: Pt denies any hearing issues  Vision Screening - Comments:: Wears rx glasses - up to date with routine eye exams with Dr Joshua @ [facility]     Goals Addressed             This Visit's Progress    Patient Stated       Maintain health and activity  Depression Screen     05/22/2024    9:28 AM 05/10/2023    9:36  AM 12/18/2022   10:34 AM 05/07/2022    9:40 AM 05/06/2022    8:06 AM 05/01/2021    9:30 AM 04/29/2021    8:11 AM  PHQ 2/9 Scores  PHQ - 2 Score 0 0 0 0 0 0 0  PHQ- 9 Score     0  0    Fall Risk     05/22/2024    9:30 AM 05/07/2023   11:57 AM 05/07/2022    9:41 AM 12/05/2021    9:46 AM 05/01/2021    9:32 AM  Fall Risk   Falls in the past year? 0 0 0 0 0  Number falls in past yr: 0 0 0  0  Injury with Fall? 0 0 0  0  Risk for fall due to : No Fall Risks Impaired vision Impaired vision No Fall Risks Impaired vision  Follow up Falls prevention discussed Falls prevention discussed Falls prevention discussed   Falls prevention discussed      Data saved with a previous flowsheet row definition    MEDICARE RISK AT HOME:  Medicare Risk at Home Any stairs in or around the home?: Yes If so, are there any without handrails?: No Home free of loose throw rugs in walkways, pet beds, electrical cords, etc?: Yes Adequate lighting in your home to reduce risk of falls?: Yes Life alert?: No Use of a cane, walker or w/c?: No Grab bars in the bathroom?: No Shower chair or bench in shower?: Yes Elevated toilet seat or a handicapped toilet?: No  TIMED UP AND GO:  Was the test performed?  No  Cognitive Function: 6CIT completed        05/22/2024    9:30 AM 05/10/2023    9:39 AM 05/07/2022    9:43 AM 05/01/2021    9:33 AM  6CIT Screen  What Year? 0 points 0 points 0 points 0 points  What month? 0 points 0 points 0 points 0 points  What time? 0 points 0 points 0 points 0 points  Count back from 20 0 points 0 points 0 points 0 points  Months in reverse 0 points 0 points 0 points 0 points  Repeat phrase 0 points 0 points 0 points 0 points  Total Score 0 points 0 points 0 points 0 points    Immunizations Immunization History  Administered Date(s) Administered   Fluad Quad(high Dose 65+) 09/06/2022   Influenza Whole 09/19/2007   Influenza, High Dose Seasonal PF 09/09/2018, 08/22/2019    Influenza,inj,Quad PF,6+ Mos 09/26/2013, 08/30/2014, 09/04/2015, 09/13/2017   Influenza,inj,quad, With Preservative 09/09/2018   Influenza-Unspecified 09/22/2017, 09/09/2018, 08/22/2019, 09/22/2021   Moderna SARS-COV2 Booster Vaccination 10/30/2020   Moderna Sars-Covid-2 Vaccination 01/03/2020, 02/01/2020   PFIZER(Purple Top)SARS-COV-2 Vaccination 10/30/2020   PNEUMOCOCCAL CONJUGATE-20 04/29/2021   Pneumococcal-Unspecified 03/31/2021, 04/23/2021   Td 05/12/2004   Tdap 12/01/2007, 04/22/2016   Unspecified SARS-COV-2 Vaccination 10/30/2020   Zoster Recombinant(Shingrix) 07/12/2022, 10/27/2022   Zoster, Live 11/14/2014    Screening Tests Health Maintenance  Topic Date Due   COVID-19 Vaccine (5 - 2024-25 season) 08/01/2023   MAMMOGRAM  05/27/2024   INFLUENZA VACCINE  06/30/2024   Medicare Annual Wellness (AWV)  05/22/2025   DTaP/Tdap/Td (4 - Td or Tdap) 04/22/2026   Pneumococcal Vaccine: 50+ Years  Completed   DEXA SCAN  Completed   Hepatitis C Screening  Completed   Zoster Vaccines- Shingrix  Completed  HPV VACCINES  Aged Out   Meningococcal B Vaccine  Aged Out   Colonoscopy  Discontinued    Health Maintenance  Health Maintenance Due  Topic Date Due   COVID-19 Vaccine (5 - 2024-25 season) 08/01/2023   Health Maintenance Items Addressed: See Nurse Notes at the end of this note  Additional Screening:  Vision Screening: Recommended annual ophthalmology exams for early detection of glaucoma and other disorders of the eye. Would you like a referral to an eye doctor? No    Dental Screening: Recommended annual dental exams for proper oral hygiene  Community Resource Referral / Chronic Care Management: CRR required this visit?  No   CCM required this visit?  No   Plan:    I have personally reviewed and noted the following in the patient's chart:   Medical and social history Use of alcohol, tobacco or illicit drugs  Current medications and supplements including  opioid prescriptions. Patient is not currently taking opioid prescriptions. Functional ability and status Nutritional status Physical activity Advanced directives List of other physicians Hospitalizations, surgeries, and ER visits in previous 12 months Vitals Screenings to include cognitive, depression, and falls Referrals and appointments  In addition, I have reviewed and discussed with patient certain preventive protocols, quality metrics, and best practice recommendations. A written personalized care plan for preventive services as well as general preventive health recommendations were provided to patient.   Elizabeth VEAR Haws, LPN   [id number]   After Visit Summary: (MyChart) Due to this being a telephonic visit, the after visit summary with patients personalized plan was offered to patient via MyChart   Notes: Nothing significant to report at this time.

## 2024-06-08 DIAGNOSIS — Z1231 Encounter for screening mammogram for malignant neoplasm of breast: Secondary | ICD-10-CM | POA: Diagnosis not present

## 2024-06-08 LAB — HM MAMMOGRAPHY

## 2024-10-16 ENCOUNTER — Ambulatory Visit (INDEPENDENT_AMBULATORY_CARE_PROVIDER_SITE_OTHER): Admitting: Family Medicine

## 2024-10-16 ENCOUNTER — Encounter: Payer: Self-pay | Admitting: Family Medicine

## 2024-10-16 VITALS — BP 108/70 | HR 77 | Temp 97.9°F | Ht 67.0 in | Wt 143.6 lb

## 2024-10-16 DIAGNOSIS — H6593 Unspecified nonsuppurative otitis media, bilateral: Secondary | ICD-10-CM | POA: Diagnosis not present

## 2024-10-16 NOTE — Progress Notes (Signed)
 Phone [phone] In person visit   Subjective:   Elizabeth Douglas is a 73 y.o. year old very pleasant female patient who presents for/with See problem oriented charting Chief Complaint  Patient presents with   Ear Fullness    Bilateral ear fullness, right is worse than the left x3 weeks on and off; has used Debrox did not help;     Past Medical History-  Patient Active Problem List   Diagnosis Date Noted   Popliteal artery occlusion, right 11/29/2015    Priority: High   Aortic atherosclerosis 06/04/2021    Priority: Medium    Vitamin D  deficiency 05/20/2018    Priority: Medium    History of skin cancer 04/22/2016    Priority: Medium    Hyperlipidemia 04/22/2016    Priority: Medium    Osteopenia of left femoral neck 09/18/2008    Priority: Medium    GERD 07/10/2008    Priority: Medium    Dupuytren's contracture of left hand 10/10/2019    Priority: Low   OA (osteoarthritis) of hip 02/16/2018    Priority: Low   Lumbar radiculopathy 11/20/2015    Priority: Low   Gluteal tendinitis of left buttock 11/14/2015    Priority: Low   Tensor fascia lata syndrome 10/08/2015    Priority: Low   Hemorrhoids     Priority: Low   Thrombosed external hemorrhoid 10/19/2014    Priority: Low   Anal fissure - posterior 01/29/2014    Priority: Low   CHEST PAIN 01/10/2009    Priority: Low   BENIGN POSITIONAL VERTIGO 01/18/2008    Priority: Low   Neuropathy of finger 12/05/2021   Primary osteoarthritis of left hip 07/03/2020    Medications- reviewed and updated Current Outpatient Medications  Medication Sig Dispense Refill   acetaminophen  (TYLENOL ) 500 MG tablet Take 1,000 mg by mouth at bedtime as needed for moderate pain.     aspirin  EC 81 MG tablet Take 81 mg by mouth daily. Swallow whole.     Carboxymethylcellul-Glycerin (LUBRICATING EYE DROPS OP) Place 1 drop into both eyes daily.     Cholecalciferol (VITAMIN D ) 50 MCG (2000 UT) tablet Take 2,000 Units by mouth daily.      rosuvastatin  (CRESTOR ) 5 MG tablet Take 1 tablet (5 mg total) by mouth once a week. 13 tablet 3   Wheat Dextrin (BENEFIBER) POWD Take 1 Dose by mouth daily.      phenazopyridine  (PYRIDIUM ) 100 MG tablet Take 1 tablet (100 mg total) by mouth 3 (three) times daily as needed for pain. (Patient not taking: Reported on 10/16/2024) 6 tablet 0   No current facility-administered medications for this visit.     Objective:  BP 108/70 (BP Location: Left Arm, Patient Position: Sitting, Cuff Size: Normal)   Pulse 77   Temp 97.9 F (36.6 C) (Temporal)   Ht 5' 7 (1.702 m)   Wt 143 lb 9.6 oz (65.1 kg)   SpO2 95%   BMI 22.49 kg/m  Gen: NAD, resting comfortably Tympanic membrane normal bilaterally     Assessment and Plan   # Bilateral ear fullness S:3 weeks of symptoms on and off.  The right seems worse than the left.  Debrox has been tried without benefit. No crackling. No hearing loss other than when stopped up. When feels open again can hear again. No discharge.   Ongoing runny nose this time of year common for her. No allergy treatment  Mno active symptoms today A/P: bilateral ear fullness that is coming and  going in context of ongoing allergies- suspect Otitis Media with effusion. We opted to trial over the counter Flonase 1 spray each nostril daily for 2 weeks to see if we can calm this down- can stop after that if gone and restart if recurs. If worsening pattern could refer to ENT or relook in the ears to see if any changes. Her ear canals were perfectly clear today.      Recommended follow up: Return for as needed for new, worsening, persistent symptoms. Future Appointments  Date Time Provider Department Center  02/15/2025  8:00 AM Katrinka Garnette KIDD, MD [facility]  05/28/2025  9:20 AM [facility] ANNUAL WELLNESS VISIT 1 [facility] [place]    Lab/Order associations:   ICD-10-CM   1. Bilateral otitis media with effusion  H65.93      Return precautions advised.  Garnette Katrinka, MD

## 2024-10-16 NOTE — Patient Instructions (Addendum)
 bilateral ear fullness that is coming and going in context of ongoing allergies. We opted to trial over the counter Flonase 1 spray each nostril daily for 2 weeks to see if we can calm this down- can stop after that if gone and restart if recurs. If worsening pattern could refer to ENT or relook in the ears to see if any changes. Her ear canals were perfectly clear today.    Recommended follow up: Return for as needed for new, worsening, persistent symptoms.

## 2024-10-17 ENCOUNTER — Other Ambulatory Visit: Payer: Self-pay | Admitting: Family Medicine

## 2025-02-15 ENCOUNTER — Encounter: Admitting: Family Medicine

## 2025-03-14 ENCOUNTER — Ambulatory Visit: Admitting: Vascular Surgery

## 2025-03-14 ENCOUNTER — Ambulatory Visit (HOSPITAL_COMMUNITY)

## 2025-05-04 ENCOUNTER — Encounter: Admitting: Family Medicine

## 2025-05-28 ENCOUNTER — Ambulatory Visit
